# Patient Record
Sex: Male | Born: 1935 | Hispanic: No | Smoking: Former smoker
Health system: Southern US, Community
[De-identification: ages and names within clinical notes are randomized; demographics above are authoritative.]

## PROBLEM LIST (undated history)

## (undated) DIAGNOSIS — J449 Chronic obstructive pulmonary disease, unspecified: Secondary | ICD-10-CM

## (undated) DIAGNOSIS — I639 Cerebral infarction, unspecified: Secondary | ICD-10-CM

## (undated) DIAGNOSIS — I10 Essential (primary) hypertension: Secondary | ICD-10-CM

## (undated) DIAGNOSIS — I499 Cardiac arrhythmia, unspecified: Secondary | ICD-10-CM

## (undated) DIAGNOSIS — N400 Enlarged prostate without lower urinary tract symptoms: Secondary | ICD-10-CM

## (undated) DIAGNOSIS — I509 Heart failure, unspecified: Secondary | ICD-10-CM

## (undated) DIAGNOSIS — E785 Hyperlipidemia, unspecified: Secondary | ICD-10-CM

## (undated) HISTORY — DX: Cardiac arrhythmia, unspecified: I49.9

## (undated) HISTORY — DX: Chronic obstructive pulmonary disease, unspecified: J44.9

---

## 2018-11-23 ENCOUNTER — Emergency Department: Payer: Self-pay

## 2018-11-23 ENCOUNTER — Emergency Department
Admission: EM | Admit: 2018-11-23 | Discharge: 2018-11-23 | Disposition: A | Discharge status: Home / Self Care | Payer: Self-pay | Attending: Emergency Medicine | Admitting: Emergency Medicine

## 2018-11-23 ENCOUNTER — Other Ambulatory Visit: Payer: Self-pay

## 2018-11-23 DIAGNOSIS — I509 Heart failure, unspecified: Secondary | ICD-10-CM | POA: Insufficient documentation

## 2018-11-23 DIAGNOSIS — R0602 Shortness of breath: Secondary | ICD-10-CM

## 2018-11-23 LAB — CBC
HCT: 37.2 % — ABNORMAL LOW (ref 39.0–52.0)
Hemoglobin: 11.4 g/dL — ABNORMAL LOW (ref 13.0–17.0)
MCH: 25.6 pg — ABNORMAL LOW (ref 26.0–34.0)
MCHC: 30.6 g/dL (ref 30.0–36.0)
MCV: 83.6 fL (ref 80.0–100.0)
Platelets: 117 10*3/uL — ABNORMAL LOW (ref 150–400)
RBC: 4.45 MIL/uL (ref 4.22–5.81)
RDW: 14.6 % (ref 11.5–15.5)
WBC: 6.5 10*3/uL (ref 4.0–10.5)
nRBC: 0 % (ref 0.0–0.2)

## 2018-11-23 LAB — BASIC METABOLIC PANEL
Anion gap: 9 (ref 5–15)
BUN: 27 mg/dL — ABNORMAL HIGH (ref 8–23)
CO2: 32 mmol/L (ref 22–32)
Calcium: 8.8 mg/dL — ABNORMAL LOW (ref 8.9–10.3)
Chloride: 100 mmol/L (ref 98–111)
Creatinine, Ser: 1 mg/dL (ref 0.61–1.24)
GFR calc Af Amer: >60 mL/min (ref >60)
GFR calc non Af Amer: >60 mL/min (ref >60)
Glucose, Bld: 196 mg/dL — ABNORMAL HIGH (ref 70–99)
Potassium: 3.5 mmol/L (ref 3.5–5.1)
Sodium: 141 mmol/L (ref 135–145)

## 2018-11-23 LAB — BRAIN NATRIURETIC PEPTIDE: B Natriuretic Peptide: 484 pg/mL — ABNORMAL HIGH (ref 0.0–100.0)

## 2018-11-23 LAB — PROTIME-INR
INR: 1.4 — ABNORMAL HIGH (ref 0.8–1.2)
Prothrombin Time: 16.8 seconds — ABNORMAL HIGH (ref 11.4–15.2)

## 2018-11-23 LAB — TROPONIN I: Troponin I: <0.03 ng/mL (ref <0.03)

## 2018-11-23 MED ORDER — FUROSEMIDE 10 MG/ML IJ SOLN
40.0000 mg | Freq: Once | INTRAMUSCULAR | Status: AC
  Administered 2018-11-23: 21:00:00 40 mg via INTRAVENOUS
  Filled 2018-11-23: qty 4

## 2018-11-23 NOTE — ED Triage Notes (Signed)
FIRST NURSE NOTE-"full of fluid" per son. Has had SHOB and chest pain. Pt has had stroke and has difficulty speaking and speaks arabic.  Son remains with pt to help communicate.  Pulled for EKG

## 2018-11-23 NOTE — ED Provider Notes (Signed)
[facility] Emergency Department Provider Note   ____________________________________________    I have reviewed the triage vital signs and the nursing notes.   HISTORY  Chief Complaint Shortness of breath  Patient speaks Arabic, son is translating for him  HPI Brian Baker is a 83 y.o. male who presents with complaints of shortness of breath.  Son reports the patient has a history of CHF and over the last 24 hours the patient has felt like he is having difficulty breathing which is especially worse when he lies flat, he had difficulty sleeping last night because of this.  He does wear oxygen at night and supposed to use CPAP but has questionable compliance with this.  Denies fevers or chills.  No recent travel.  No body aches or myalgias.  His cardiologist is in [place]  No past medical history on file.  There are no active problems to display for this patient.     Prior to Admission medications   Not on File     Allergies Patient has no known allergies.  No family history on file.  Social History Social History   Tobacco Use  . Smoking status: Not on file  Substance Use Topics  . Alcohol use: Not on file  . Drug use: Not on file    Review of Systems  Constitutional: No fever/chills Eyes: No visual changes.  ENT: No sore throat. Cardiovascular: Denies chest pain. Respiratory: As above Gastrointestinal: No abdominal pain.  No nausea, no vomiting.   Genitourinary: Negative for dysuria. Musculoskeletal: Negative for back pain. Skin: Negative for rash. Neurological: Negative for headaches   ____________________________________________   PHYSICAL EXAM:  VITAL SIGNS: ED Triage Vitals  Enc Vitals Group     BP 11/23/18 1906 (!) 155/94     Pulse Rate 11/23/18 1906 76     Resp 11/23/18 1906 (!) 22     Temp 11/23/18 1906 97.6 F (36.4 C)     Temp Source 11/23/18 1906 Oral     SpO2 11/23/18 1906 (!) 88 %     Weight 11/23/18  1903 66.5 kg (146 lb 9.7 oz)     Height 11/23/18 1903 1.702 m (5\' 7" )     Head Circumference --      Peak Flow --      Pain Score 11/23/18 1902 0     Pain Loc --      Pain Edu? --      Excl. in GC? --     Constitutional: Alert  Eyes: Conjunctivae are normal.   Nose: No congestion/rhinnorhea. Mouth/Throat: Mucous membranes are moist.    Cardiovascular: Normal rate, regular rhythm. Grossly normal heart sounds.  Good peripheral circulation. Respiratory: Normal respiratory effort.  No retractions. Lungs CTAB. Gastrointestinal: Soft and nontender. No distention.  No CVA tenderness.  Musculoskeletal: No lower extremity tenderness nor edema.  Warm and well perfused Neurologic:  Normal speech and language. No gross focal neurologic deficits are appreciated.  Skin:  Skin is warm, dry and intact. No rash noted.   ____________________________________________   LABS (all labs ordered are listed, but only abnormal results are displayed)  Labs Reviewed  BASIC METABOLIC PANEL - Abnormal; Notable for the following components:      Result Value   Glucose, Bld 196 (*)    BUN 27 (*)    Calcium 8.8 (*)    All other components within normal limits  CBC - Abnormal; Notable for the following components:   Hemoglobin 11.4 (*)  HCT 37.2 (*)    MCH 25.6 (*)    Platelets 117 (*)    All other components within normal limits  PROTIME-INR - Abnormal; Notable for the following components:   Prothrombin Time 16.8 (*)    INR 1.4 (*)    All other components within normal limits  BRAIN NATRIURETIC PEPTIDE - Abnormal; Notable for the following components:   B Natriuretic Peptide 484.0 (*)    All other components within normal limits  TROPONIN I   ____________________________________________  EKG  ED ECG REPORT I, Jene Every, the attending physician, personally viewed and interpreted this ECG.  Date: 11/23/2018  Rhythm: normal sinus rhythm QRS Axis: normal Intervals: normal ST/T Wave  abnormalities: normal Narrative Interpretation: no evidence of acute ischemia  ____________________________________________  RADIOLOGY  X-ray mild vascular congestion ____________________________________________   PROCEDURES  Procedure(s) performed: No  Procedures   Critical Care performed: No ____________________________________________   INITIAL IMPRESSION / ASSESSMENT AND PLAN / ED COURSE  Pertinent labs & imaging results that were available during my care of the patient were reviewed by me and considered in my medical decision making (see chart for details).  Patient presents with shortness of breath, worse with lying flat in the setting of history of CHF is quite suspicious for CHF exacerbation.  Differential also includes pneumonia, novel coronavirus.  Lab work is overall reassuring, chest x-ray demonstrates mild vascular congestion.  Given shortness of breath, room air saturation of 88% and chest x-ray results I recommended IV Lasix and admission to the patient however he flatly refuses to come into the hospital.  On oxygen his pulse ox is 98%, he notes that he has oxygen at home and requests that we give him the IV Lasix now and he can always come back if his symptoms get worse.  Although this plan is suboptimal given his normal oxygen saturations on only 2 L of oxygen we will trial IV Lasix and have the patient go home with very strict return precautions     Onecimo Purchase was evaluated in Emergency Department on 11/23/2018 for the symptoms described in the history of present illness. He was evaluated in the context of the global COVID-19 pandemic, which necessitated consideration that the patient might be at risk for infection with the SARS-CoV-2 virus that causes COVID-19. Institutional protocols and algorithms that pertain to the evaluation of patients at risk for COVID-19 are in a state of rapid change based on information released by regulatory bodies including the CDC and  federal and state organizations. These policies and algorithms were followed during the patient's care in the ED.     ____________________________________________   FINAL CLINICAL IMPRESSION(S) / ED DIAGNOSES  Final diagnoses:  Acute on chronic congestive heart failure, unspecified heart failure type [facility])        Note:  This document was prepared using Dragon voice recognition software and may include unintentional dictation errors.   Jene Every, MD 11/23/18 2154

## 2018-11-23 NOTE — ED Triage Notes (Signed)
Son reports pt unable to sleep last night due liquid in his lungs, states that this has happened before states all day today he has been having bleeding from his rectum and this is new

## 2019-01-24 ENCOUNTER — Inpatient Hospital Stay
Admission: EM | Admit: 2019-01-24 | Discharge: 2019-01-25 | DRG: 292 | Disposition: A | Discharge status: Home / Self Care | Payer: Medicaid Other | Attending: Internal Medicine | Admitting: Internal Medicine

## 2019-01-24 ENCOUNTER — Emergency Department: Payer: Medicaid Other

## 2019-01-24 ENCOUNTER — Other Ambulatory Visit: Payer: Self-pay

## 2019-01-24 DIAGNOSIS — I160 Hypertensive urgency: Secondary | ICD-10-CM | POA: Diagnosis present

## 2019-01-24 DIAGNOSIS — I081 Rheumatic disorders of both mitral and tricuspid valves: Secondary | ICD-10-CM | POA: Diagnosis present

## 2019-01-24 DIAGNOSIS — D649 Anemia, unspecified: Secondary | ICD-10-CM | POA: Diagnosis present

## 2019-01-24 DIAGNOSIS — E876 Hypokalemia: Secondary | ICD-10-CM | POA: Diagnosis present

## 2019-01-24 DIAGNOSIS — I6310 Cerebral infarction due to embolism of unspecified precerebral artery: Secondary | ICD-10-CM | POA: Diagnosis not present

## 2019-01-24 DIAGNOSIS — I444 Left anterior fascicular block: Secondary | ICD-10-CM | POA: Diagnosis present

## 2019-01-24 DIAGNOSIS — R479 Unspecified speech disturbances: Secondary | ICD-10-CM | POA: Diagnosis not present

## 2019-01-24 DIAGNOSIS — Z7901 Long term (current) use of anticoagulants: Secondary | ICD-10-CM

## 2019-01-24 DIAGNOSIS — I361 Nonrheumatic tricuspid (valve) insufficiency: Secondary | ICD-10-CM | POA: Diagnosis not present

## 2019-01-24 DIAGNOSIS — K219 Gastro-esophageal reflux disease without esophagitis: Secondary | ICD-10-CM | POA: Diagnosis present

## 2019-01-24 DIAGNOSIS — I4821 Permanent atrial fibrillation: Secondary | ICD-10-CM | POA: Diagnosis not present

## 2019-01-24 DIAGNOSIS — I5043 Acute on chronic combined systolic (congestive) and diastolic (congestive) heart failure: Secondary | ICD-10-CM | POA: Diagnosis present

## 2019-01-24 DIAGNOSIS — I420 Dilated cardiomyopathy: Secondary | ICD-10-CM | POA: Diagnosis not present

## 2019-01-24 DIAGNOSIS — I110 Hypertensive heart disease with heart failure: Secondary | ICD-10-CM | POA: Diagnosis not present

## 2019-01-24 DIAGNOSIS — I25118 Atherosclerotic heart disease of native coronary artery with other forms of angina pectoris: Secondary | ICD-10-CM | POA: Diagnosis not present

## 2019-01-24 DIAGNOSIS — Z87891 Personal history of nicotine dependence: Secondary | ICD-10-CM

## 2019-01-24 DIAGNOSIS — Z8673 Personal history of transient ischemic attack (TIA), and cerebral infarction without residual deficits: Secondary | ICD-10-CM

## 2019-01-24 DIAGNOSIS — G9389 Other specified disorders of brain: Secondary | ICD-10-CM | POA: Diagnosis present

## 2019-01-24 DIAGNOSIS — I272 Pulmonary hypertension, unspecified: Secondary | ICD-10-CM | POA: Diagnosis present

## 2019-01-24 DIAGNOSIS — Z20828 Contact with and (suspected) exposure to other viral communicable diseases: Secondary | ICD-10-CM | POA: Diagnosis present

## 2019-01-24 DIAGNOSIS — N400 Enlarged prostate without lower urinary tract symptoms: Secondary | ICD-10-CM | POA: Diagnosis present

## 2019-01-24 DIAGNOSIS — I509 Heart failure, unspecified: Secondary | ICD-10-CM

## 2019-01-24 DIAGNOSIS — E785 Hyperlipidemia, unspecified: Secondary | ICD-10-CM | POA: Diagnosis present

## 2019-01-24 DIAGNOSIS — R778 Other specified abnormalities of plasma proteins: Secondary | ICD-10-CM

## 2019-01-24 DIAGNOSIS — I10 Essential (primary) hypertension: Secondary | ICD-10-CM | POA: Diagnosis present

## 2019-01-24 DIAGNOSIS — I340 Nonrheumatic mitral (valve) insufficiency: Secondary | ICD-10-CM | POA: Diagnosis not present

## 2019-01-24 HISTORY — DX: Essential (primary) hypertension: I10

## 2019-01-24 HISTORY — DX: Cerebral infarction, unspecified: I63.9

## 2019-01-24 HISTORY — DX: Benign prostatic hyperplasia without lower urinary tract symptoms: N40.0

## 2019-01-24 HISTORY — DX: Hyperlipidemia, unspecified: E78.5

## 2019-01-24 HISTORY — DX: Heart failure, unspecified: I50.9

## 2019-01-24 LAB — BASIC METABOLIC PANEL
Anion gap: 9 (ref 5–15)
BUN: 26 mg/dL — ABNORMAL HIGH (ref 8–23)
CO2: 29 mmol/L (ref 22–32)
Calcium: 9.1 mg/dL (ref 8.9–10.3)
Chloride: 105 mmol/L (ref 98–111)
Creatinine, Ser: 1.07 mg/dL (ref 0.61–1.24)
GFR calc Af Amer: >60 mL/min (ref >60)
GFR calc non Af Amer: >60 mL/min (ref >60)
Glucose, Bld: 163 mg/dL — ABNORMAL HIGH (ref 70–99)
Potassium: 3.6 mmol/L (ref 3.5–5.1)
Sodium: 143 mmol/L (ref 135–145)

## 2019-01-24 LAB — BRAIN NATRIURETIC PEPTIDE: B Natriuretic Peptide: 955 pg/mL — ABNORMAL HIGH (ref 0.0–100.0)

## 2019-01-24 LAB — CBC
HCT: 39.2 % (ref 39.0–52.0)
Hemoglobin: 12.3 g/dL — ABNORMAL LOW (ref 13.0–17.0)
MCH: 26.1 pg (ref 26.0–34.0)
MCHC: 31.4 g/dL (ref 30.0–36.0)
MCV: 83.2 fL (ref 80.0–100.0)
Platelets: 90 10*3/uL — ABNORMAL LOW (ref 150–400)
RBC: 4.71 MIL/uL (ref 4.22–5.81)
RDW: 15.9 % — ABNORMAL HIGH (ref 11.5–15.5)
WBC: 7.4 10*3/uL (ref 4.0–10.5)
nRBC: 0 % (ref 0.0–0.2)

## 2019-01-24 LAB — TROPONIN I: Troponin I: 0.07 ng/mL (ref <0.03)

## 2019-01-24 MED ORDER — ENOXAPARIN SODIUM 40 MG/0.4ML ~~LOC~~ SOLN: 40.0000 mg | SUBCUTANEOUS | Status: DC

## 2019-01-24 MED ORDER — NITROGLYCERIN 2 % TD OINT
0.5000 [in_us] | TOPICAL_OINTMENT | Freq: Four times a day (QID) | TRANSDERMAL | Status: DC
  Administered 2019-01-24 – 2019-01-25 (×3): 0.5 [in_us] via TOPICAL
  Filled 2019-01-24 (×3): qty 1

## 2019-01-24 MED ORDER — SODIUM CHLORIDE 0.9 % IV SOLN: 250.0000 mL | INTRAVENOUS | Status: DC | PRN

## 2019-01-24 MED ORDER — ASPIRIN EC 81 MG PO TBEC
81.0000 mg | DELAYED_RELEASE_TABLET | Freq: Every day | ORAL | Status: DC
  Filled 2019-01-24: qty 1

## 2019-01-24 MED ORDER — ZOLPIDEM TARTRATE 5 MG PO TABS: 5.0000 mg | ORAL_TABLET | Freq: Every evening | ORAL | Status: DC | PRN

## 2019-01-24 MED ORDER — SODIUM CHLORIDE 0.9% FLUSH: 3.0000 mL | INTRAVENOUS | Status: DC | PRN

## 2019-01-24 MED ORDER — ACETAMINOPHEN 325 MG PO TABS: 650.0000 mg | ORAL_TABLET | ORAL | Status: DC | PRN

## 2019-01-24 MED ORDER — POTASSIUM CHLORIDE 20 MEQ PO PACK
40.0000 meq | PACK | Freq: Once | ORAL | Status: AC
  Administered 2019-01-25: 40 meq via ORAL
  Filled 2019-01-24: qty 2

## 2019-01-24 MED ORDER — ASPIRIN 81 MG PO CHEW
324.0000 mg | CHEWABLE_TABLET | Freq: Once | ORAL | Status: AC
  Administered 2019-01-24: 23:00:00 324 mg via ORAL
  Filled 2019-01-24: qty 4

## 2019-01-24 MED ORDER — FUROSEMIDE 10 MG/ML IJ SOLN
40.0000 mg | Freq: Two times a day (BID) | INTRAMUSCULAR | Status: DC
  Administered 2019-01-25 (×2): 40 mg via INTRAVENOUS
  Filled 2019-01-24: qty 4

## 2019-01-24 MED ORDER — SODIUM CHLORIDE 0.9% FLUSH
3.0000 mL | Freq: Two times a day (BID) | INTRAVENOUS | Status: DC
  Administered 2019-01-25: 3 mL via INTRAVENOUS

## 2019-01-24 MED ORDER — ONDANSETRON HCL 4 MG/2ML IJ SOLN: 4.0000 mg | Freq: Four times a day (QID) | INTRAMUSCULAR | Status: DC | PRN

## 2019-01-24 MED ORDER — FUROSEMIDE 10 MG/ML IJ SOLN
40.0000 mg | Freq: Once | INTRAMUSCULAR | Status: DC
  Filled 2019-01-24: qty 4

## 2019-01-24 NOTE — ED Notes (Signed)
Patient to room 5 with daughter in law. Patient is from [place]. Patient has history of cardiac issues with having heart attack three years ago prior to coming to [place]. Patient states having headache x 3 days and just started having chest pain and some nausea. Patient on 2 liter O2 and same at home.

## 2019-01-24 NOTE — ED Provider Notes (Signed)
[facility] Emergency Department Provider Note ___   First MD Initiated Contact with Patient 01/24/19 2324     (approximate)  I have reviewed the triage vital signs and the nursing notes.  History obtained from the patient's son who interpreted. HISTORY  Chief Complaint Hypertension   HPI Brian Baker is a 83 y.o. male with below list of previous medical conditions including hypertension and CHF presents to the emergency department with progressive dyspnea and chest pain lightheadedness x3 days.  Patient's son also stated that patient has noted hypertension at home.  Patient's blood pressure on arrival to the emergency department 189/139.  Patient with ongoing chest discomfort and dyspnea.  Patient's and also admits that the patient has had orthopnea.        Past Medical History:  Diagnosis Date  . CHF (congestive heart failure) (HCC)   . Enlarged prostate   . Hyperlipemia   . Hypertension   . Stroke [facility])     There are no active problems to display for this patient.   History reviewed. No pertinent surgical history.  Prior to Admission medications   Medication Sig Start Date End Date Taking? Authorizing Provider  albuterol (VENTOLIN HFA) 108 (90 Base) MCG/ACT inhaler Inhale 1-2 puffs into the lungs every 4 (four) hours as needed.  05/27/18 05/27/19 Yes [provider]  ELIQUIS 5 MG TABS tablet TK 1 T PO DAILY 01/09/19   [provider]  finasteride (PROSCAR) 5 MG tablet TK 1 T PO Q 24 H 12/02/18   [provider]  furosemide (LASIX) 40 MG tablet Take 40 mg by mouth daily.  11/26/18   [provider]  lisinopril (ZESTRIL) 10 MG tablet TK 1 T PO  D 12/27/18   [provider]  metoprolol tartrate (LOPRESSOR) 25 MG tablet Take 25 mg by mouth 2 (two) times daily.  12/25/18   [provider]  omeprazole (PRILOSEC) 20 MG capsule Take 20 mg by mouth daily.  12/25/18   [provider]  pravastatin  (PRAVACHOL) 40 MG tablet TK 1 T PO HS 01/04/19   [provider]  tamsulosin (FLOMAX) 0.4 MG CAPS capsule TK ONE C PO QD 10/29/18   [provider]    Allergies Patient has no known allergies.  No family history on file.  Social History Social History   Tobacco Use  . Smoking status: Former Games developer  . Smokeless tobacco: Never Used  Substance Use Topics  . Alcohol use: Not Currently  . Drug use: Not on file    Review of Systems Constitutional: No fever/chills Eyes: No visual changes. ENT: No sore throat. Cardiovascular: Positive for chest pain. Respiratory: Positive for shortness of breath. Gastrointestinal: No abdominal pain.  No nausea, no vomiting.  No diarrhea.  No constipation. Genitourinary: Negative for dysuria. Musculoskeletal: Negative for neck pain.  Negative for back pain. Integumentary: Negative for rash. Neurological: Negative for headaches, focal weakness or numbness.   ____________________________________________   PHYSICAL EXAM:  VITAL SIGNS: ED Triage Vitals  Enc Vitals Group     BP 01/24/19 2141 (!) 189/139     Pulse Rate 01/24/19 2141 100     Resp 01/24/19 2141 19     Temp 01/24/19 2141 98 F (36.7 C)     Temp src --      SpO2 01/24/19 2141 (!) 87 %     Weight 01/24/19 2142 67.1 kg (148 lb)     Height 01/24/19 2142 1.651 m (5\' 5" )  Head Circumference --      Peak Flow --      Pain Score 01/24/19 2142 6     Pain Loc --      Pain Edu? --      Excl. in GC? --     Constitutional: Alert and oriented. Well appearing and in no acute distress. Eyes: Conjunctivae are normal.  Mouth/Throat: Mucous membranes are moist.  Oropharynx non-erythematous. Neck: No stridor. Cardiovascular: Normal rate, regular rhythm. Good peripheral circulation. Grossly normal heart sounds. Respiratory: Tachypnea.  No retractions.  Bibasilar rales Gastrointestinal: Soft and nontender. No distention.  Musculoskeletal: No lower extremity tenderness nor  edema. No gross deformities of extremities. Neurologic:  Normal speech and language. No gross focal neurologic deficits are appreciated.  Skin:  Skin is warm, dry and intact. No rash noted. Psychiatric: Mood and affect are normal. Speech and behavior are normal.  ____________________________________________   LABS (all labs ordered are listed, but only abnormal results are displayed)  Labs Reviewed  BASIC METABOLIC PANEL - Abnormal; Notable for the following components:      Result Value   Glucose, Bld 163 (*)    BUN 26 (*)    All other components within normal limits  CBC - Abnormal; Notable for the following components:   Hemoglobin 12.3 (*)    RDW 15.9 (*)    Platelets 90 (*)    All other components within normal limits  TROPONIN I - Abnormal; Notable for the following components:   Troponin I 0.07 (*)    All other components within normal limits  BRAIN NATRIURETIC PEPTIDE - Abnormal; Notable for the following components:   B Natriuretic Peptide 955.0 (*)    All other components within normal limits   ____________________________________________  EKG  ED ECG REPORT I, Mesilla N Abed Schar, the attending physician, personally viewed and interpreted this ECG.   Date: 01/24/2019  EKG Time: 9:46 PM  Rate: 95  Rhythm: Atrial fibrillation  Axis: Normal  Intervals: Irregular RR interval  ST&T Change: None  ____________________________________________  RADIOLOGY I, Burley N Blaine Guiffre, personally viewed and evaluated these images (plain radiographs) as part of my medical decision making, as well as reviewing the written report by the radiologist.  ED MD interpretation: Cardiomegaly with vascular congestion and small left pleural effusion   Official radiology report(s): Dg Chest Port 1 View  Result Date: 01/24/2019 CLINICAL DATA:  83 year old male with CHF. EXAM: PORTABLE CHEST 1 VIEW COMPARISON:  Chest radiograph dated 11/23/2018 FINDINGS: There is cardiomegaly with vascular  congestion. A small left pleural effusion may be present. No focal consolidation or pneumothorax. Atherosclerotic calcification of the aortic arch. No acute osseous pathology. IMPRESSION: Cardiomegaly with vascular congestion and possible small left pleural effusion. No significant interval change since the prior radiograph. Electronically Signed   By: Elgie CollardArash  Radparvar M.D.   On: 01/24/2019 23:04    .Critical Care Performed by: Darci CurrentBrown, [place] N, MD Authorized by: Darci CurrentBrown, [place] N, MD   Critical care provider statement:    Critical care time (minutes):  30   Critical care time was exclusive of:  Separately billable procedures and treating other patients   Critical care was necessary to treat or prevent imminent or life-threatening deterioration of the following conditions:  Cardiac failure   Critical care was time spent personally by me on the following activities:  Development of treatment plan with patient or surrogate, discussions with consultants, evaluation of patient's response to treatment, examination of patient, obtaining history from patient or surrogate, ordering  and performing treatments and interventions, ordering and review of laboratory studies, ordering and review of radiographic studies, pulse oximetry, re-evaluation of patient's condition and review of old charts   I assumed direction of critical care for this patient from another provider in my specialty: no       ____________________________________________   INITIAL IMPRESSION / MDM / ASSESSMENT AND PLAN / ED COURSE  As part of my medical decision making, I reviewed the following data within the electronic MEDICAL RECORD NUMBER   83 year old male presenting with above-stated history and physical exam concerning for hypertensive emergency with CHF exacerbation.  Half inch Nitropaste applied to the patient's chest Lasix 40 mg IV administered. Patient discussed with Dr. Arville Care for hospital admission for further evaluation and  management of hypertensive emergency/CHF exacerbation.     *Brian Baker was evaluated in Emergency Department on 01/24/2019 for the symptoms described in the history of present illness. He was evaluated in the context of the global COVID-19 pandemic, which necessitated consideration that the patient might be at risk for infection with the SARS-CoV-2 virus that causes COVID-19. Institutional protocols and algorithms that pertain to the evaluation of patients at risk for COVID-19 are in a state of rapid change based on information released by regulatory bodies including the CDC and federal and state organizations. These policies and algorithms were followed during the patient's care in the ED.  Some ED evaluations and interventions may be delayed as a result of limited staffing during the pandemic.*   ____________________________________________  FINAL CLINICAL IMPRESSION(S) / ED DIAGNOSES  Final diagnoses:  Acute on chronic congestive heart failure, unspecified heart failure type (HCC)  Elevated troponin  Malignant hypertension     MEDICATIONS GIVEN DURING THIS VISIT:  Medications  nitroGLYCERIN (NITROGLYN) 2 % ointment 0.5 inch (0.5 inches Topical Given 01/24/19 2321)  furosemide (LASIX) injection 40 mg (has no administration in time range)  aspirin chewable tablet 324 mg (324 mg Oral Given 01/24/19 2320)     ED Discharge Orders    None       Note:  This document was prepared using Dragon voice recognition software and may include unintentional dictation errors.   Darci Current, MD 01/25/19 [phone]

## 2019-01-24 NOTE — ED Triage Notes (Signed)
Patient c/o headache, hypertension ( Systolic BP 180), lightheadedness X 3 days.

## 2019-01-25 ENCOUNTER — Inpatient Hospital Stay: Payer: Medicaid Other

## 2019-01-25 ENCOUNTER — Inpatient Hospital Stay (HOSPITAL_COMMUNITY)
Admit: 2019-01-25 | Discharge: 2019-01-25 | Disposition: A | Discharge status: Home / Self Care | Payer: Medicaid Other | Attending: Family Medicine | Admitting: Family Medicine

## 2019-01-25 ENCOUNTER — Encounter: Payer: Self-pay | Admitting: Physician Assistant

## 2019-01-25 DIAGNOSIS — I420 Dilated cardiomyopathy: Secondary | ICD-10-CM

## 2019-01-25 DIAGNOSIS — R479 Unspecified speech disturbances: Secondary | ICD-10-CM

## 2019-01-25 DIAGNOSIS — I340 Nonrheumatic mitral (valve) insufficiency: Secondary | ICD-10-CM

## 2019-01-25 DIAGNOSIS — I361 Nonrheumatic tricuspid (valve) insufficiency: Secondary | ICD-10-CM

## 2019-01-25 DIAGNOSIS — I6310 Cerebral infarction due to embolism of unspecified precerebral artery: Secondary | ICD-10-CM

## 2019-01-25 DIAGNOSIS — I110 Hypertensive heart disease with heart failure: Principal | ICD-10-CM

## 2019-01-25 DIAGNOSIS — I25118 Atherosclerotic heart disease of native coronary artery with other forms of angina pectoris: Secondary | ICD-10-CM

## 2019-01-25 DIAGNOSIS — I5043 Acute on chronic combined systolic (congestive) and diastolic (congestive) heart failure: Secondary | ICD-10-CM

## 2019-01-25 DIAGNOSIS — I4821 Permanent atrial fibrillation: Secondary | ICD-10-CM

## 2019-01-25 LAB — BASIC METABOLIC PANEL
Anion gap: 10 (ref 5–15)
BUN: 24 mg/dL — ABNORMAL HIGH (ref 8–23)
CO2: 29 mmol/L (ref 22–32)
Calcium: 8.7 mg/dL — ABNORMAL LOW (ref 8.9–10.3)
Chloride: 104 mmol/L (ref 98–111)
Creatinine, Ser: 0.92 mg/dL (ref 0.61–1.24)
GFR calc Af Amer: >60 mL/min (ref >60)
GFR calc non Af Amer: >60 mL/min (ref >60)
Glucose, Bld: 173 mg/dL — ABNORMAL HIGH (ref 70–99)
Potassium: 2.7 mmol/L — CL (ref 3.5–5.1)
Sodium: 143 mmol/L (ref 135–145)

## 2019-01-25 LAB — GLUCOSE, CAPILLARY: Glucose-Capillary: 196 mg/dL — ABNORMAL HIGH (ref 70–99)

## 2019-01-25 LAB — CBC WITH DIFFERENTIAL/PLATELET
Abs Immature Granulocytes: 0.03 10*3/uL (ref 0.00–0.07)
Basophils Absolute: 0 10*3/uL (ref 0.0–0.1)
Basophils Relative: 1 %
Eosinophils Absolute: 0.1 10*3/uL (ref 0.0–0.5)
Eosinophils Relative: 2 %
HCT: 35.9 % — ABNORMAL LOW (ref 39.0–52.0)
Hemoglobin: 11.1 g/dL — ABNORMAL LOW (ref 13.0–17.0)
Immature Granulocytes: 1 %
Lymphocytes Relative: 17 %
Lymphs Abs: 1.1 10*3/uL (ref 0.7–4.0)
MCH: 25.5 pg — ABNORMAL LOW (ref 26.0–34.0)
MCHC: 30.9 g/dL (ref 30.0–36.0)
MCV: 82.5 fL (ref 80.0–100.0)
Monocytes Absolute: 0.6 10*3/uL (ref 0.1–1.0)
Monocytes Relative: 9 %
Neutro Abs: 4.6 10*3/uL (ref 1.7–7.7)
Neutrophils Relative %: 70 %
Platelets: 90 10*3/uL — ABNORMAL LOW (ref 150–400)
RBC: 4.35 MIL/uL (ref 4.22–5.81)
RDW: 15.7 % — ABNORMAL HIGH (ref 11.5–15.5)
WBC: 6.4 10*3/uL (ref 4.0–10.5)
nRBC: 0 % (ref 0.0–0.2)

## 2019-01-25 LAB — MAGNESIUM: Magnesium: 1.8 mg/dL (ref 1.7–2.4)

## 2019-01-25 LAB — ECHOCARDIOGRAM COMPLETE
Height: 65 in
Weight: 2376 oz

## 2019-01-25 LAB — SARS CORONAVIRUS 2 BY RT PCR (HOSPITAL ORDER, PERFORMED IN ~~LOC~~ HOSPITAL LAB): SARS Coronavirus 2: NEGATIVE

## 2019-01-25 MED ORDER — TAMSULOSIN HCL 0.4 MG PO CAPS
0.4000 mg | ORAL_CAPSULE | Freq: Every day | ORAL | Status: DC
  Filled 2019-01-25: qty 1

## 2019-01-25 MED ORDER — LISINOPRIL 5 MG PO TABS
10.0000 mg | ORAL_TABLET | Freq: Every day | ORAL | Status: DC
  Filled 2019-01-25: qty 1

## 2019-01-25 MED ORDER — PRAVASTATIN SODIUM 20 MG PO TABS: 40.0000 mg | ORAL_TABLET | Freq: Every day | ORAL | Status: DC

## 2019-01-25 MED ORDER — LABETALOL HCL 5 MG/ML IV SOLN
20.0000 mg | INTRAVENOUS | Status: DC | PRN
  Administered 2019-01-25: 20 mg via INTRAVENOUS
  Filled 2019-01-25: qty 4

## 2019-01-25 MED ORDER — METOPROLOL TARTRATE 25 MG PO TABS: 25.0000 mg | ORAL_TABLET | Freq: Once | ORAL | Status: DC

## 2019-01-25 MED ORDER — PANTOPRAZOLE SODIUM 40 MG PO TBEC
40.0000 mg | DELAYED_RELEASE_TABLET | Freq: Every day | ORAL | Status: DC
  Filled 2019-01-25: qty 1

## 2019-01-25 MED ORDER — HYDRALAZINE HCL 20 MG/ML IJ SOLN: 10.0000 mg | Freq: Four times a day (QID) | INTRAMUSCULAR | Status: DC | PRN

## 2019-01-25 MED ORDER — APIXABAN 2.5 MG PO TABS
2.5000 mg | ORAL_TABLET | Freq: Two times a day (BID) | ORAL | Status: DC
  Filled 2019-01-25: qty 1

## 2019-01-25 MED ORDER — IOHEXOL 350 MG/ML SOLN
75.0000 mL | Freq: Once | INTRAVENOUS | Status: AC | PRN
  Administered 2019-01-25: 75 mL via INTRAVENOUS

## 2019-01-25 MED ORDER — METOPROLOL TARTRATE 25 MG PO TABS
25.0000 mg | ORAL_TABLET | Freq: Two times a day (BID) | ORAL | Status: DC
  Filled 2019-01-25 (×2): qty 1

## 2019-01-25 MED ORDER — ALBUTEROL SULFATE (2.5 MG/3ML) 0.083% IN NEBU: 2.5000 mg | INHALATION_SOLUTION | RESPIRATORY_TRACT | Status: DC | PRN

## 2019-01-25 MED ORDER — POTASSIUM CHLORIDE CRYS ER 20 MEQ PO TBCR
40.0000 meq | EXTENDED_RELEASE_TABLET | ORAL | Status: AC
  Administered 2019-01-25: 40 meq via ORAL
  Filled 2019-01-25: qty 2

## 2019-01-25 MED ORDER — FINASTERIDE 5 MG PO TABS
5.0000 mg | ORAL_TABLET | Freq: Every day | ORAL | Status: DC
  Filled 2019-01-25: qty 1

## 2019-01-25 NOTE — Consult Note (Signed)
Cardiology Consultation:   Patient ID: Brian Baker MRN: [id number]; DOB: June 17, 1936  Admit date: 01/24/2019 Date of Consult: 01/25/2019  Primary Care Provider: [place], [place] [facility] [place] Primary Cardiologist: [facility], Dr. Lynnea Ferrier Primary Electrophysiologist:  None    Patient Profile:   Brian Baker is a 83 y.o. male with a hx of permanent atrial fibrillation with RVR, CVA (~ 2018 per [facility]), HTN, HLD, chronic combined systolic and diastolic congestive heart failure (EF 50%), moderate MR, former smoker (40pk year history, quit 1995), COPD, and R eye enucleation, GERD, and who is being seen today for the evaluation of possible acute on chronic HFpEF at the request of Dr. Arville Care.  History of Present Illness:   Brian Baker is an 83 yo male with PMH as above.  He has been followed by [facility] / Dr. Lynnea Ferrier with most recent visit to cardiologist 12/25/2018.  He is reportedly a refugee from [place] and moved to [place] for 5 years before he came to the [place] 10/2016.  Per daughter in law, h/o heart attack three years before coming to [place]; however, unable to obtain confirmation of this event at this time.  He also has a known history of CVA dating back to 2018 per review of EMR.  Past surgical history was notable for right eye enucleation of unknown date but notable on physical exam. Also of note, patient requires a Nurse, learning disability.  He was admitted 05/24/2018-05/27/2018 with acute on chronic combined systolic and diastolic heart failure and treated with IV diuretics.  On review of notes, it was thought that he may had underlying COPD at that time, for which he was referred to pulmonology and started on home oxygen.  His diltiazem CD 180 mg daily was discontinued during the admission d/t bradycardic rates and his previous metoprolol changed to Coreg. Echo done at that time (05/26/2018 echo) showed EF low normal at 50%, low normal LV wall motion globally, abnormal diastolic filling pattern consistent with Afib,  severe LAE, mild RAE, moderate MR, mild dilation of the ascending aorta at 3.57cm, and possibly small size of IVC.   As above, he follows with Dr. Lynnea Ferrier of [facility] with most recent follow-up on 12/25/2018. At this visit, it was noted that he was back on his metoprolol tartrate, which was then  increased to metoprolol tartrate 37.5mg  BID for more optimal rate control in the setting of permanent atrial fibrillation. He had not restarted the diltiazem CD 180mg , previously discontinued as above. Recommendations were to continue medication management with his increased metoprolol tartrate 37.5mg  po BID (1.5 tablets twice daily), Eliquis (documented as both 5mg  and 2.5 mg BID under his medications), Lasix 40 mg BID and at 8 AM and 2 PM, lisinopril 10 mg daily, pravastatin 40 mg po each night. Weight at that time was noted to be 135lb (61.3kg).  Of note, the HPI below was obtained through a combination of review of EMR, an interpreter, and family. The patient has been unwilling to answer questions or provide any information regarding his history since in the ED (informed the interpreter he did not want to answer questions at this time). Family has been contacted and some information provided so far by the daughter in law.  Per the patient's daughter-in-law, over the last 3-4 days, the patient had been complaining of chest pain, increasing SOB/DOE, HA, light-headedness, and nausea. On 6/4, he presented to [facility] ED. No reported CP, palpitations, or racing HR. No recent fevers or chills. She also reported bilateral ankle and  pedal edema, however. In the ED, BP 189/139, ventricular rate 100 bpm, SpO2 87% ORA (uses home oxygen per EMR). Labs showed troponin 0.07, Hgb 12.3, K 3.6. BNP 955.0. COVID-19 negative. EKG showed known atrial fibrillation with ventricular rate 95 bpm and no acute changes. CXR showed vascular congestion with possible small L pleural effusion. Physical exam without neuro changes. He was  started on IV lasix  and ASA with nitropaste then admitted for further evaluation and management.  Of note, shortly after today's 6/5 cardiac consultation, this patient was transferred to the ICU. On review of EMR, stroke protocol was initiated d/t RN concern for acute CVA as the patient would not communicate with her and she also noted concern for facial asymmetry (as above, known R eye enucleation). Neuro consulted with workup showing no acute findings and known previous CVA. Family also confirmed patient is at his baseline.  Past Medical History:  Diagnosis Date  . CHF (congestive heart failure) (HCC)   . Enlarged prostate   . Hyperlipemia   . Hypertension   . Stroke [facility])     History reviewed. No pertinent surgical history.   Home Medications:  Prior to Admission medications   Medication Sig Start Date End Date Taking? Authorizing Provider  albuterol (VENTOLIN HFA) 108 (90 Base) MCG/ACT inhaler Inhale 1-2 puffs into the lungs every 4 (four) hours as needed.  05/27/18 05/27/19 Yes [provider]  ELIQUIS 5 MG TABS tablet Take 2.5 mg by mouth 2 (two) times daily.  01/09/19  Yes [provider]  finasteride (PROSCAR) 5 MG tablet TK 1 T PO Q 24 H 12/02/18  Yes [provider]  furosemide (LASIX) 40 MG tablet Take 40 mg by mouth daily.  11/26/18  Yes [provider]  lisinopril (ZESTRIL) 10 MG tablet TK 1 T PO  D 12/27/18  Yes [provider]  pravastatin (PRAVACHOL) 40 MG tablet TK 1 T PO HS 01/04/19  Yes [provider]  tamsulosin (FLOMAX) 0.4 MG CAPS capsule TK ONE C PO QD 10/29/18  Yes [provider]  metoprolol tartrate (LOPRESSOR) 25 MG tablet Take 25 mg by mouth 2 (two) times daily.  12/25/18   [provider]  omeprazole (PRILOSEC) 20 MG capsule Take 20 mg by mouth daily.  12/25/18   [provider]    Inpatient Medications: Scheduled Meds: . apixaban  2.5 mg Oral BID  . aspirin EC  81 mg Oral Daily  .  finasteride  5 mg Oral Daily  . furosemide  40 mg Intravenous Once  . furosemide  40 mg Intravenous Q12H  . lisinopril  10 mg Oral Daily  . metoprolol tartrate  25 mg Oral BID  . nitroGLYCERIN  0.5 inch Topical Q6H  . pantoprazole  40 mg Oral Daily  . potassium chloride  40 mEq Oral Q2H  . pravastatin  40 mg Oral Daily  . sodium chloride flush  3 mL Intravenous Q12H  . tamsulosin  0.4 mg Oral Daily   Continuous Infusions: . sodium chloride     PRN Meds: sodium chloride, acetaminophen, albuterol, hydrALAZINE, labetalol, ondansetron (ZOFRAN) IV, sodium chloride flush, zolpidem  Allergies:   No Known Allergies  Social History:   Social History   Socioeconomic History  . Marital status: Widowed    Spouse name: Not on file  . Number of children: Not on file  . Years of education: Not on file  . Highest education level: Not on file  Occupational History  . Not  on file  Social Needs  . Financial resource strain: Not on file  . Food insecurity:    Worry: Not on file    Inability: Not on file  . Transportation needs:    Medical: Not on file    Non-medical: Not on file  Tobacco Use  . Smoking status: Former Games developermoker  . Smokeless tobacco: Never Used  Substance and Sexual Activity  . Alcohol use: Not Currently  . Drug use: Not on file  . Sexual activity: Not on file  Lifestyle  . Physical activity:    Days per week: Not on file    Minutes per session: Not on file  . Stress: Not on file  Relationships  . Social connections:    Talks on phone: Not on file    Gets together: Not on file    Attends religious service: Not on file    Active member of club or organization: Not on file    Attends meetings of clubs or organizations: Not on file    Relationship status: Not on file  . Intimate partner violence:    Fear of current or ex partner: Not on file    Emotionally abused: Not on file    Physically abused: Not on file    Forced sexual activity: Not on file  Other Topics  Concern  . Not on file  Social History Narrative  . Not on file    Family History:   History reviewed. No pertinent family history.  No known cardiac family history.  ROS:  Please see the history of present illness.  Review of Systems  Constitutional: Negative for chills and fever.  Respiratory: Positive for cough and shortness of breath.        Cough d/t GERD and improving after PPI prescribed by his primary cardiologist  Cardiovascular: Positive for leg swelling. Negative for chest pain.  Gastrointestinal: Positive for heartburn and nausea.  Neurological: Positive for dizziness and headaches.  All other systems reviewed and are negative.   All other ROS reviewed and negative.     Physical Exam/Data:   Vitals:   01/25/19 0136 01/25/19 0329 01/25/19 0400 01/25/19 0807  BP: (!) 157/111 (!) 166/106 134/87 (!) 147/98  Pulse: 75 96 77 77  Resp:  17  16  Temp:  98.3 F (36.8 C)  (!) 97.5 F (36.4 C)  TempSrc:  Oral  Oral  SpO2:  93%  93%  Weight: 67.4 kg     Height: 5\' 5"  (1.651 m)       Intake/Output Summary (Last 24 hours) at 01/25/2019 0840 Last data filed at 01/25/2019 0600 Gross per 24 hour  Intake -  Output 650 ml  Net -650 ml   Filed Weights   01/24/19 2142 01/25/19 0136  Weight: 67.1 kg 67.4 kg   Body mass index is 24.71 kg/m.  General:  Well nourished, well developed, in no acute distress HEENT: normal. Known R eye enucleation  Neck: +JVD, JVP ~12cm Vascular: No carotid bruits; Radial pulses 2+ bilaterally  Cardiac:  IRIR; 1/6 systolic murmur Lungs:  Reduced bibasilar breath sounds with trace bibasilar crackles Abd: soft, nontender, no hepatomegaly  Ext: Minimal bilateral lower extremity edema Musculoskeletal:  No deformities, BUE and BLE strength normal and equal Skin: warm and dry  Neuro:  CNs 2-12 intact, no focal abnormalities noted that were not present at baseline Psych:  Normal affect   EKG:  The EKG was personally reviewed and demonstrates:  Atrial fibrillation Telemetry:  Telemetry  was personally reviewed and demonstrates: IRIR  Relevant CV Studies:  TTE 01/25/2019 The left ventricle has moderately reduced systolic function, with an ejection fraction of 35-40%. The cavity size was normal. There is moderately increased left ventricular wall thickness. Left ventricular diastolic Doppler parameters are  indeterminate.Global hypokinesis, select images suggests significant inferior wall hypokinesis.  2. The right ventricle has mildly reduced systolic function. The cavity was normal. There is no increase in right ventricular wall thickness. Right ventricular systolic pressure is severely elevated with an estimated pressure of 62.9 mmHg.  3. Left atrial size was severely dilated.  4. Right atrial size was moderately dilated.  5. Mitral valve regurgitation is mild to moderate  6. Tricuspid valve regurgitation is mild-moderate.  TTE 05/2018 Summary:  1. There is slightly decreased left ventricular systolic function. --with low normal LV wall motion globally 2. The ejection fraction is estimated to be 50%. 3. Abnormal diastolic filling pattern consistent with underlying atrial fibrillation was observed. 4. The left atrium is moderate to severely dilated.  5. The right atrium is mildly dilated. 6. There is moderate mitral regurgitation observed. --jet poorly visualized, 2 jets 7. There is mild dilatation of the ascending aorta. --at 3.57 cm 8. The inferior vena cava is not visualized. --possibly small in sizeSummary:  1. There is slightly decreased left ventricular systolic function. --with low normal LV wall motion globally 2. The ejection fraction is estimated to be 50%. 3. Abnormal diastolic filling pattern consistent with underlying atrial fibrillation was observed. 4. The left atrium is moderate to severely dilated.  5. The right atrium is mildly dilated. 6. There is moderate mitral regurgitation observed. --jet poorly  visualized, 2 jets 7. There is mild dilatation of the ascending aorta. --at 3.57 cm 8. The inferior vena cava is not visualized. --possibly small in size  Echo with bubble study 12/11/2016 Summary:  1. No pulmonary hypertension is noted. 2. The inferior vena cava appears normal suggesting normal central venous pressure. 3. There is mildly decreased left ventricular systolic function. 4. Mild global hypokinesis of the left ventricle is observed. 5. The EF is estimated at 40-45%. 6. The right atrium is mildly dilated. 7. The left atrium is mild to moderately dilated.  8. There is moderate mitral regurgitation observed. 9. There is no evidence of a patent foramen ovale by color Doppler and agitated saline contrast.  10. The left ventricle mass index and the relative wall thickness values indicate concentric hypertrophy.  Laboratory Data:  Chemistry Recent Labs  Lab 01/24/19 2150 01/25/19 0459  NA 143 143  K 3.6 2.7*  CL 105 104  CO2 29 29  GLUCOSE 163* 173*  BUN 26* 24*  CREATININE 1.07 0.92  CALCIUM 9.1 8.7*  GFRNONAA >60 >60  GFRAA >60 >60  ANIONGAP 9 10    No results for input(s): PROT, ALBUMIN, AST, ALT, ALKPHOS, BILITOT in the last 168 hours. Hematology Recent Labs  Lab 01/24/19 2150 01/25/19 0459  WBC 7.4 6.4  RBC 4.71 4.35  HGB 12.3* 11.1*  HCT 39.2 35.9*  MCV 83.2 82.5  MCH 26.1 25.5*  MCHC 31.4 30.9  RDW 15.9* 15.7*  PLT 90* 90*   Cardiac Enzymes Recent Labs  Lab 01/24/19 2150  TROPONINI 0.07*   No results for input(s): TROPIPOC in the last 168 hours.  BNP Recent Labs  Lab 01/24/19 2150  BNP 955.0*    DDimer No results for input(s): DDIMER in the last 168 hours.  Radiology/Studies:  Dg Chest Port 1 [address]  Result Date: 01/24/2019 CLINICAL DATA:  83 year old male with CHF. EXAM: PORTABLE CHEST 1 VIEW COMPARISON:  Chest radiograph dated 11/23/2018 FINDINGS: There is cardiomegaly with vascular congestion. A small left pleural effusion may be  present. No focal consolidation or pneumothorax. Atherosclerotic calcification of the aortic arch. No acute osseous pathology. IMPRESSION: Cardiomegaly with vascular congestion and possible small left pleural effusion. No significant interval change since the prior radiograph. Electronically Signed   By: Elgie Collard M.D.   On: 01/24/2019 23:04    Assessment and Plan:   Acute on chronic HFrEF, pulmonary HTN -Progressive shortness of breath, bilateral lower extremity edema, DOE reported per daughter-in-law. -Volume overloaded on exam. BNP elevated at 955.0.  -6/5 echo shows new and moderately reduced systolic function with EF 35 to 40%, moderately increased left ventricular wall thickness, global hypokinesis with some images suggestive of significant inferior wall hypokinesis, RVSP 62.98mmHg, severe LAE, moderate RAE, mild to moderate mitral regurgitation, mild to moderate tricuspid valve regurgitation. -Continue diuresis with IV lasix.  Monitor I's/O's.  Daily standing weights.  Titrate IV lasix as needed for optimal output and until euvolemic on exam. Of note, 12/21/2016 weight documented at 61.3 kg (135 pounds 0.5 ounces). - Will need oral lasix at discharge with potassium supplementation of KCl tab bid in addition to PTA Lasix 40 mg BID and at 8 AM and 2 PM.   -Daily BMET to monitor renal function and electrolytes. Significant hypokalemia with potassium 2.7.  Replete with goal 4.0. Cr 0.92, BUN 24. Recommend KCl x2 in addition to previous dose administered earlier today. -Continue PTA metoprolol tartrate 37.5 mg p.o. twice daily and lisinopril  daily. Given reduced EF as above, consider spironolactone given this is a potassium sparing diuretic.  Given his history of elevated blood pressure, consider also increasing lisinopril to lisinopril 20 mg daily for further BP control.  ICM, consider CAD -Daughter-in-law, previous chest pain leading up to admission -Patient not agreeable to  further ischemic work-up this admission.   -Recommend future ischemic work-up with cardiac catheterization given decreased EF and hypokinesis as above.   -For now, continue medical management as above with BB and ACE.  As above, consider increasing to lisinopril 20 mg daily and addition of spironolactone given hypokalemia and reduced EF.  Aggressive risk factor modification recommended, including statin therapy.  MR/TR - Recommend continue to monitor with periodic echo  Hypokalemia - K 2.7. Replete with goal 4.0 as above. - Mg 1.8 with goal 2.0.  - Repeat BMET following repletion to ensure at goal -That her spironolactone as above.  HTN -SBP into the high 180s - 190s - Nursing notes indicating daughter in law reported lightheaded for three days and HA -Consider increasing lisinopril to 20 mg daily for further BP support.   - Consider spironolactone.   - Continue lasix and BB.   HLD -Continue statin therapy with pravastatin 40 mg p.o. each night -11/2016 LDL 54 with total cholesterol 107.  Goal LDL below 70.  Permanent Afib with RVR - Remains on anticoagulation but documentation of reduced Eliquis dose with patient not meeting 2 of 3 criteria for reduce dose. Recommend continue full dose Eliquis  BID as does not meet the criteria for reduced dosage.  - CHA2DS2VASc score of at least 7 (CHF, HTN, agex2, strokex2, vascular). Recommend A1C lab as well for further risk stratification and as cannot find an A1C in records.  - TSH 05/2018 4.05.  - As above, neurology consulted and r/o acute CVA. Old  CVA noted in imaging as noted in history above. Family confirms at his baseline. -Continue rate control with metoprolol as above and titrate as needed for optimal rate control.   Anemia - Daily CCB - Hgb 11.1 with 05/2018 Hgb 13.8 - Recommend close monitoring of hemoglobin on anticoagulation. Consider workup for acute bleed if any sudden drops and recommend transfusion below 8.  Per IM.    For questions or updates, please contact [facility] Please consult [email or web address] for contact info under     Signed, Lennon Alstrom, PA-C  01/25/2019 8:40 AM

## 2019-01-25 NOTE — Plan of Care (Signed)
  Problem: Activity: Goal: Risk for activity intolerance will decrease Outcome: Progressing   Problem: Elimination: Goal: Will not experience complications related to urinary retention Outcome: Progressing   Problem: Pain Managment: Goal: General experience of comfort will improve Outcome: Progressing   Problem: Safety: Goal: Ability to remain free from injury will improve Outcome: Progressing   Problem: Skin Integrity: Goal: Risk for impaired skin integrity will decrease Outcome: Progressing   

## 2019-01-25 NOTE — Consult Note (Signed)
Referring Physician: Allena Katz    Chief Complaint: Facial droop and loss of speech  HPI: Brian Baker is an 83 y.o. male with a history of afib on Eliquis who was at baseline with medication administration this morning.  Was found later this morning unable to speak and with facial weakness.  Interpreter obtained and patient reports that he was just tired and did not want to say anything.  Son present at this time and repots that he is at baseline.  Initial NIHSS of 3.   Patient wit history of stroke.    Date last known well: Date: 01/25/2019 Time last known well: Time: 09:00 tPA Given: No: Patient on Eliquis, resolution of symptoms  Past Medical History:  Diagnosis Date  . CHF (congestive heart failure) (HCC)   . Enlarged prostate   . Hyperlipemia   . Hypertension   . Stroke [facility])     Surgical history: Artificial eye  History reviewed. No pertinent family history.   Social History:  reports that he has quit smoking. He has never used smokeless tobacco. He reports previous alcohol use. No history on file for drug.  Allergies: No Known Allergies  Medications:  I have reviewed the patient's current medications. Prior to Admission:  Medications Prior to Admission  Medication Sig Dispense Refill Last Dose  . albuterol (VENTOLIN HFA) 108 (90 Base) MCG/ACT inhaler Inhale 1-2 puffs into the lungs every 4 (four) hours as needed.    prn at prn  . ELIQUIS 5 MG TABS tablet Take 2.5 mg by mouth 2 (two) times daily.    01/24/2019 at 0800  . finasteride (PROSCAR) 5 MG tablet TK 1 T PO Q 24 H   01/24/2019 at Unknown time  . furosemide (LASIX) 40 MG tablet Take 40 mg by mouth daily.    01/24/2019 at Unknown time  . lisinopril (ZESTRIL) 10 MG tablet TK 1 T PO  D   01/24/2019 at Unknown time  . pravastatin (PRAVACHOL) 40 MG tablet TK 1 T PO HS   01/24/2019 at Unknown time  . tamsulosin (FLOMAX) 0.4 MG CAPS capsule TK ONE C PO QD   01/24/2019 at Unknown time  . metoprolol tartrate (LOPRESSOR) 25 MG tablet Take 25  mg by mouth 2 (two) times daily.    Not Taking at Unknown time  . omeprazole (PRILOSEC) 20 MG capsule Take 20 mg by mouth daily.    Not Taking at Unknown time   Scheduled: . apixaban  2.5 mg Oral BID  . aspirin EC  81 mg Oral Daily  . finasteride  5 mg Oral Daily  . furosemide  40 mg Intravenous Q12H  . lisinopril  10 mg Oral Daily  . metoprolol tartrate  25 mg Oral BID  . nitroGLYCERIN  0.5 inch Topical Q6H  . pantoprazole  40 mg Oral Daily  . pravastatin  40 mg Oral Daily  . sodium chloride flush  3 mL Intravenous Q12H  . tamsulosin  0.4 mg Oral Daily    ROS: History obtained from the patient  General ROS: negative for - chills, fatigue, fever, night sweats, weight gain or weight loss Psychological ROS: negative for - behavioral disorder, hallucinations, memory difficulties, mood swings or suicidal ideation Ophthalmic ROS: negative for - blurry vision, double vision, eye pain or loss of vision ENT ROS: negative for - epistaxis, nasal discharge, oral lesions, sore throat, tinnitus or vertigo Allergy and Immunology ROS: negative for - hives or itchy/watery eyes Hematological and Lymphatic ROS: negative for - bleeding  problems, bruising or swollen lymph nodes Endocrine ROS: negative for - galactorrhea, hair pattern changes, polydipsia/polyuria or temperature intolerance Respiratory ROS: negative for - cough, hemoptysis, shortness of breath or wheezing Cardiovascular ROS: negative for - chest pain, dyspnea on exertion, edema or irregular heartbeat Gastrointestinal ROS: negative for - abdominal pain, diarrhea, hematemesis, nausea/vomiting or stool incontinence Genito-Urinary ROS: negative for - dysuria, hematuria, incontinence or urinary frequency/urgency Musculoskeletal ROS: negative for - joint swelling or muscular weakness Neurological ROS: as noted in HPI Dermatological ROS: negative for rash and skin lesion changes  Physical Examination: Blood pressure (!) 146/92, pulse 73,  temperature 97.8 F (36.6 C), temperature source Oral, resp. rate 16, height 5\' 5"  (1.651 m), weight 67.4 kg, SpO2 94 %.  HEENT-  Normocephalic, no lesions, without obvious abnormality.  Normal external eye and conjunctiva.  Normal TM's bilaterally.  Normal auditory canals and external ears. Normal external nose, mucus membranes and septum.  Normal pharynx. Cardiovascular- S1, S2 normal, pulses palpable throughout   Lungs- chest clear, no wheezing, rales, normal symmetric air entry Abdomen- soft, non-tender; bowel sounds normal; no masses,  no organomegaly Extremities- no edema Lymph-no adenopathy palpable Musculoskeletal-no joint tenderness, deformity or swelling Skin-warm and dry, no hyperpigmentation, vitiligo, or suspicious lesions  Neurological Examination   Mental Status: Alert, oriented, thought content appropriate.  Speech fluent but slurred.  Has own dialect of Arabic.  Able to follow 3 step commands with some reinforcement Cranial Nerves: II: Does not blink with right confrontation, pupils unreactive III,IV, VI: right ptosis, extra-ocular motions intact in the left eye V,VII: right facial droop, facial light touch sensation normal bilaterally VIII: hearing normal bilaterally IX,X: gag reflex present XI: bilateral shoulder shrug XII: midline tongue extension Motor: Right : Upper extremity   5/5    Left:     Upper extremity   5/5  Lower extremity   5/5     Lower extremity   5/5 Tone and bulk:normal tone throughout; no atrophy noted Sensory: Pinprick and light touch intact throughout, bilaterally Deep Tendon Reflexes: Symmetric throughout Plantars: Right: mute   Left: mute Cerebellar: Normal finger-to-nose and normal heel-to-shin testing bilaterally Gait: not tested due to safety concerns  Laboratory Studies:  Basic Metabolic Panel: Recent Labs  Lab 01/24/19 2150 01/25/19 0459 01/25/19 0615  NA 143 143  --   K 3.6 2.7*  --   CL 105 104  --   CO2 29 29  --    GLUCOSE 163* 173*  --   BUN 26* 24*  --   CREATININE 1.07 0.92  --   CALCIUM 9.1 8.7*  --   MG  --   --  1.8    Liver Function Tests: No results for input(s): AST, ALT, ALKPHOS, BILITOT, PROT, ALBUMIN in the last 168 hours. No results for input(s): LIPASE, AMYLASE in the last 168 hours. No results for input(s): AMMONIA in the last 168 hours.  CBC: Recent Labs  Lab 01/24/19 2150 01/25/19 0459  WBC 7.4 6.4  NEUTROABS  --  4.6  HGB 12.3* 11.1*  HCT 39.2 35.9*  MCV 83.2 82.5  PLT 90* 90*    Cardiac Enzymes: Recent Labs  Lab 01/24/19 2150  TROPONINI 0.07*    BNP: Invalid input(s): POCBNP  CBG: Recent Labs  Lab 01/25/19 1200  GLUCAP 196*    Microbiology: Results for orders placed or performed during the hospital encounter of 01/24/19  SARS Coronavirus 2 (CEPHEID - [facility] Order  Status: None   Collection Time: 01/25/19 12:14 AM  Result Value Ref Range Status   SARS Coronavirus 2 NEGATIVE NEGATIVE Final    Comment: (NOTE) If result is NEGATIVE SARS-CoV-2 target nucleic acids are NOT DETECTED. The SARS-CoV-2 RNA is generally detectable in upper and lower  respiratory specimens during the acute phase of infection. The lowest  concentration of SARS-CoV-2 viral copies this assay can detect is 250  copies / mL. A negative result does not preclude SARS-CoV-2 infection  and should not be used as the sole basis for treatment or other  patient management decisions.  A negative result may occur with  improper specimen collection / handling, submission of specimen other  than nasopharyngeal swab, presence of viral mutation(s) within the  areas targeted by this assay, and inadequate number of viral copies  (<250 copies / mL). A negative result must be combined with clinical  observations, patient history, and epidemiological information. If result is POSITIVE SARS-CoV-2 target nucleic acids are DETECTED. The SARS-CoV-2 RNA is  generally detectable in upper and lower  respiratory specimens dur ing the acute phase of infection.  Positive  results are indicative of active infection with SARS-CoV-2.  Clinical  correlation with patient history and other diagnostic information is  necessary to determine patient infection status.  Positive results do  not rule out bacterial infection or co-infection with other viruses. If result is PRESUMPTIVE POSTIVE SARS-CoV-2 nucleic acids MAY BE PRESENT.   A presumptive positive result was obtained on the submitted specimen  and confirmed on repeat testing.  While 2019 novel coronavirus  (SARS-CoV-2) nucleic acids may be present in the submitted sample  additional confirmatory testing may be necessary for epidemiological  and / or clinical management purposes  to differentiate between  SARS-CoV-2 and other Sarbecovirus currently known to infect humans.  If clinically indicated additional testing with an alternate test  methodology [phone]) is advised. The SARS-CoV-2 RNA is generally  detectable in upper and lower respiratory sp ecimens during the acute  phase of infection. The expected result is Negative. Fact Sheet for Patients:  BoilerBrush.com.cy Fact Sheet for Healthcare Providers: https://pope.com/ This test is not yet approved or cleared by the [place] FDA and has been authorized for detection and/or diagnosis of SARS-CoV-2 by FDA under an Emergency Use Authorization (EUA).  This EUA will remain in effect (meaning this test can be used) for the duration of the COVID-19 declaration under Section 564(b)(1) of the Act, 21 U.S.C. section 360bbb-3(b)(1), unless the authorization is terminated or revoked sooner. Performed at [facility], [address] [place], [place] [address]     Coagulation Studies: No results for input(s): LABPROT, INR in the last 72 hours.  Urinalysis: No results for input(s):  COLORURINE, LABSPEC, PHURINE, GLUCOSEU, HGBUR, BILIRUBINUR, KETONESUR, PROTEINUR, UROBILINOGEN, NITRITE, LEUKOCYTESUR in the last 168 hours.  Invalid input(s): APPERANCEUR  Lipid Panel: No results found for: CHOL, TRIG, HDL, CHOLHDL, VLDL, LDLCALC  HgbA1C: No results found for: HGBA1C  Urine Drug Screen:  No results found for: LABOPIA, COCAINSCRNUR, LABBENZ, AMPHETMU, THCU, LABBARB  Alcohol Level: No results for input(s): ETH in the last 168 hours.  Other results: EKG: atrial fibrillation, rate 95 bpm.  Imaging: Dg Chest Port 1 View  Result Date: 01/24/2019 CLINICAL DATA:  83 year old male with CHF. EXAM: PORTABLE CHEST 1 VIEW COMPARISON:  Chest radiograph dated 11/23/2018 FINDINGS: There is cardiomegaly with vascular congestion. A small left pleural effusion may be present. No focal consolidation or pneumothorax. Atherosclerotic calcification of the  aortic arch. No acute osseous pathology. IMPRESSION: Cardiomegaly with vascular congestion and possible small left pleural effusion. No significant interval change since the prior radiograph. Electronically Signed   By: Elgie Collard M.D.   On: 01/24/2019 23:04   Ct Head Code Stroke Wo Contrast`  Result Date: 01/25/2019 CLINICAL DATA:  Code stroke. 83 year old male with ataxia, loss of speech, right facial droop noticed at 0900 hours. EXAM: CT HEAD WITHOUT CONTRAST TECHNIQUE: Contiguous axial images were obtained from the base of the skull through the vertex without intravenous contrast. COMPARISON:  None. FINDINGS: Brain: Confluent hypodensity in the anterior left MCA territory, extending back toward the motor strip superiorly (series 2, image 24). But this has a fairly chronic appearance and there is some ex vacuo enlargement of the left lateral ventricle. Superimposed bilateral deep gray matter nuclei heterogeneity. No acute intracranial hemorrhage identified. No midline shift, mass effect, or evidence of intracranial mass lesion. No  ventriculomegaly. No other cortically based infarct changes. Vascular: Calcified atherosclerosis at the skull base. No suspicious intracranial vascular hyperdensity. Skull: Negative. Sinuses/Orbits: Left maxillary mucoperiosteal thickening with mild bubbly opacity. Remaining sinuses, tympanic cavities and mastoids are well pneumatized. Other: Surgically absent right globe with prosthesis. No acute orbit or scalp soft tissue finding. ASPECTS [facility] Stroke Program Early CT Score) Total score (0-10 with 10 being normal): Probably 10; largely chronic appearing encephalomalacia in the left MCA territory. IMPRESSION: 1. Probably chronic left MCA territory infarct. No acute intracranial hemorrhage or mass effect. 2. ASPECTS probably 10; largely chronic appearing encephalomalacia in the left MCA territory. Electronically Signed   By: Odessa Fleming M.D.   On: 01/25/2019 12:04    Assessment: 83 y.o. male with a history of afib on Eliquis who today was not responding verbally.  Was noted to have worsening of facial droop as well.  Code stroke initiated.  Head CT reviewed and shows chronic infarcts but no acute changes.  No evidence of hemorrhage.  CTA shows no evidence of large vessel occlusion.  Patient not a tPA candidate due to having received Eliquis in the past 48 hours and with improved exam patient not an intervention candidate.  Patient seen by family and felt to be at baseline at this time.  Initial NIHSS of 3.   Echocardiogram shows no cardiac source of emboli with an EF of 35-40%.    Stroke Risk Factors - hyperlipidemia and hypertension  Plan: 1. HgbA1c, fasting lipid panel 2. PT consult, OT consult, Speech consult 3. Prophylactic therapy-Continue Eliquis 4. NPO until RN stroke swallow screen 5. Telemetry monitoring 6. Frequent neuro checks    Thana Farr, MD Neurology [phone] 01/25/2019, 12:09 PM

## 2019-01-25 NOTE — H&P (Addendum)
[facility] [place] [facility]   PATIENT NAME: Brian Baker    MR#:  [id number]  DATE OF BIRTH:  01-30-1936  DATE OF ADMISSION:  01/24/2019  PRIMARY CARE PHYSICIAN: Huguley Nation, MD    REQUESTING/REFERRING PHYSICIAN: Darci Current, MD  CHIEF COMPLAINT:   Chief Complaint  Patient presents with  . Hypertension    HISTORY OF PRESENT ILLNESS:  Brian Baker  is a 83 y.o. [place] male with a known history of chronic systolic and diastolic CHF, who presented to the emergency room with acute onset of elevated blood pressure and worsening dyspnea since this evening with associated orthopnea and paroxysmal nocturnal dyspnea.  No chest pain or palpitations.  No fever or chills.  No dysuria, oliguria or hematuria or flank pain.  He has been having bilateral ankle and pedal edema.  No fever or chills.  No recent sick exposure.  Upon presentation to the emergency room, his blood pressure was 189/139 with a heart rate of 100 and pulse ox of 87% on room air, respiratory to 19 and temperature 98.  Labs were remarkable for borderline potassium of 3.6 and a BNP of 955.  His COVID-19 test came back negative.  EKG showed atrial fibrillation with controlled ventricular spots of 95 with PVCs and inferolateral T wave inversion.  Portable chest x-ray revealed cardiomegaly with vascular congestion and possible small left pleural effusion.  He was given half an inch of Nitropaste well his 40 mg of IV Lasix and 4 baby aspirin.  He will be admitted to a telemetry bed for further evaluation and management.   PAST MEDICAL HISTORY:   Past Medical History:  Diagnosis Date  . CHF (congestive heart failure) (HCC)   . Enlarged prostate   . Hyperlipemia   . Hypertension   . 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 Smoking status: Former Games developer  . Smokeless tobacco: Never Used  Substance Use Topics  . Alcohol use: Not Currently    FAMILY HISTORY:  No remarkable familial diseases  DRUG ALLERGIES:  No Known Allergies  REVIEW OF SYSTEMS:   ROS As per history of present illness. All pertinent systems were reviewed above. Constitutional,  HEENT, cardiovascular, respiratory, GI, GU, musculoskeletal, neuro, psychiatric, endocrine,  integumentary and hematologic systems were reviewed and are otherwise  negative/unremarkable except for positive findings mentioned above in the HPI.   MEDICATIONS AT HOME:   Prior to Admission medications   Medication Sig Start Date End Date Taking? Authorizing Provider  albuterol (VENTOLIN HFA) 108 (90 Base) MCG/ACT inhaler Inhale 1-2 puffs into the lungs every 4 (four) hours as needed.  05/27/18 05/27/19 Yes [provider]  ELIQUIS 5 MG TABS tablet Take 2.5 mg by mouth 2 (two) times daily.  01/09/19  Yes [provider]  finasteride (PROSCAR) 5 MG tablet TK 1 T PO Q 24 H 12/02/18  Yes [provider]  furosemide (LASIX) 40 MG tablet Take 40 mg by mouth daily.  11/26/18  Yes [provider]  lisinopril (ZESTRIL) 10 MG tablet TK 1 T PO  D 12/27/18  Yes [provider]  pravastatin (PRAVACHOL) 40 MG tablet TK 1 T PO HS 01/04/19  Yes [provider]  tamsulosin (FLOMAX) 0.4 MG CAPS  capsule TK ONE C PO QD 10/29/18  Yes [provider]  metoprolol tartrate (LOPRESSOR) 25 MG tablet Take 25 mg by mouth 2 (two) times daily.  12/25/18   [provider]  omeprazole (PRILOSEC) 20 MG capsule Take 20 mg by mouth daily.  12/25/18   [provider]      VITAL SIGNS:  Blood pressure (!) 169/127, pulse 87, temperature 98 F (36.7 C), resp. rate (!) 22, height  (1.651 m), weight 67.1 kg, SpO2 97 %.  PHYSICAL EXAMINATION:   Physical Exam  GENERAL:  83 y.o.-year-old [place] male patient lying in the bed in mild respiratory distress with conversational dyspnea. EYES: Pupils equal, round, reactive to light and accommodation. No scleral icterus. Extraocular muscles intact.  HEENT: Head atraumatic, normocephalic. Oropharynx and nasopharynx clear.  NECK:  Supple, no jugular venous distention. No thyroid enlargement, no tenderness.  LUNGS: Diminished bibasilar breath sounds with bibasilar rales. CARDIOVASCULAR: Regular rate and rhythm, S1, S2 normal. No murmurs, rubs, or gallops.  ABDOMEN: Soft, nondistended, nontender. Bowel sounds present. No organomegaly or mass.  EXTREMITIES: No pedal edema, cyanosis, or clubbing.  NEUROLOGIC: Cranial nerves II through XII are intact. Muscle strength 5/5 in all extremities. Sensation intact. Gait not checked.  PSYCHIATRIC: The patient is alert and oriented x 3.  Normal affect and good eye contact. SKIN: No obvious rash, lesion, or ulcer.   LABORATORY PANEL:   CBC Recent Labs  Lab 01/24/19 2150  WBC 7.4  HGB 12.3*  HCT 39.2  PLT 90*   ------------------------------------------------------------------------------------------------------------------  Chemistries  Recent Labs  Lab 01/24/19 2150  NA 143  K 3.6  CL 105  CO2 29  GLUCOSE 163*  BUN 26*  CREATININE 1.07  CALCIUM 9.1   ------------------------------------------------------------------------------------------------------------------  Cardiac Enzymes Recent Labs  Lab 01/24/19 2150  TROPONINI 0.07*   ------------------------------------------------------------------------------------------------------------------  RADIOLOGY:  Dg Chest Port 1 View  Result Date: 01/24/2019 CLINICAL DATA:  83 year old male with CHF. EXAM: PORTABLE CHEST 1 VIEW COMPARISON:  Chest radiograph dated 11/23/2018 FINDINGS: There is cardiomegaly with vascular congestion. A small left pleural effusion may be present. No focal  consolidation or pneumothorax. Atherosclerotic calcification of the aortic arch. No acute osseous pathology. IMPRESSION: Cardiomegaly with vascular congestion and possible small left pleural effusion. No significant interval change since the prior radiograph. Electronically Signed   By: Elgie Collard M.D.   On: 01/24/2019 23:04      IMPRESSION AND PLAN:   1.  Acute on chronic systolic and diastolic CHF.  The patient will be admitted to a telemetry bed and will be diuresed with IV Lasix.  Will follow serial cardiac enzymes.  Will obtain a 2D echo(if not done last 6 months) and a cardiology consultation in a.m. by Dr. Elease Hashimoto who was notified about the consult.  2.  Hypertensive urgency.  This is likely the culprit for #1.  The patient will be placed on PRN IV labetalol and hydralazine.  We will continue lisinopril and Lopressor.  3.  Chronic atrial fibrillation with controlled ventricular response.  We will continue Lopressor and Eliquis.  4.  GERD.  PPI therapy will be resumed.  5.  Dyslipidemia.  His statin therapy will be resumed.  6.  BPH.  His Flomax and Proscar will be continued.  7.  DVT prophylaxis.  He will be continued on Eliquis.    All the records are reviewed and case discussed with ED provider. The plan of care was discussed in details with the patient (and family). I  answered all questions. The patient agreed to proceed with the above mentioned plan. Further management will depend upon hospital course.   CODE STATUS: Full code  TOTAL TIME TAKING CARE OF THIS PATIENT: 45 minutes.    Hannah BeatJan A Mansy M.D on 01/25/2019 at 12:25 AM  Pager - [phone]  After 6pm go to [email or web address] - Social research officer, governmentpassword [email or web address]  [facility] [place] [facility]  Office  [phone]  CC: Primary care physician; Patient, No Pcp Per   Note: This dictation was prepared with Dragon dictation along with smaller phrase technology. Any transcriptional errors that result from this process  are unintentional.

## 2019-01-25 NOTE — Progress Notes (Addendum)
1105--found pt non-verbal with facial drooping.  Grips strong and equal.  No arm drift noted.  Full movement and strength of bilateral lower extremities. RRT called for possible CVA. 1110--Code Stroke initiated. 1120--pt to CT with RRT

## 2019-01-25 NOTE — Progress Notes (Signed)
Attempted to ask patient assessment questions using interpretor, Roseanne Reno [phone], however patient not receptive at this time. Per interpretor, the patient told him to go away, unwilling to answer questions at this time.   Update: This RN contacted son to update him. Few questions answered; however son not receptive to answer questions at this time either. Will attempt at a later time.   Mayra Neer M

## 2019-01-25 NOTE — Progress Notes (Signed)
[facility] [place] [facility]                                                                                                                                                                                  Patient Demographics   Brian Baker, is a 83 y.o. male, DOB - September 28, 1935, ZOX:[id number]  Admit date - 01/24/2019   Admitting Physician Hannah Beat, MD  Outpatient Primary MD for the patient is [facility]   LOS - 1  Subjective: Patient admitted with CHF this morning he is feeling better Denies any chest pain or palpitation   Review of Systems:   CONSTITUTIONAL: No documented fever. No fatigue, weakness. No weight gain, no weight loss.  EYES: No blurry or double vision.  ENT: No tinnitus. No postnasal drip. No redness of the oropharynx.  RESPIRATORY: No cough, no wheeze, no hemoptysis. No dyspnea.  CARDIOVASCULAR: No chest pain. No orthopnea. No palpitations. No syncope.  GASTROINTESTINAL: No nausea, no vomiting or diarrhea. No abdominal pain. No melena or hematochezia.  GENITOURINARY: No dysuria or hematuria.  ENDOCRINE: No polyuria or nocturia. No heat or cold intolerance.  HEMATOLOGY: No anemia. No bruising. No bleeding.  INTEGUMENTARY: No rashes. No lesions.  MUSCULOSKELETAL: No arthritis. No swelling. No gout.  NEUROLOGIC: No numbness, tingling, or ataxia. No seizure-type activity.  PSYCHIATRIC: No anxiety. No insomnia. No ADD.    Vitals:   Vitals:   01/25/19 0807 01/25/19 1115 01/25/19 1415 01/25/19 1430  BP: (!) 147/98 (!) 146/92 (!) 159/102 (!) 148/129  Pulse: 77 73 80 78  Resp: 16  (!) 22 (!) 22  Temp: (!) 97.5 F (36.4 C) 97.8 F (36.6 C)  97.9 F (36.6 C)  TempSrc: Oral Oral  Axillary  SpO2: 93% 94% 96% 96%  Weight:      Height:        Wt Readings from Last 3 Encounters:  01/25/19 67.4 kg  11/23/18 66.5 kg     Intake/Output Summary (Last 24 hours) at 01/25/2019 1558 Last data filed at 01/25/2019 1516 Gross per 24 hour   Intake -  Output 1030 ml  Net -1030 ml    Physical Exam:   GENERAL: Pleasant-appearing in no apparent distress.  HEAD, EYES, EARS, NOSE AND THROAT: Atraumatic, normocephalic. Extraocular muscles are intact. Pupils equal and reactive to light. Sclerae anicteric. No conjunctival injection. No oro-pharyngeal erythema.  NECK: Supple. There is no jugular venous distention. No bruits, no lymphadenopathy, no thyromegaly.  HEART: Regular rate and rhythm,. No murmurs, no rubs, no clicks.  LUNGS: Clear to auscultation bilaterally. No rales or rhonchi. No wheezes.  ABDOMEN: Soft, flat, nontender, nondistended. Has good bowel  sounds. No hepatosplenomegaly appreciated.  EXTREMITIES: No evidence of any cyanosis, clubbing, or peripheral edema.  +2 pedal and radial pulses bilaterally.  NEUROLOGIC: The patient is alert, awake, and oriented x3 with no focal motor or sensory deficits appreciated bilaterally.  SKIN: Moist and warm with no rashes appreciated.  Psych: Not anxious, depressed LN: No inguinal LN enlargement    Antibiotics   Anti-infectives (From admission, onward)   None      Medications   Scheduled Meds: . apixaban  2.5 mg Oral BID  . aspirin EC  81 mg Oral Daily  . finasteride  5 mg Oral Daily  . furosemide  40 mg Intravenous Q12H  . lisinopril  10 mg Oral Daily  . metoprolol tartrate  25 mg Oral BID  . metoprolol tartrate  25 mg Oral Once  . nitroGLYCERIN  0.5 inch Topical Q6H  . pantoprazole  40 mg Oral Daily  . pravastatin  40 mg Oral Daily  . sodium chloride flush  3 mL Intravenous Q12H  . tamsulosin  0.4 mg Oral Daily   Continuous Infusions: . sodium chloride     PRN Meds:.sodium chloride, acetaminophen, albuterol, hydrALAZINE, labetalol, ondansetron (ZOFRAN) IV, sodium chloride flush, zolpidem   Data Review:   Micro Results Recent Results (from the past 240 hour(s))  SARS Coronavirus 2 (CEPHEID - Performed in [facility] hospital lab), Hosp Order     Status:  None   Collection Time: 01/25/19 12:14 AM  Result Value Ref Range Status   SARS Coronavirus 2 NEGATIVE NEGATIVE Final    Comment: (NOTE) If result is NEGATIVE SARS-CoV-2 target nucleic acids are NOT DETECTED. The SARS-CoV-2 RNA is generally detectable in upper and lower  respiratory specimens during the acute phase of infection. The lowest  concentration of SARS-CoV-2 viral copies this assay can detect is 250  copies / mL. A negative result does not preclude SARS-CoV-2 infection  and should not be used as the sole basis for treatment or other  patient management decisions.  A negative result may occur with  improper specimen collection / handling, submission of specimen other  than nasopharyngeal swab, presence of viral mutation(s) within the  areas targeted by this assay, and inadequate number of viral copies  (<250 copies / mL). A negative result must be combined with clinical  observations, patient history, and epidemiological information. If result is POSITIVE SARS-CoV-2 target nucleic acids are DETECTED. The SARS-CoV-2 RNA is generally detectable in upper and lower  respiratory specimens dur ing the acute phase of infection.  Positive  results are indicative of active infection with SARS-CoV-2.  Clinical  correlation with patient history and other diagnostic information is  necessary to determine patient infection status.  Positive results do  not rule out bacterial infection or co-infection with other viruses. If result is PRESUMPTIVE POSTIVE SARS-CoV-2 nucleic acids MAY BE PRESENT.   A presumptive positive result was obtained on the submitted specimen  and confirmed on repeat testing.  While 2019 novel coronavirus  (SARS-CoV-2) nucleic acids may be present in the submitted sample  additional confirmatory testing may be necessary for epidemiological  and / or clinical management purposes  to differentiate between  SARS-CoV-2 and other Sarbecovirus currently known to infect  humans.  If clinically indicated additional testing with an alternate test  methodology [phone]) is advised. The SARS-CoV-2 RNA is generally  detectable in upper and lower respiratory sp ecimens during the acute  phase of infection. The expected result is Negative. Fact Sheet for Patients:  BoilerBrush.com.cy Fact Sheet for Healthcare Providers: https://pope.com/ This test is not yet approved or cleared by the [place] FDA and has been authorized for detection and/or diagnosis of SARS-CoV-2 by FDA under an Emergency Use Authorization (EUA).  This EUA will remain in effect (meaning this test can be used) for the duration of the COVID-19 declaration under Section 564(b)(1) of the Act, 21 U.S.C. section 360bbb-3(b)(1), unless the authorization is terminated or revoked sooner. Performed at [facility], [address] [place], [place] [address]     Radiology Reports Ct Code Stroke Cta Head W/wo Contrast  Result Date: 01/25/2019 CLINICAL DATA:  83 year old male with ataxia, loss of speech, right facial droop noticed at 0900 hours. EXAM: CT ANGIOGRAPHY HEAD AND NECK TECHNIQUE: Multidetector CT imaging of the head and neck was performed using the standard protocol during bolus administration of intravenous contrast. Multiplanar CT image reconstructions and MIPs were obtained to evaluate the vascular anatomy. Carotid stenosis measurements (when applicable) are obtained utilizing NASCET criteria, using the distal internal carotid diameter as the denominator. CONTRAST:  73mL OMNIPAQUE IOHEXOL 350 MG/ML SOLN COMPARISON:  Head CT 1128 hours today. FINDINGS: CTA NECK Skeleton: Absent dentition. Flowing bulky endplate osteophytes in the cervical spine. No acute osseous abnormality identified. Upper chest: Small layering left pleural effusion. Centrilobular emphysema. Maximal anterior carina lymph nodes up to 11 millimeters. Other neck:  Partially retropharyngeal course of the carotid arteries, especially the right (normal variant). Negative for neck mass or lymphadenopathy. Aortic arch: Calcified aortic atherosclerosis. 3 vessel arch configuration. Right carotid system: No brachiocephalic artery or right CCA origin stenosis. Soft and calcified plaque in the right CCA proximal to the bifurcation without stenosis. Generally mild plaque at the bifurcation and left ICA origin, mostly calcified. No proximal right ICA stenosis. Tortuous vessel distal to the bulb extending in the retropharynx to the midline with a kinked appearance (series 9, image 82), but otherwise no stenosis to the skull base. Left carotid system: Left CCA calcified plaque without stenosis. Tortuous proximal left CCA. Soft and calcified plaque proximal to the bifurcation without stenosis. Mild calcified plaque at the left ICA origin without stenosis. Tortuous vessel distal to the bulb with a kinked appearance in the neck, and then a partially retropharyngeal course. But no stenosis to the skull base. Vertebral arteries: No proximal right subclavian artery stenosis despite plaque. Normal right vertebral artery origin. Patent right vertebral artery to the skull base without stenosis. No proximal left subclavian artery stenosis despite soft and calcified plaque. Calcified plaque near the left vertebral artery origin but no origin stenosis identified. Tortuous left V1 segment with a kinked appearance at the thoracic inlet. The left vertebral artery is dominant and patent to the skull base without stenosis. CTA HEAD Posterior circulation: Dominant left vertebral artery. No distal vertebral stenosis. Patent PICA origins and vertebrobasilar junction. Tortuous basilar artery without stenosis. Patent AICA, SCA and PCA origins. Posterior communicating arteries are diminutive or absent. Bilateral PCA branches are within normal limits. Anterior circulation: Both ICA siphons are patent. On the  left there is moderate calcified plaque with only mild stenosis. Normal left ophthalmic artery origin. On the right there is similar moderate calcified plaque with only mild stenosis. Normal right ophthalmic artery origin. Patent carotid termini. Normal MCA and ACA origins. Ectatic ACAs. Normal anterior communicating artery. Bilateral ACA branches are within normal limits. Left MCA M1 segment and bifurcation are widely patent. No left MCA branch occlusion is identified, the anterior division is attenuated. Right MCA M1 and  bifurcation are widely patent. Right MCA branches are within normal limits. Venous sinuses: Patent. Anatomic variants: Dominant left vertebral artery. Review of the MIP images confirms the above findings IMPRESSION: 1. Negative for emergent large vessel occlusion. 2. Arterial tortuosity in the head and neck with atherosclerosis but no hemodynamically significant stenosis identified. 3. Small layering left pleural effusion. Emphysema (ICD10-J43.9). Study discussed by telephone with Dr. Thana Farr on 01/25/2019 at 12:08 . Electronically Signed   By: Odessa Fleming M.D.   On: 01/25/2019 12:16   Ct Code Stroke Cta Neck W/wo Contrast  Result Date: 01/25/2019 CLINICAL DATA:  83 year old male with ataxia, loss of speech, right facial droop noticed at 0900 hours. EXAM: CT ANGIOGRAPHY HEAD AND NECK TECHNIQUE: Multidetector CT imaging of the head and neck was performed using the standard protocol during bolus administration of intravenous contrast. Multiplanar CT image reconstructions and MIPs were obtained to evaluate the vascular anatomy. Carotid stenosis measurements (when applicable) are obtained utilizing NASCET criteria, using the distal internal carotid diameter as the denominator. CONTRAST:  75mL OMNIPAQUE IOHEXOL 350 MG/ML SOLN COMPARISON:  Head CT 1128 hours today. FINDINGS: CTA NECK Skeleton: Absent dentition. Flowing bulky endplate osteophytes in the cervical spine. No acute osseous abnormality  identified. Upper chest: Small layering left pleural effusion. Centrilobular emphysema. Maximal anterior carina lymph nodes up to 11 millimeters. Other neck: Partially retropharyngeal course of the carotid arteries, especially the right (normal variant). Negative for neck mass or lymphadenopathy. Aortic arch: Calcified aortic atherosclerosis. 3 vessel arch configuration. Right carotid system: No brachiocephalic artery or right CCA origin stenosis. Soft and calcified plaque in the right CCA proximal to the bifurcation without stenosis. Generally mild plaque at the bifurcation and left ICA origin, mostly calcified. No proximal right ICA stenosis. Tortuous vessel distal to the bulb extending in the retropharynx to the midline with a kinked appearance (series 9, image 82), but otherwise no stenosis to the skull base. Left carotid system: Left CCA calcified plaque without stenosis. Tortuous proximal left CCA. Soft and calcified plaque proximal to the bifurcation without stenosis. Mild calcified plaque at the left ICA origin without stenosis. Tortuous vessel distal to the bulb with a kinked appearance in the neck, and then a partially retropharyngeal course. But no stenosis to the skull base. Vertebral arteries: No proximal right subclavian artery stenosis despite plaque. Normal right vertebral artery origin. Patent right vertebral artery to the skull base without stenosis. No proximal left subclavian artery stenosis despite soft and calcified plaque. Calcified plaque near the left vertebral artery origin but no origin stenosis identified. Tortuous left V1 segment with a kinked appearance at the thoracic inlet. The left vertebral artery is dominant and patent to the skull base without stenosis. CTA HEAD Posterior circulation: Dominant left vertebral artery. No distal vertebral stenosis. Patent PICA origins and vertebrobasilar junction. Tortuous basilar artery without stenosis. Patent AICA, SCA and PCA origins. Posterior  communicating arteries are diminutive or absent. Bilateral PCA branches are within normal limits. Anterior circulation: Both ICA siphons are patent. On the left there is moderate calcified plaque with only mild stenosis. Normal left ophthalmic artery origin. On the right there is similar moderate calcified plaque with only mild stenosis. Normal right ophthalmic artery origin. Patent carotid termini. Normal MCA and ACA origins. Ectatic ACAs. Normal anterior communicating artery. Bilateral ACA branches are within normal limits. Left MCA M1 segment and bifurcation are widely patent. No left MCA branch occlusion is identified, the anterior division is attenuated. Right MCA M1 and bifurcation are widely patent.  Right MCA branches are within normal limits. Venous sinuses: Patent. Anatomic variants: Dominant left vertebral artery. Review of the MIP images confirms the above findings IMPRESSION: 1. Negative for emergent large vessel occlusion. 2. Arterial tortuosity in the head and neck with atherosclerosis but no hemodynamically significant stenosis identified. 3. Small layering left pleural effusion. Emphysema (ICD10-J43.9). Study discussed by telephone with Dr. Thana Farr on 01/25/2019 at 12:08 . Electronically Signed   By: Odessa Fleming M.D.   On: 01/25/2019 12:16   Dg Chest Port 1 View  Result Date: 01/24/2019 CLINICAL DATA:  83 year old male with CHF. EXAM: PORTABLE CHEST 1 VIEW COMPARISON:  Chest radiograph dated 11/23/2018 FINDINGS: There is cardiomegaly with vascular congestion. A small left pleural effusion may be present. No focal consolidation or pneumothorax. Atherosclerotic calcification of the aortic arch. No acute osseous pathology. IMPRESSION: Cardiomegaly with vascular congestion and possible small left pleural effusion. No significant interval change since the prior radiograph. Electronically Signed   By: Elgie Collard M.D.   On: 01/24/2019 23:04   Ct Head Code Stroke Wo Contrast`  Addendum Date:  01/25/2019   ADDENDUM REPORT: 01/25/2019 12:16 ADDENDUM: Study discussed by telephone with Dr. Thana Farr on 01/25/2019 at 12:08 . Electronically Signed   By: Odessa Fleming M.D.   On: 01/25/2019 12:16   Result Date: 01/25/2019 CLINICAL DATA:  Code stroke. 83 year old male with ataxia, loss of speech, right facial droop noticed at 0900 hours. EXAM: CT HEAD WITHOUT CONTRAST TECHNIQUE: Contiguous axial images were obtained from the base of the skull through the vertex without intravenous contrast. COMPARISON:  None. FINDINGS: Brain: Confluent hypodensity in the anterior left MCA territory, extending back toward the motor strip superiorly (series 2, image 24). But this has a fairly chronic appearance and there is some ex vacuo enlargement of the left lateral ventricle. Superimposed bilateral deep gray matter nuclei heterogeneity. No acute intracranial hemorrhage identified. No midline shift, mass effect, or evidence of intracranial mass lesion. No ventriculomegaly. No other cortically based infarct changes. Vascular: Calcified atherosclerosis at the skull base. No suspicious intracranial vascular hyperdensity. Skull: Negative. Sinuses/Orbits: Left maxillary mucoperiosteal thickening with mild bubbly opacity. Remaining sinuses, tympanic cavities and mastoids are well pneumatized. Other: Surgically absent right globe with prosthesis. No acute orbit or scalp soft tissue finding. ASPECTS [facility] Stroke Program Early CT Score) Total score (0-10 with 10 being normal): Probably 10; largely chronic appearing encephalomalacia in the left MCA territory. IMPRESSION: 1. Probably chronic left MCA territory infarct. No acute intracranial hemorrhage or mass effect. 2. ASPECTS probably 10; largely chronic appearing encephalomalacia in the left MCA territory. Electronically Signed: By: Odessa Fleming M.D. On: 01/25/2019 12:04     CBC Recent Labs  Lab 01/24/19 2150 01/25/19 0459  WBC 7.4 6.4  HGB 12.3* 11.1*  HCT 39.2 35.9*  PLT 90*  90*  MCV 83.2 82.5  MCH 26.1 25.5*  MCHC 31.4 30.9  RDW 15.9* 15.7*  LYMPHSABS  --  1.1  MONOABS  --  0.6  EOSABS  --  0.1  BASOSABS  --  0.0    Chemistries  Recent Labs  Lab 01/24/19 2150 01/25/19 0459 01/25/19 0615  NA 143 143  --   K 3.6 2.7*  --   CL 105 104  --   CO2 29 29  --   GLUCOSE 163* 173*  --   BUN 26* 24*  --   CREATININE 1.07 0.92  --   CALCIUM 9.1 8.7*  --   MG  --   --  1.8   ------------------------------------------------------------------------------------------------------------------ estimated creatinine clearance is 54.8 mL/min (by C-G formula based on SCr of 0.92 mg/dL). ------------------------------------------------------------------------------------------------------------------ No results for input(s): HGBA1C in the last 72 hours. ------------------------------------------------------------------------------------------------------------------ No results for input(s): CHOL, HDL, LDLCALC, TRIG, CHOLHDL, LDLDIRECT in the last 72 hours. ------------------------------------------------------------------------------------------------------------------ No results for input(s): TSH, T4TOTAL, T3FREE, THYROIDAB in the last 72 hours.  Invalid input(s): FREET3 ------------------------------------------------------------------------------------------------------------------ No results for input(s): VITAMINB12, FOLATE, FERRITIN, TIBC, IRON, RETICCTPCT in the last 72 hours.  Coagulation profile No results for input(s): INR, PROTIME in the last 168 hours.  No results for input(s): DDIMER in the last 72 hours.  Cardiac Enzymes Recent Labs  Lab 01/24/19 2150  TROPONINI 0.07*   ------------------------------------------------------------------------------------------------------------------ Invalid input(s): POCBNP    Assessment & Plan   1.  Acute on chronic systolic and diastolic CHF.  Continue diuresis with IV Lasix Patient seems to be  responding well  2.  Hypertensive urgencycontinue lisinopril and Lopressor  3.  Chronic atrial fibrillation with controlled ventricular response.  We will continue Lopressor and Eliquis.  4.  GERD.  PPI therapy will be resumed.  5.  Dyslipidemia.  His statin therapy will be resumed.  6.  BPH.  His Flomax and Proscar will be continued.  7.  DVT prophylaxis.  He will be continued on Eliquis.     Code Status Orders  (From admission, onward)         Start     Ordered   01/24/19 2353  Full code  Continuous     01/24/19 2354        Code Status History    This patient has a current code status but no historical code status.    Advance Directive Documentation     Most Recent Value  Type of Advance Directive  Healthcare Power of Attorney  Pre-existing out of facility DNR order (yellow form or pink MOST form)  -  "MOST" Form in Place?  -           Consults  none  DVT Prophylaxis  eliquis  Lab Results  Component Value Date   PLT 90 (L) 01/25/2019     Time Spent in minutes  35min Greater than 50% of time spent in care coordination and counseling patient regarding the condition and plan of care.   Auburn BilberryShreyang Shalena Ezzell M.D on 01/25/2019 at 3:58 PM  Between 7am to 6pm - Pager - [phone]  After 6pm go to [email or web address] - Social research officer, governmentpassword [email or web address]  [employer]   Office  [phone]

## 2019-01-25 NOTE — Plan of Care (Signed)
  Problem: Activity: Goal: Risk for activity intolerance will decrease Outcome: Progressing   Problem: Elimination: Goal: Will not experience complications related to urinary retention Outcome: Progressing   Problem: Pain Managment: Goal: General experience of comfort will improve Outcome: Progressing   Problem: Safety: Goal: Ability to remain free from injury will improve Outcome: Progressing   Problem: Skin Integrity: Goal: Risk for impaired skin integrity will decrease Outcome: Progressing   Problem: Activity: Goal: Capacity to carry out activities will improve Outcome: Progressing   Problem: Cardiac: Goal: Ability to achieve and maintain adequate cardiopulmonary perfusion will improve Outcome: Progressing

## 2019-01-25 NOTE — Progress Notes (Signed)
Potassium level 2.7 this AM. MD Mansy made aware. Per MD if patient will take PO medication, give 40 meq PO potassium then repeat in 2 hours. If patient is unwilling to take PO medication, then give 40 IV potassium. Recheck potassium, if less than 4 than proceed with another round of 40 IV potassium. MD would also like to check magnesium level. Per MD if magnesium is less than 2 than he would like to give 2 grams of magnesium. Will place appropriate orders and administer as prescribed.   Mayra Neer M

## 2019-01-25 NOTE — Discharge Summary (Signed)
Physician Discharge Summary  Patient ID: Brian Baker MRN: [id number] DOB/AGE: 1936/07/09 83 y.o.  Admit date: 01/24/2019 Discharge date: 01/25/2019  Admission Diagnoses:SOB, CHF exacerbation  Discharge Diagnoses:  Active Problems:   Acute CHF (congestive heart failure) (HCC)   Discharged Condition: STABLE  Hospital Course:  ADMITTED FOR ACUTE CHF EXACERBATION CODE STROKE CALLED BUT PATIENT DID NOT WANT TO TALK AND HE WAS TIRED, NO SIGNS OF ACUTE CVA  PATIENTS SON AT BEDSIDE HE WANTS TO TAKE FATHER HOME STATES THAT PATIENT IS BETTER THAN EVER ACCORDING TO THE SON     Treatments:  LASIX, BETA BLOCKER  Discharge Exam: Blood pressure (!) 148/129, pulse 78, temperature 97.9 F (36.6 C), temperature source Axillary, resp. rate (!) 22, height 5\' 5"  (1.651 m), weight 67.4 kg, SpO2 96 %.   Disposition: HOME   Allergies as of 01/25/2019   No Known Allergies     Medication List    TAKE these medications   albuterol 108 (90 Base) MCG/ACT inhaler Commonly known as:  VENTOLIN HFA Inhale 1-2 puffs into the lungs every 4 (four) hours as needed.   Eliquis 5 MG Tabs tablet Generic drug:  apixaban Take 2.5 mg by mouth 2 (two) times daily.   finasteride 5 MG tablet Commonly known as:  PROSCAR TK 1 T PO Q 24 H   furosemide 40 MG tablet Commonly known as:  LASIX Take 40 mg by mouth daily.   lisinopril 10 MG tablet Commonly known as:  ZESTRIL TK 1 T PO  D   metoprolol tartrate 25 MG tablet Commonly known as:  LOPRESSOR Take 25 mg by mouth 2 (two) times daily.   omeprazole 20 MG capsule Commonly known as:  PRILOSEC Take 20 mg by mouth daily.   pravastatin 40 MG tablet Commonly known as:  PRAVACHOL TK 1 T PO HS   tamsulosin 0.4 MG Caps capsule Commonly known as:  FLOMAX TK ONE C PO QD      Follow-up Information    [facility] Follow up on 02/05/2019.   Specialty:  Cardiology Why:  at 12:00pm Contact information: [address] [place] [address] [phone]          Signed: Erin Fulling 01/25/2019, 3:29 PM

## 2019-01-25 NOTE — Progress Notes (Signed)
*  PRELIMINARY RESULTS* Echocardiogram 2D Echocardiogram has been performed.  Brian Baker 01/25/2019, 9:18 AM

## 2019-01-25 NOTE — Progress Notes (Signed)
Ch pg for RRT for pt. Pt is Arabic speaking. Pt son just so happened to be on his way up to give his meds from home. Son presented to be under a great deal of emotional distress. Ch helped pt son to the pt's room while he was receiving a CT scan. Ch had to leave son to attend to a code. Ch f/u with pt that reported pt was transferred to ICU.       01/25/19 1300  Clinical Encounter Type  Visited With Health care provider;Patient not available;Family  Visit Type Code;Critical Care  Referral From Nurse  Consult/Referral To Chaplain  Spiritual Encounters  Spiritual Needs Emotional;Grief support  Stress Factors  Patient Stress Factors Health changes;Major life changes  Family Stress Factors None identified

## 2019-02-05 ENCOUNTER — Other Ambulatory Visit: Payer: Self-pay

## 2019-02-05 ENCOUNTER — Ambulatory Visit: Payer: Medicaid Other | Attending: Family | Admitting: Family

## 2019-02-05 ENCOUNTER — Encounter: Payer: Self-pay | Admitting: Family

## 2019-02-05 DIAGNOSIS — I509 Heart failure, unspecified: Secondary | ICD-10-CM | POA: Diagnosis present

## 2019-02-05 DIAGNOSIS — E785 Hyperlipidemia, unspecified: Secondary | ICD-10-CM | POA: Diagnosis not present

## 2019-02-05 DIAGNOSIS — I4891 Unspecified atrial fibrillation: Secondary | ICD-10-CM | POA: Diagnosis not present

## 2019-02-05 DIAGNOSIS — N400 Enlarged prostate without lower urinary tract symptoms: Secondary | ICD-10-CM | POA: Diagnosis not present

## 2019-02-05 DIAGNOSIS — I5022 Chronic systolic (congestive) heart failure: Secondary | ICD-10-CM | POA: Diagnosis not present

## 2019-02-05 DIAGNOSIS — Z8673 Personal history of transient ischemic attack (TIA), and cerebral infarction without residual deficits: Secondary | ICD-10-CM | POA: Insufficient documentation

## 2019-02-05 DIAGNOSIS — J449 Chronic obstructive pulmonary disease, unspecified: Secondary | ICD-10-CM | POA: Insufficient documentation

## 2019-02-05 DIAGNOSIS — Z87891 Personal history of nicotine dependence: Secondary | ICD-10-CM | POA: Insufficient documentation

## 2019-02-05 DIAGNOSIS — Z79899 Other long term (current) drug therapy: Secondary | ICD-10-CM | POA: Diagnosis not present

## 2019-02-05 DIAGNOSIS — I110 Hypertensive heart disease with heart failure: Secondary | ICD-10-CM | POA: Insufficient documentation

## 2019-02-05 DIAGNOSIS — I10 Essential (primary) hypertension: Secondary | ICD-10-CM

## 2019-02-05 DIAGNOSIS — Z7901 Long term (current) use of anticoagulants: Secondary | ICD-10-CM | POA: Diagnosis not present

## 2019-02-05 NOTE — Patient Instructions (Addendum)
Continue weighing daily and call for an overnight weight gain of > 2 pounds or a weekly weight gain of >5 pounds.  Call us for above symptoms or to schedule a follow-up appointment

## 2019-02-05 NOTE — Progress Notes (Signed)
Patient ID: Brian Baker, male    DOB: 02/21/1936, 83 y.o.   MRN: [id number]  Entire visit was done in the presence of an interpreter   HPI  Brian Baker is a 83 y/o male with a history of HTN, hyperlipidemia, stroke, atrial fibrillation, COPD, dyslipidemia and chronic heart failure.   Echo report from 01/25/2019 reviewed and showed an EF of 35-40% along with mild/moderate Brian and TR and a PA pressure of 62.9 mmHg.  Admitted 01/24/2019 due to acute on chronic HF. Cardiology and neurology consults were done. Medications adjusted and discharged the next day. Was in the ED 11/23/2018 for acute on chronic heart failure where he was treated and released.   He presents today with a chief complaint of an initial visit. Currently doesn't have any symptoms and specifically denies any shortness of breath, chest pain, palpitations, pedal edema, abdominal distention, difficulty sleeping, fatigue or weight gain.   Past Medical History:  Diagnosis Date  . Arrhythmia    atrial fibrillation  . CHF (congestive heart failure) (HCC)   . COPD (chronic obstructive pulmonary disease) (HCC)   . Enlarged prostate   . Hyperlipemia   . Hypertension   . Stroke [facility](HCC)    History reviewed. No pertinent surgical history. History reviewed. No pertinent family history. Social History   Tobacco Use  . Smoking status: Former Games developermoker  . Smokeless tobacco: Never Used  Substance Use Topics  . Alcohol use: Not Currently   No Known Allergies   Prior to Admission medications   Medication Sig Start Date End Date Taking? Authorizing Provider  albuterol (VENTOLIN HFA) 108 (90 Base) MCG/ACT inhaler Inhale 1-2 puffs into the lungs every 4 (four) hours as needed.  05/27/18 05/27/19 Yes [provider]  ELIQUIS 5 MG TABS tablet Take 2.5 mg by mouth 2 (two) times daily.  01/09/19  Yes [provider]  finasteride (PROSCAR) 5 MG tablet TK 1 T PO Q 24 H 12/02/18  Yes [provider]  furosemide (LASIX) 40 MG tablet  Take 40 mg by mouth daily.  11/26/18  Yes [provider]  lisinopril (ZESTRIL) 10 MG tablet TK 1 T PO  D 12/27/18  Yes [provider]  metoprolol tartrate (LOPRESSOR) 25 MG tablet Take 25 mg by mouth 2 (two) times daily.  12/25/18  Yes [provider]  omeprazole (PRILOSEC) 20 MG capsule Take 20 mg by mouth daily.  12/25/18  Yes [provider]  pravastatin (PRAVACHOL) 40 MG tablet TK 1 T PO HS 01/04/19  Yes [provider]  tamsulosin (FLOMAX) 0.4 MG CAPS capsule TK ONE C PO QD 10/29/18  Yes [provider]    Review of Systems  Constitutional: Negative for appetite change and fatigue.  HENT: Negative for congestion, postnasal drip and sore throat.   Eyes: Negative.   Respiratory: Negative for cough and shortness of breath.   Cardiovascular: Negative for chest pain, palpitations and leg swelling.  Gastrointestinal: Negative for abdominal distention and abdominal pain.  Endocrine: Negative.   Genitourinary: Negative.   Musculoskeletal: Negative.   Skin: Negative.   Allergic/Immunologic: Negative.   Neurological: Negative for dizziness and light-headedness.  Hematological: Negative for adenopathy. Does not bruise/bleed easily.  Psychiatric/Behavioral: Negative for dysphoric mood and sleep disturbance. The patient is not nervous/anxious.    Vitals:   02/05/19 1208 02/05/19 1219  BP: (!) 157/100 140/60  Pulse: 80   Resp: 18   Temp: 97.6 F (36.4 C)   SpO2: 97%   Weight:  152 lb (68.9 kg)   Height: 5\' 7"  (1.702 m)    Wt Readings from Last 3 Encounters:  02/05/19 152 lb (68.9 kg)  01/25/19 148 lb 8 oz (67.4 kg)  11/23/18 146 lb 9.7 oz (66.5 kg)   Lab Results  Component Value Date   CREATININE 0.92 01/25/2019   CREATININE 1.07 01/24/2019   CREATININE 1.00 11/23/2018     Physical Exam Vitals signs and nursing note reviewed.  Constitutional:      Appearance: Normal appearance.  HENT:     Head: Normocephalic and atraumatic.   Neck:     Musculoskeletal: Normal range of motion and neck supple.  Cardiovascular:     Rate and Rhythm: Normal rate and regular rhythm.  Pulmonary:     Effort: Pulmonary effort is normal. No respiratory distress.     Breath sounds: No wheezing or rales.  Abdominal:     Palpations: Abdomen is soft.     Tenderness: There is no abdominal tenderness.  Musculoskeletal:        General: No tenderness.     Right lower leg: No edema.     Left lower leg: No edema.  Skin:    General: Skin is warm and dry.  Neurological:     General: No focal deficit present.     Mental Status: He is alert and oriented to person, place, and time.  Psychiatric:        Mood and Affect: Mood normal.        Behavior: Behavior normal.     Assessment & Plan:  1: Chronic heart failure with reduced ejection fraction- - NYHA class I - weighing daily and says that his weight has been stable. Instructed to call for an overnight weight gain of >2 pounds or a weekly weight gain of > 5 pounds - not adding salt or if he does, he uses lite-salt; family is trying to monitor for high-sodium foods - currently asymptomatic so would not qualify for entresto - saw cardiology Elisabeth Cara) 12/25/2018 - saw pulmonology Barrie Lyme) 08/13/18 - BNP 01/24/2019 was 955.0  2: HTN- - BP initially elevated but improved after recheck with the manual cuff - BMP from 01/25/2019 reviewed and showed sodium 143, potassium 2.7, creatinine 0.92 and GFR >60 - should get labs repeated due to hypokalemia but they will wait until they see his cardiologist  Patient did not bring his medications nor a list. Each medication was verbally reviewed with the patient and he was encouraged to bring the bottles to every visit to confirm accuracy of list.  Patient and son opt to not make a return appointment at this. Explained that they could call back at anytime to make another appointment.   Entire visit was done in the presence of an interpreter.

## 2019-03-11 ENCOUNTER — Emergency Department
Admission: EM | Admit: 2019-03-11 | Discharge: 2019-03-11 | Disposition: A | Discharge status: Home / Self Care | Payer: Medicaid Other | Attending: Emergency Medicine | Admitting: Emergency Medicine

## 2019-03-11 ENCOUNTER — Other Ambulatory Visit: Payer: Self-pay

## 2019-03-11 ENCOUNTER — Emergency Department: Payer: Medicaid Other

## 2019-03-11 DIAGNOSIS — Z87891 Personal history of nicotine dependence: Secondary | ICD-10-CM | POA: Diagnosis not present

## 2019-03-11 DIAGNOSIS — I5022 Chronic systolic (congestive) heart failure: Secondary | ICD-10-CM | POA: Diagnosis not present

## 2019-03-11 DIAGNOSIS — Z20828 Contact with and (suspected) exposure to other viral communicable diseases: Secondary | ICD-10-CM | POA: Diagnosis not present

## 2019-03-11 DIAGNOSIS — Z8673 Personal history of transient ischemic attack (TIA), and cerebral infarction without residual deficits: Secondary | ICD-10-CM | POA: Insufficient documentation

## 2019-03-11 DIAGNOSIS — R531 Weakness: Secondary | ICD-10-CM | POA: Insufficient documentation

## 2019-03-11 DIAGNOSIS — Z79899 Other long term (current) drug therapy: Secondary | ICD-10-CM | POA: Diagnosis not present

## 2019-03-11 DIAGNOSIS — I110 Hypertensive heart disease with heart failure: Secondary | ICD-10-CM | POA: Diagnosis not present

## 2019-03-11 DIAGNOSIS — Z7901 Long term (current) use of anticoagulants: Secondary | ICD-10-CM | POA: Insufficient documentation

## 2019-03-11 DIAGNOSIS — N390 Urinary tract infection, site not specified: Secondary | ICD-10-CM

## 2019-03-11 DIAGNOSIS — J449 Chronic obstructive pulmonary disease, unspecified: Secondary | ICD-10-CM | POA: Diagnosis not present

## 2019-03-11 LAB — CBC WITH DIFFERENTIAL/PLATELET
Abs Immature Granulocytes: 0.06 10*3/uL (ref 0.00–0.07)
Basophils Absolute: 0 10*3/uL (ref 0.0–0.1)
Basophils Relative: 0 %
Eosinophils Absolute: 0.2 10*3/uL (ref 0.0–0.5)
Eosinophils Relative: 3 %
HCT: 42.2 % (ref 39.0–52.0)
Hemoglobin: 12.9 g/dL — ABNORMAL LOW (ref 13.0–17.0)
Immature Granulocytes: 1 %
Lymphocytes Relative: 18 %
Lymphs Abs: 1.3 10*3/uL (ref 0.7–4.0)
MCH: 25.4 pg — ABNORMAL LOW (ref 26.0–34.0)
MCHC: 30.6 g/dL (ref 30.0–36.0)
MCV: 83.2 fL (ref 80.0–100.0)
Monocytes Absolute: 0.6 10*3/uL (ref 0.1–1.0)
Monocytes Relative: 8 %
Neutro Abs: 5.1 10*3/uL (ref 1.7–7.7)
Neutrophils Relative %: 70 %
Platelets: 105 10*3/uL — ABNORMAL LOW (ref 150–400)
RBC: 5.07 MIL/uL (ref 4.22–5.81)
RDW: 15.3 % (ref 11.5–15.5)
WBC: 7.3 10*3/uL (ref 4.0–10.5)
nRBC: 0 % (ref 0.0–0.2)

## 2019-03-11 LAB — SARS CORONAVIRUS 2 BY RT PCR (HOSPITAL ORDER, PERFORMED IN ~~LOC~~ HOSPITAL LAB): SARS Coronavirus 2: NEGATIVE

## 2019-03-11 LAB — URINALYSIS, COMPLETE (UACMP) WITH MICROSCOPIC
Bilirubin Urine: NEGATIVE
Glucose, UA: NEGATIVE mg/dL
Hgb urine dipstick: NEGATIVE
Ketones, ur: NEGATIVE mg/dL
Nitrite: NEGATIVE
Protein, ur: 100 mg/dL — AB
Specific Gravity, Urine: 1.008 (ref 1.005–1.030)
Squamous Epithelial / HPF: NONE SEEN (ref 0–5)
pH: 8 (ref 5.0–8.0)

## 2019-03-11 LAB — COMPREHENSIVE METABOLIC PANEL
ALT: 14 U/L (ref 0–44)
AST: 19 U/L (ref 15–41)
Albumin: 4.9 g/dL (ref 3.5–5.0)
Alkaline Phosphatase: 83 U/L (ref 38–126)
Anion gap: 12 (ref 5–15)
BUN: 23 mg/dL (ref 8–23)
CO2: 30 mmol/L (ref 22–32)
Calcium: 9.7 mg/dL (ref 8.9–10.3)
Chloride: 99 mmol/L (ref 98–111)
Creatinine, Ser: 0.82 mg/dL (ref 0.61–1.24)
GFR calc Af Amer: >60 mL/min (ref >60)
GFR calc non Af Amer: >60 mL/min (ref >60)
Glucose, Bld: 146 mg/dL — ABNORMAL HIGH (ref 70–99)
Potassium: 3.3 mmol/L — ABNORMAL LOW (ref 3.5–5.1)
Sodium: 141 mmol/L (ref 135–145)
Total Bilirubin: 2.4 mg/dL — ABNORMAL HIGH (ref 0.3–1.2)
Total Protein: 8.1 g/dL (ref 6.5–8.1)

## 2019-03-11 LAB — TROPONIN I (HIGH SENSITIVITY)
Troponin I (High Sensitivity): 36 ng/L — ABNORMAL HIGH (ref <18)
Troponin I (High Sensitivity): 34 ng/L — ABNORMAL HIGH (ref <18)

## 2019-03-11 MED ORDER — SODIUM CHLORIDE 0.9 % IV SOLN
1.0000 g | Freq: Once | INTRAVENOUS | Status: AC
  Administered 2019-03-11: 1 g via INTRAVENOUS
  Filled 2019-03-11: qty 10

## 2019-03-11 MED ORDER — CEPHALEXIN 500 MG PO CAPS: 500.0000 mg | ORAL_CAPSULE | Freq: Three times a day (TID) | ORAL | 0 refills | Status: AC

## 2019-03-11 NOTE — ED Notes (Signed)
Pt son screened to stay with pt d/t language barrier and issue with speech d/t stroke

## 2019-03-11 NOTE — ED Provider Notes (Addendum)
[facility] Emergency Department Provider Note  ____________________________________________   I have reviewed the triage vital signs and the nursing notes. Where available I have reviewed prior notes and, if possible and indicated, outside hospital notes.    HISTORY  Chief Complaint Weakness  Patient seen and evaluated during the coronavirus epidemic during a time with low staffing   HPI Brian Baker is a 83 y.o. male with a history of CHF COPD enlarged prostate hyperlipidemia hypertension, atrial fibrillation right-sided weakness from prior CVA, who presents today complaining of feeling just generally slightly down.  He states that he has been somewhat mildly weak.  He does not have any other focal symptoms.  He has chronic right-sided weakness but no change in that.  No cough no shortness of breath no nausea no vomiting no headache no stiff neck just feels generally weak.  Family are concerned that maybe his energy level is down they want a make sure nothing else is going on.    Past Medical History:  Diagnosis Date  . Arrhythmia    atrial fibrillation  . CHF (congestive heart failure) (HCC)   . COPD (chronic obstructive pulmonary disease) (HCC)   . Enlarged prostate   . Hyperlipemia   . Hypertension   . Stroke [facility](HCC)     Patient Active Problem List   Diagnosis Date Noted  . Chronic systolic heart failure (HCC) 02/05/2019  . HTN (hypertension) 02/05/2019  . Acute CHF (congestive heart failure) (HCC) 01/24/2019    History reviewed. No pertinent surgical history.  Prior to Admission medications   Medication Sig Start Date End Date Taking? Authorizing Provider  albuterol (VENTOLIN HFA) 108 (90 Base) MCG/ACT inhaler Inhale 1-2 puffs into the lungs every 4 (four) hours as needed.  05/27/18 05/27/19  [provider]  ELIQUIS 5 MG TABS tablet Take 2.5 mg by mouth 2 (two) times daily.  01/09/19   [provider]  finasteride (PROSCAR) 5 MG  tablet TK 1 T PO Q 24 H 12/02/18   [provider]  furosemide (LASIX) 40 MG tablet Take 40 mg by mouth daily.  11/26/18   [provider]  lisinopril (ZESTRIL) 10 MG tablet TK 1 T PO  D 12/27/18   [provider]  metoprolol tartrate (LOPRESSOR) 25 MG tablet Take 25 mg by mouth 2 (two) times daily.  12/25/18   [provider]  omeprazole (PRILOSEC) 20 MG capsule Take 20 mg by mouth daily.  12/25/18   [provider]  pravastatin (PRAVACHOL) 40 MG tablet TK 1 T PO HS 01/04/19   [provider]  tamsulosin (FLOMAX) 0.4 MG CAPS capsule TK ONE C PO QD 10/29/18   [provider]    Allergies Patient has no known allergies.  No family history on file.  Social History Social History   Tobacco Use  . Smoking status: Former Games developermoker  . Smokeless tobacco: Never Used  Substance Use Topics  . Alcohol use: Not Currently  . Drug use: Never    Review of Systems Constitutional: No fever/chills Eyes: No visual changes. ENT: No sore throat. No stiff neck no neck pain Cardiovascular: Denies chest pain. Respiratory: Denies shortness of breath. Gastrointestinal:   no vomiting.  No diarrhea.  No constipation. Genitourinary: Negative for dysuria. Musculoskeletal: Negative lower extremity swelling Skin: Negative for rash. Neurological: Negative for severe headaches, focal weakness or numbness.   ____________________________________________   PHYSICAL EXAM:  VITAL SIGNS: ED Triage Vitals  Enc Vitals Group  BP 03/11/19 1508 (!) 152/93     Pulse Rate 03/11/19 1508 60     Resp 03/11/19 1508 16     Temp 03/11/19 1508 99.1 F (37.3 C)     Temp Source 03/11/19 1508 Oral     SpO2 03/11/19 1508 90 %     Weight 03/11/19 1509 160 lb (72.6 kg)     Height 03/11/19 1509 5\' 7"  (1.702 m)     Head Circumference --      Peak Flow --      Pain Score 03/11/19 1509 0     Pain Loc --      Pain Edu? --      Excl. in GC? --     Constitutional: Well  appearing and in no acute distress. Eyes: Conjunctivae are normal Head: Atraumatic HEENT: No congestion/rhinnorhea. Mucous membranes are moist.  Oropharynx non-erythematous Neck:   Nontender with no meningismus, no masses, no stridor Cardiovascular: Normal rate, regular rhythm. Grossly normal heart sounds.  Good peripheral circulation. Respiratory: Normal respiratory effort.  No retractions. Lungs CTAB. Abdominal: Soft and nontender. No distention. No guarding no rebound Back:  There is no focal tenderness or step off.  there is no midline tenderness there are no lesions noted. there is no CVA tenderness Musculoskeletal: No lower extremity tenderness, no upper extremity tenderness. No joint effusions, no DVT signs strong distal pulses no edema Neurologic: Slight neurologic deficits including right-sided deficit noted from prior stroke baseline per family Skin:  Skin is warm, dry and intact. No rash noted. Psychiatric: Mood and affect are normal.   ____________________________________________   LABS (all labs ordered are listed, but only abnormal results are displayed)  Labs Reviewed  URINALYSIS, COMPLETE (UACMP) WITH MICROSCOPIC - Abnormal; Notable for the following components:      Result Value   Color, Urine YELLOW (*)    APPearance CLEAR (*)    Protein, ur 100 (*)    Leukocytes,Ua MODERATE (*)    Bacteria, UA MANY (*)    All other components within normal limits  COMPREHENSIVE METABOLIC PANEL - Abnormal; Notable for the following components:   Potassium 3.3 (*)    Glucose, Bld 146 (*)    Total Bilirubin 2.4 (*)    All other components within normal limits  CBC WITH DIFFERENTIAL/PLATELET - Abnormal; Notable for the following components:   Hemoglobin 12.9 (*)    MCH 25.4 (*)    Platelets 105 (*)    All other components within normal limits  SARS CORONAVIRUS 2 (HOSPITAL ORDER, PERFORMED IN [facility])  URINE CULTURE  TROPONIN I (HIGH SENSITIVITY)    Pertinent  labs  results that were available during my care of the patient were reviewed by me and considered in my medical decision making (see chart for details). ____________________________________________  EKG  I personally interpreted any EKGs ordered by me or triage  ____________________________________________  RADIOLOGY  Pertinent labs & imaging results that were available during my care of the patient were reviewed by me and considered in my medical decision making (see chart for details). If possible, patient and/or family made aware of any abnormal findings.  Ct Head Wo Contrast  Result Date: 03/11/2019 CLINICAL DATA:  Weakness EXAM: CT HEAD WITHOUT CONTRAST TECHNIQUE: Contiguous axial images were obtained from the base of the skull through the vertex without intravenous contrast. COMPARISON:  01/25/2019 FINDINGS: Brain: There again noted changes consistent with left MCA infarct primarily anteriorly with some cephalo malacia and mild dilatation of the  left lateral ventricle. These changes are stable from the recent exam. No findings to suggest acute hemorrhage, acute infarction or space-occupying mass lesion are noted. Basal ganglia calcifications on the left are noted and stable. Diffuse atrophic changes are again identified and stable. Vascular: No hyperdense vessel or unexpected calcification. Skull: Normal. Negative for fracture or focal lesion. Sinuses/Orbits: Orbits are within normal limits. Mild mucosal thickening in the left maxillary antrum is seen slightly increased when compared with prior exam. Other: None. IMPRESSION: Chronic changes consistent with left MCA infarct. Chronic atrophic and ischemic changes. No acute abnormality is noted. Electronically Signed   By: Inez Catalina M.D.   On: 03/11/2019 17:37   Dg Chest Port 1 View  Result Date: 03/11/2019 CLINICAL DATA:  Weakness EXAM: PORTABLE CHEST 1 VIEW COMPARISON:  01/24/2019 FINDINGS: Cardiac shadow is enlarged but stable. Aortic  calcifications are again seen. Mild central vascular congestion is again noted. Some patchy infiltrative changes are noted in the bases bilaterally but relatively stable from the prior study. Mild interstitial edema is seen. IMPRESSION: Patchy bibasilar infiltrate/atelectasis. Mild interstitial edema. Electronically Signed   By: Inez Catalina M.D.   On: 03/11/2019 17:52   ____________________________________________    PROCEDURES  Procedure(s) performed: None  Procedures  Critical Care performed: None  ____________________________________________   INITIAL IMPRESSION / ASSESSMENT AND PLAN / ED COURSE  Pertinent labs & imaging results that were available during my care of the patient were reviewed by me and considered in my medical decision making (see chart for details).  Challenging patient because of significant baseline medical problems however, feeling generally weak today blood pressure somewhat elevated but family states this is not unusual when he is in the emergency department.  I believe is asymptomatic from this.  Does however have a urinary tract infection, with many bacteria many whites and positive leuks which I think likely is causing him to feel generally low today.  We are giving him IV antibiotics.  White count is normal vital signs besides chronically elevated blood pressure are reassuring, troponin is pending, low suspicion for ACS however given his chronic EKG changes I did send that.  Chest x-ray does not show any acute evidence of infection and his sats are good on his home oxygen.  Not complaining of shortness of breath or cough.  However we will see if he has coronavirus as well those 2 things are pending.  If they are negative it is my hope that we can get him home  ----------------------------------------- 8:13 PM on 03/11/2019 -----------------------------------------  Patient and family are very very anxious, they refuse any further attention they want the IV  pulled right now and they want to go home.  They state they are not of sudden anyone they just do not want to be here.  They are worried that he will catch coronavirus.  I have tried to explain to them why we kept them because of the repeat troponin.  Patient and family refused admission they understand the findings here we will discharge them at their insistence.  They are very adamant that they will go and pull the IV themselves if they are not immediately discharge at this moment.  Return precautions follow-up given and understood.    ____________________________________________   FINAL CLINICAL IMPRESSION(S) / ED DIAGNOSES  Final diagnoses:  Weakness      This chart was dictated using voice recognition software.  Despite best efforts to proofread,  errors can occur which can change meaning.  Jeanmarie PlantMcShane, Jimia Gentles A, MD 03/11/19 [id number]    Jeanmarie PlantMcShane, Karsen Fellows A, MD 03/11/19 2013

## 2019-03-11 NOTE — ED Triage Notes (Signed)
Pt had decreased energy yesterday and then today started with dizziness and bradycardia

## 2019-03-11 NOTE — Discharge Instructions (Signed)
You do not want to stay for further evaluation which is certainly your choice however if you feel worse in any way please return to the emergency room as discussed.

## 2019-03-11 NOTE — ED Notes (Signed)
Pt  Given urinal, son is helping pt give sample at this time

## 2019-03-11 NOTE — ED Notes (Signed)
Pt desat to 87% - placed on 2L O2 via n/c

## 2019-03-12 ENCOUNTER — Emergency Department: Payer: Medicaid Other

## 2019-03-12 ENCOUNTER — Other Ambulatory Visit: Payer: Self-pay

## 2019-03-12 ENCOUNTER — Inpatient Hospital Stay
Admission: EM | Admit: 2019-03-12 | Discharge: 2019-03-14 | DRG: 291 | Disposition: A | Discharge status: Home / Self Care | Payer: Medicaid Other | Attending: Internal Medicine | Admitting: Internal Medicine

## 2019-03-12 ENCOUNTER — Encounter: Payer: Self-pay | Admitting: Emergency Medicine

## 2019-03-12 DIAGNOSIS — I77810 Thoracic aortic ectasia: Secondary | ICD-10-CM | POA: Diagnosis present

## 2019-03-12 DIAGNOSIS — I4821 Permanent atrial fibrillation: Secondary | ICD-10-CM

## 2019-03-12 DIAGNOSIS — K219 Gastro-esophageal reflux disease without esophagitis: Secondary | ICD-10-CM | POA: Diagnosis present

## 2019-03-12 DIAGNOSIS — N3000 Acute cystitis without hematuria: Secondary | ICD-10-CM | POA: Diagnosis present

## 2019-03-12 DIAGNOSIS — D649 Anemia, unspecified: Secondary | ICD-10-CM | POA: Diagnosis present

## 2019-03-12 DIAGNOSIS — Z87891 Personal history of nicotine dependence: Secondary | ICD-10-CM

## 2019-03-12 DIAGNOSIS — I69322 Dysarthria following cerebral infarction: Secondary | ICD-10-CM

## 2019-03-12 DIAGNOSIS — I5043 Acute on chronic combined systolic (congestive) and diastolic (congestive) heart failure: Secondary | ICD-10-CM

## 2019-03-12 DIAGNOSIS — Z9981 Dependence on supplemental oxygen: Secondary | ICD-10-CM

## 2019-03-12 DIAGNOSIS — J9621 Acute and chronic respiratory failure with hypoxia: Secondary | ICD-10-CM | POA: Diagnosis present

## 2019-03-12 DIAGNOSIS — I160 Hypertensive urgency: Secondary | ICD-10-CM | POA: Diagnosis present

## 2019-03-12 DIAGNOSIS — I482 Chronic atrial fibrillation, unspecified: Secondary | ICD-10-CM

## 2019-03-12 DIAGNOSIS — E876 Hypokalemia: Secondary | ICD-10-CM | POA: Diagnosis present

## 2019-03-12 DIAGNOSIS — R001 Bradycardia, unspecified: Secondary | ICD-10-CM | POA: Diagnosis present

## 2019-03-12 DIAGNOSIS — B9620 Unspecified Escherichia coli [E. coli] as the cause of diseases classified elsewhere: Secondary | ICD-10-CM | POA: Diagnosis present

## 2019-03-12 DIAGNOSIS — N400 Enlarged prostate without lower urinary tract symptoms: Secondary | ICD-10-CM | POA: Diagnosis present

## 2019-03-12 DIAGNOSIS — I5021 Acute systolic (congestive) heart failure: Secondary | ICD-10-CM

## 2019-03-12 DIAGNOSIS — E785 Hyperlipidemia, unspecified: Secondary | ICD-10-CM | POA: Diagnosis present

## 2019-03-12 DIAGNOSIS — I340 Nonrheumatic mitral (valve) insufficiency: Secondary | ICD-10-CM | POA: Diagnosis present

## 2019-03-12 DIAGNOSIS — Z7901 Long term (current) use of anticoagulants: Secondary | ICD-10-CM

## 2019-03-12 DIAGNOSIS — I110 Hypertensive heart disease with heart failure: Principal | ICD-10-CM | POA: Diagnosis present

## 2019-03-12 DIAGNOSIS — I509 Heart failure, unspecified: Secondary | ICD-10-CM

## 2019-03-12 DIAGNOSIS — I272 Pulmonary hypertension, unspecified: Secondary | ICD-10-CM | POA: Diagnosis present

## 2019-03-12 DIAGNOSIS — I10 Essential (primary) hypertension: Secondary | ICD-10-CM

## 2019-03-12 DIAGNOSIS — J9601 Acute respiratory failure with hypoxia: Secondary | ICD-10-CM

## 2019-03-12 DIAGNOSIS — J449 Chronic obstructive pulmonary disease, unspecified: Secondary | ICD-10-CM | POA: Diagnosis present

## 2019-03-12 LAB — CBC WITH DIFFERENTIAL/PLATELET
Abs Immature Granulocytes: 0.08 10*3/uL — ABNORMAL HIGH (ref 0.00–0.07)
Basophils Absolute: 0 10*3/uL (ref 0.0–0.1)
Basophils Relative: 0 %
Eosinophils Absolute: 0.1 10*3/uL (ref 0.0–0.5)
Eosinophils Relative: 1 %
HCT: 40.4 % (ref 39.0–52.0)
Hemoglobin: 12.5 g/dL — ABNORMAL LOW (ref 13.0–17.0)
Immature Granulocytes: 1 %
Lymphocytes Relative: 5 %
Lymphs Abs: 0.6 10*3/uL — ABNORMAL LOW (ref 0.7–4.0)
MCH: 25.7 pg — ABNORMAL LOW (ref 26.0–34.0)
MCHC: 30.9 g/dL (ref 30.0–36.0)
MCV: 83.1 fL (ref 80.0–100.0)
Monocytes Absolute: 0.7 10*3/uL (ref 0.1–1.0)
Monocytes Relative: 6 %
Neutro Abs: 10 10*3/uL — ABNORMAL HIGH (ref 1.7–7.7)
Neutrophils Relative %: 87 %
Platelets: 108 10*3/uL — ABNORMAL LOW (ref 150–400)
RBC: 4.86 MIL/uL (ref 4.22–5.81)
RDW: 15.1 % (ref 11.5–15.5)
WBC: 11.5 10*3/uL — ABNORMAL HIGH (ref 4.0–10.5)
nRBC: 0 % (ref 0.0–0.2)

## 2019-03-12 NOTE — ED Notes (Signed)
Patient came to room 19 with SOB O2 in the 80's. Patient placed on 4 liter O2.  O2 stats currently WDL.

## 2019-03-12 NOTE — ED Triage Notes (Signed)
Pt to triage via w/c, mask in place; seen yesterday and dx with UTI; pt accomp by daughter who reports pt with cough, weakness, not feeling well; [facility]

## 2019-03-12 NOTE — ED Notes (Signed)
Daughter is in with patient.

## 2019-03-12 NOTE — ED Provider Notes (Signed)
[facility] Emergency Department Provider Note  ____________________________________________   First MD Initiated Contact with Patient 03/12/19 2311     (approximate)  I have reviewed the triage vital signs and the nursing notes.   HISTORY  Chief Complaint Weakness  Level 5 caveat:  history/ROS limited by acute/critical illness as well as a language barrier.  The patient's daughter-in-law is present in the room and prefers to provide language assistance.  The patient indicated he wished his daughter-in-law to speak with him.  They understand that a hospital provided interpreter is available.  HPI Brian Baker is a 83 y.o. male with extensive chronic medical history as listed below including some chronic right-sided deficits after prior stroke as per prior documentation in his chart.  The patient was evaluated yesterday for some generalized weakness and diagnosed with a urinary tract infection; he had an extensive work-up and at the time had no respiratory complaints or concerns.  He tested negative for COVID-19 and his daughter-in-law confirms that there have been no cases within the family and the patient does not get out or around and has not been in contact with anyone with COVID-19.  The patient uses 2 L of oxygen by nasal cannula as needed at home but he really only uses at night.  Today he gradually got worse over the course the day with increasing weakness and shortness of breath.  When they came into the emergency department he had been off of his oxygen for a very short period of time and his oxygen level is in the low 80s and he has significant increased work of breathing and wheezing.  He was placed on 4 L of oxygen and is currently satting in the mid to upper 90s.  He is, however, still in some mild respiratory distress.  The symptoms have developed over the last 24 hours and his daughter-in-law confirms that they were not present yesterday.  He has not had  any known fever.  He recently was started on antibiotics for the urinary tract infection diagnosed yesterday.  He complains of no pain at this time including sore throat, chest pain, and abdominal pain.  He indicates shortness of breath only.   His daughter confirmed that he does have a history of CHF and that he sees a cardiologist in [place].  He has reportedly been compliant with his Lasix.        Past Medical History:  Diagnosis Date   Arrhythmia    atrial fibrillation   CHF (congestive heart failure) (HCC)    COPD (chronic obstructive pulmonary disease) (HCC)    Enlarged prostate    Hyperlipemia    Hypertension    Stroke [facility](HCC)     Patient Active Problem List   Diagnosis Date Noted   Chronic systolic heart failure (HCC) 02/05/2019   HTN (hypertension) 02/05/2019   Acute CHF (congestive heart failure) (HCC) 01/24/2019    History reviewed. No pertinent surgical history.  Prior to Admission medications   Medication Sig Start Date End Date Taking? Authorizing Provider  albuterol (VENTOLIN HFA) 108 (90 Base) MCG/ACT inhaler Inhale 1-2 puffs into the lungs every 4 (four) hours as needed.  05/27/18 05/27/19 Yes [provider]  cephALEXin (KEFLEX) 500 MG capsule Take 1 capsule (500 mg total) by mouth 3 (three) times daily for 7 days. 03/11/19 03/18/19 Yes McShane, Rudy JewJames A, MD  ELIQUIS 5 MG TABS tablet Take 2.5 mg by mouth 2 (two) times daily.  01/09/19  Yes [provider]  finasteride (PROSCAR) 5 MG tablet TK 1 T PO Q 24 H 12/02/18  Yes [provider]  furosemide (LASIX) 40 MG tablet Take 40 mg by mouth daily.  11/26/18  Yes [provider]  lisinopril (ZESTRIL) 10 MG tablet TK 1 T PO  D 12/27/18  Yes [provider]  metoprolol tartrate (LOPRESSOR) 25 MG tablet Take 25 mg by mouth 2 (two) times daily.  12/25/18  Yes [provider]  omeprazole (PRILOSEC) 20 MG capsule Take 20 mg by mouth daily.  12/25/18  Yes [provider]    pravastatin (PRAVACHOL) 40 MG tablet TK 1 T PO HS 01/04/19  Yes [provider]  tamsulosin (FLOMAX) 0.4 MG CAPS capsule TK ONE C PO QD 10/29/18  Yes [provider]    Allergies Patient has no known allergies.  No family history on file.  Social History Social History   Tobacco Use   Smoking status: Former Smoker   Smokeless tobacco: Never Used  Substance Use Topics   Alcohol use: Not Currently   Drug use: Never    Review of Systems Level 5 caveat:  history/ROS limited by acute/critical illness as well as a language barrier.  The patient's daughter-in-law is present in the room and prefers to provide language assistance.  The patient indicated he wished his daughter-in-law to speak with him.  They understand that a hospital provided interpreter is available.  Constitutional: No fever/chills ENT: No sore throat. Cardiovascular: Denies chest pain. Respiratory: +shortness of breath. Gastrointestinal: No abdominal pain.  No nausea, no vomiting.  No diarrhea.  No constipation. Decreased oral intake today. Genitourinary: Negative for dysuria. Recently diagnosed with UTI. Musculoskeletal: Negative for neck pain.  Negative for back pain. Integumentary: Negative for rash. Neurological: Negative for headaches, focal weakness or numbness.   ____________________________________________   PHYSICAL EXAM:  VITAL SIGNS: ED Triage Vitals [03/12/19 2218]  Enc Vitals Group     BP (!) 172/99     Pulse Rate (!) 123     Resp (!) 24     Temp 98.9 F (37.2 C)     Temp Source Oral     SpO2 (!) 85 %     Weight      Height      Head Circumference      Peak Flow      Pain Score      Pain Loc      Pain Edu?      Excl. in GC?     Constitutional: Alert and oriented.  Appears chronically ill and in mild respiratory distress. Eyes: Conjunctivae are normal.  Head: Atraumatic. Nose: No congestion/rhinnorhea. Mouth/Throat: Mucous membranes are moist. Neck: No  stridor.  No meningeal signs.   Cardiovascular: Tachycardia, regular rhythm. Good peripheral circulation. Grossly normal heart sounds. Respiratory: Increased respiratory effort with accessory muscle usage and intercostal muscle retractions.  The patient is audibly wheezing even without the use of a stethoscope.  He has some crackles and coarse breath sounds with stethoscope auscultation. Gastrointestinal: Soft and nontender. No distention.  Musculoskeletal: No lower extremity tenderness nor edema. No gross deformities of extremities. Neurologic:  Normal speech and language. No gross focal neurologic deficits are appreciated.  Skin:  Skin is warm, dry and intact. No rash noted.  ____________________________________________   LABS (all labs ordered are listed, but only abnormal results are displayed)  Labs Reviewed  CBC WITH DIFFERENTIAL/PLATELET - Abnormal; Notable for the following components:      Result Value  WBC 11.5 (*)    Hemoglobin 12.5 (*)    MCH 25.7 (*)    Platelets 108 (*)    Neutro Abs 10.0 (*)    Lymphs Abs 0.6 (*)    Abs Immature Granulocytes 0.08 (*)    All other components within normal limits  COMPREHENSIVE METABOLIC PANEL - Abnormal; Notable for the following components:   Potassium 3.2 (*)    Glucose, Bld 190 (*)    BUN 25 (*)    Total Bilirubin 1.8 (*)    All other components within normal limits  BRAIN NATRIURETIC PEPTIDE - Abnormal; Notable for the following components:   B Natriuretic Peptide 611.0 (*)    All other components within normal limits  URINALYSIS, ROUTINE W REFLEX MICROSCOPIC - Abnormal; Notable for the following components:   Color, Urine YELLOW (*)    APPearance CLEAR (*)    Hgb urine dipstick SMALL (*)    Protein, ur 30 (*)    Leukocytes,Ua LARGE (*)    WBC, UA >50 (*)    All other components within normal limits  TROPONIN I (HIGH SENSITIVITY) - Abnormal; Notable for the following components:   Troponin I (High Sensitivity) 41 (*)     All other components within normal limits  CULTURE, BLOOD (ROUTINE X 2)  CULTURE, BLOOD (ROUTINE X 2)  LACTIC ACID, PLASMA  PROCALCITONIN  LACTIC ACID, PLASMA  CBC WITH DIFFERENTIAL/PLATELET  COMPREHENSIVE METABOLIC PANEL  TSH  BRAIN NATRIURETIC PEPTIDE   ____________________________________________  EKG  ED ECG REPORT I, Loleta Roseory Shanara Schnieders, the attending physician, personally viewed and interpreted this ECG.  Date: 03/12/2019 EKG Time: 22: 30 Rate: 105 Rhythm: Atrial fibrillation with mild tachycardia QRS Axis: normal Intervals: Abnormal secondary to A. fib.  LVH. ST/T Wave abnormalities: Non-specific ST segment / T-wave changes, but no clear evidence of acute ischemia. Narrative Interpretation: no definitive evidence of acute ischemia; does not meet STEMI criteria.   ____________________________________________  RADIOLOGY I, Loleta Roseory Brandan Glauber, personally viewed and evaluated these images (plain radiographs) as part of my medical decision making, as well as reviewing the written report by the radiologist.  ED MD interpretation:  Pulmonary vascular congestion  Official radiology report(s): Dg Chest Portable 1 View  Result Date: 03/13/2019 CLINICAL DATA:  83 year old male with shortness of breath and wheezing. History of CHF. EXAM: PORTABLE CHEST 1 VIEW COMPARISON:  Chest radiograph dated 03/11/2019 FINDINGS: Stable cardiomegaly. Probable mild vascular congestion. Overall improved aeration of the lungs since the prior radiograph. Bibasilar atelectatic changes noted. No large pleural effusion. There is no pneumothorax. Atherosclerotic calcification of the aorta. No acute osseous pathology. IMPRESSION: Cardiomegaly with probable mild vascular congestion. Overall improved aeration of the lungs since the prior radiograph. Electronically Signed   By: Elgie CollardArash  Radparvar M.D.   On: 03/13/2019 00:28    ____________________________________________   PROCEDURES   Procedure(s) performed  (including Critical Care):  Procedures   ____________________________________________   INITIAL IMPRESSION / MDM / ASSESSMENT AND PLAN / ED COURSE  As part of my medical decision making, I reviewed the following data within the electronic MEDICAL RECORD NUMBER History obtained from family, Nursing notes reviewed and incorporated, Labs reviewed , EKG interpreted , Old EKG reviewed, Old chart reviewed, Radiograph reviewed , Discussed with admitting physician  and Notes from prior ED visits   Differential diagnosis includes, but is not limited to, CHF exacerbation, COPD exacerbation, pneumonia, PE, less likely COVID-19.  The patient was tested for coronavirus is over 24 hours ago and the test was negative.  He has had no known contacts and is afebrile.  He is hypertensive tonight in the emergency department as presentation is most consistent with CHF although he also has COPD listed as a diagnosis as well.  He is not having chest pain which is reassuring.  He is significantly hypertensive, and more so than he was yesterday.  Anticipate treating the blood pressure likely with some nitroglycerin paste but I would like to see the chest x-ray results first.  Lab work is pending.  Given his increased oxygen requirement and increased work of breathing anticipate he will need admission.  Family understands and agrees with the plan.      Clinical Course as of Mar 12 157  Wed Mar 13, 2019  0049 B Natriuretic Peptide(!): 611.0 [CF]  [id number] pulmonary vascular congestion, no obvious infiltrate  DG Chest Portable 1 View [CF]  0050 reassuring lactic acid  Lactic Acid, Venous: 1.7 [CF]  0055 The patient's work-up is consistent with CHF exacerbation.  I have ordered 1 inch of nitroglycerin paste and furosemide 40 mg IV.  I updated patient's daughter-in-law who is updating the patient.  I have contacted the hospitalist service via [facility] secure chat text  for admission.  I also verified that the patient does not require  repeat COVID-19 testing because he has been tested just over 24 hours ago.   [CF]  [id number] Dr. Arville Care acknowledged the admission and will admit the patient.   [CF]    Clinical Course User Index [CF] Loleta Rose, MD     ____________________________________________  FINAL CLINICAL IMPRESSION(S) / ED DIAGNOSES  Final diagnoses:  Acute respiratory failure with hypoxemia (HCC)  Acute on chronic congestive heart failure, unspecified heart failure type (HCC)  Chronic atrial fibrillation  Uncontrolled hypertension     MEDICATIONS GIVEN DURING THIS VISIT:  Medications  cephALEXin (KEFLEX) capsule 500 mg (has no administration in time range)  lisinopril (ZESTRIL) tablet 10 mg (has no administration in time range)  metoprolol tartrate (LOPRESSOR) tablet 25 mg (has no administration in time range)  pravastatin (PRAVACHOL) tablet 40 mg (has no administration in time range)  pantoprazole (PROTONIX) EC tablet 40 mg (has no administration in time range)  finasteride (PROSCAR) tablet 5 mg (has no administration in time range)  apixaban (ELIQUIS) tablet 2.5 mg (has no administration in time range)  tamsulosin (FLOMAX) capsule 0.4 mg (has no administration in time range)  albuterol (PROVENTIL) (2.5 MG/3ML) 0.083% nebulizer solution 3 mL (has no administration in time range)  sodium chloride flush (NS) 0.9 % injection 3 mL (has no administration in time range)  sodium chloride flush (NS) 0.9 % injection 3 mL (has no administration in time range)  0.9 %  sodium chloride infusion (has no administration in time range)  acetaminophen (TYLENOL) tablet 650 mg (has no administration in time range)  ondansetron (ZOFRAN) injection 4 mg (has no administration in time range)  furosemide (LASIX) injection 40 mg (has no administration in time range)  aspirin EC tablet 81 mg (has no administration in time range)  zolpidem (AMBIEN) tablet 5 mg (has no administration in time range)  furosemide (LASIX) injection  40 mg (40 mg Intravenous Given 03/13/19 0052)  nitroGLYCERIN (NITROGLYN) 2 % ointment 1 inch (1 inch Topical Given 03/13/19 0102)     ED Discharge Orders    None      *Please note:  Brian Baker was evaluated in Emergency Department on 03/13/2019 for the symptoms described in the history of present illness. He was evaluated  in the context of the global COVID-19 pandemic, which necessitated consideration that the patient might be at risk for infection with the SARS-CoV-2 virus that causes COVID-19. Institutional protocols and algorithms that pertain to the evaluation of patients at risk for COVID-19 are in a state of rapid change based on information released by regulatory bodies including the CDC and federal and state organizations. These policies and algorithms were followed during the patient's care in the ED.  Some ED evaluations and interventions may be delayed as a result of limited staffing during the pandemic.*  Note:  This document was prepared using Dragon voice recognition software and may include unintentional dictation errors.   Hinda Kehr, MD 03/13/19 [phone]

## 2019-03-13 DIAGNOSIS — B9620 Unspecified Escherichia coli [E. coli] as the cause of diseases classified elsewhere: Secondary | ICD-10-CM | POA: Diagnosis present

## 2019-03-13 DIAGNOSIS — I77810 Thoracic aortic ectasia: Secondary | ICD-10-CM | POA: Diagnosis present

## 2019-03-13 DIAGNOSIS — I69322 Dysarthria following cerebral infarction: Secondary | ICD-10-CM | POA: Diagnosis not present

## 2019-03-13 DIAGNOSIS — Z9981 Dependence on supplemental oxygen: Secondary | ICD-10-CM | POA: Diagnosis not present

## 2019-03-13 DIAGNOSIS — R001 Bradycardia, unspecified: Secondary | ICD-10-CM | POA: Diagnosis present

## 2019-03-13 DIAGNOSIS — I160 Hypertensive urgency: Secondary | ICD-10-CM | POA: Diagnosis present

## 2019-03-13 DIAGNOSIS — I5043 Acute on chronic combined systolic (congestive) and diastolic (congestive) heart failure: Secondary | ICD-10-CM | POA: Diagnosis present

## 2019-03-13 DIAGNOSIS — N400 Enlarged prostate without lower urinary tract symptoms: Secondary | ICD-10-CM | POA: Diagnosis present

## 2019-03-13 DIAGNOSIS — I482 Chronic atrial fibrillation, unspecified: Secondary | ICD-10-CM | POA: Diagnosis not present

## 2019-03-13 DIAGNOSIS — E876 Hypokalemia: Secondary | ICD-10-CM | POA: Diagnosis present

## 2019-03-13 DIAGNOSIS — I340 Nonrheumatic mitral (valve) insufficiency: Secondary | ICD-10-CM | POA: Diagnosis present

## 2019-03-13 DIAGNOSIS — I272 Pulmonary hypertension, unspecified: Secondary | ICD-10-CM | POA: Diagnosis present

## 2019-03-13 DIAGNOSIS — I4821 Permanent atrial fibrillation: Secondary | ICD-10-CM

## 2019-03-13 DIAGNOSIS — I110 Hypertensive heart disease with heart failure: Secondary | ICD-10-CM | POA: Diagnosis present

## 2019-03-13 DIAGNOSIS — N3000 Acute cystitis without hematuria: Secondary | ICD-10-CM | POA: Diagnosis present

## 2019-03-13 DIAGNOSIS — Z87891 Personal history of nicotine dependence: Secondary | ICD-10-CM | POA: Diagnosis not present

## 2019-03-13 DIAGNOSIS — J449 Chronic obstructive pulmonary disease, unspecified: Secondary | ICD-10-CM | POA: Diagnosis present

## 2019-03-13 DIAGNOSIS — Z7901 Long term (current) use of anticoagulants: Secondary | ICD-10-CM | POA: Diagnosis not present

## 2019-03-13 DIAGNOSIS — E785 Hyperlipidemia, unspecified: Secondary | ICD-10-CM | POA: Diagnosis present

## 2019-03-13 DIAGNOSIS — K219 Gastro-esophageal reflux disease without esophagitis: Secondary | ICD-10-CM | POA: Diagnosis present

## 2019-03-13 DIAGNOSIS — D649 Anemia, unspecified: Secondary | ICD-10-CM | POA: Diagnosis present

## 2019-03-13 DIAGNOSIS — J9621 Acute and chronic respiratory failure with hypoxia: Secondary | ICD-10-CM | POA: Diagnosis present

## 2019-03-13 LAB — URINE CULTURE: Culture: >=100000 — AB

## 2019-03-13 LAB — TSH: TSH: 2.136 u[IU]/mL (ref 0.350–4.500)

## 2019-03-13 LAB — CBC WITH DIFFERENTIAL/PLATELET
Abs Immature Granulocytes: 0.05 10*3/uL (ref 0.00–0.07)
Basophils Absolute: 0 10*3/uL (ref 0.0–0.1)
Basophils Relative: 0 %
Eosinophils Absolute: 0.2 10*3/uL (ref 0.0–0.5)
Eosinophils Relative: 2 %
HCT: 39.6 % (ref 39.0–52.0)
Hemoglobin: 12.5 g/dL — ABNORMAL LOW (ref 13.0–17.0)
Immature Granulocytes: 1 %
Lymphocytes Relative: 8 %
Lymphs Abs: 0.7 10*3/uL (ref 0.7–4.0)
MCH: 25.8 pg — ABNORMAL LOW (ref 26.0–34.0)
MCHC: 31.6 g/dL (ref 30.0–36.0)
MCV: 81.8 fL (ref 80.0–100.0)
Monocytes Absolute: 0.6 10*3/uL (ref 0.1–1.0)
Monocytes Relative: 7 %
Neutro Abs: 7.5 10*3/uL (ref 1.7–7.7)
Neutrophils Relative %: 82 %
Platelets: 109 10*3/uL — ABNORMAL LOW (ref 150–400)
RBC: 4.84 MIL/uL (ref 4.22–5.81)
RDW: 15.1 % (ref 11.5–15.5)
WBC: 9.1 10*3/uL (ref 4.0–10.5)
nRBC: 0 % (ref 0.0–0.2)

## 2019-03-13 LAB — COMPREHENSIVE METABOLIC PANEL
ALT: 14 U/L (ref 0–44)
ALT: 12 U/L (ref 0–44)
AST: 18 U/L (ref 15–41)
AST: 15 U/L (ref 15–41)
Albumin: 4.7 g/dL (ref 3.5–5.0)
Albumin: 4.2 g/dL (ref 3.5–5.0)
Alkaline Phosphatase: 79 U/L (ref 38–126)
Alkaline Phosphatase: 65 U/L (ref 38–126)
Anion gap: 11 (ref 5–15)
Anion gap: 10 (ref 5–15)
BUN: 25 mg/dL — ABNORMAL HIGH (ref 8–23)
BUN: 21 mg/dL (ref 8–23)
CO2: 29 mmol/L (ref 22–32)
CO2: 30 mmol/L (ref 22–32)
Calcium: 9.4 mg/dL (ref 8.9–10.3)
Calcium: 9 mg/dL (ref 8.9–10.3)
Chloride: 102 mmol/L (ref 98–111)
Chloride: 101 mmol/L (ref 98–111)
Creatinine, Ser: 0.95 mg/dL (ref 0.61–1.24)
Creatinine, Ser: 1.02 mg/dL (ref 0.61–1.24)
GFR calc Af Amer: >60 mL/min (ref >60)
GFR calc Af Amer: >60 mL/min (ref >60)
GFR calc non Af Amer: >60 mL/min (ref >60)
GFR calc non Af Amer: >60 mL/min (ref >60)
Glucose, Bld: 190 mg/dL — ABNORMAL HIGH (ref 70–99)
Glucose, Bld: 176 mg/dL — ABNORMAL HIGH (ref 70–99)
Potassium: 3.2 mmol/L — ABNORMAL LOW (ref 3.5–5.1)
Potassium: 3.1 mmol/L — ABNORMAL LOW (ref 3.5–5.1)
Sodium: 142 mmol/L (ref 135–145)
Sodium: 141 mmol/L (ref 135–145)
Total Bilirubin: 1.8 mg/dL — ABNORMAL HIGH (ref 0.3–1.2)
Total Bilirubin: 2.1 mg/dL — ABNORMAL HIGH (ref 0.3–1.2)
Total Protein: 7.9 g/dL (ref 6.5–8.1)
Total Protein: 7.1 g/dL (ref 6.5–8.1)

## 2019-03-13 LAB — LACTIC ACID, PLASMA
Lactic Acid, Venous: 1.7 mmol/L (ref 0.5–1.9)
Lactic Acid, Venous: 1.1 mmol/L (ref 0.5–1.9)

## 2019-03-13 LAB — URINALYSIS, ROUTINE W REFLEX MICROSCOPIC
Bacteria, UA: NONE SEEN
Bilirubin Urine: NEGATIVE
Glucose, UA: NEGATIVE mg/dL
Ketones, ur: NEGATIVE mg/dL
Nitrite: NEGATIVE
Protein, ur: 30 mg/dL — AB
Specific Gravity, Urine: 1.01 (ref 1.005–1.030)
WBC, UA: >50 WBC/hpf — ABNORMAL HIGH (ref 0–5)
pH: 7 (ref 5.0–8.0)

## 2019-03-13 LAB — MAGNESIUM: Magnesium: 1.8 mg/dL (ref 1.7–2.4)

## 2019-03-13 LAB — BRAIN NATRIURETIC PEPTIDE
B Natriuretic Peptide: 611 pg/mL — ABNORMAL HIGH (ref 0.0–100.0)
B Natriuretic Peptide: 721 pg/mL — ABNORMAL HIGH (ref 0.0–100.0)

## 2019-03-13 LAB — TROPONIN I (HIGH SENSITIVITY)
Troponin I (High Sensitivity): 41 ng/L — ABNORMAL HIGH (ref <18)
Troponin I (High Sensitivity): 43 ng/L — ABNORMAL HIGH (ref <18)
Troponin I (High Sensitivity): 45 ng/L — ABNORMAL HIGH (ref <18)

## 2019-03-13 LAB — PROCALCITONIN: Procalcitonin: <0.1 ng/mL

## 2019-03-13 MED ORDER — SODIUM CHLORIDE 0.9% FLUSH: 3.0000 mL | INTRAVENOUS | Status: DC | PRN

## 2019-03-13 MED ORDER — POTASSIUM CHLORIDE 20 MEQ PO PACK
40.0000 meq | PACK | Freq: Once | ORAL | Status: AC
  Administered 2019-03-13: 40 meq via ORAL
  Filled 2019-03-13: qty 2

## 2019-03-13 MED ORDER — ENOXAPARIN SODIUM 40 MG/0.4ML ~~LOC~~ SOLN: 40.0000 mg | SUBCUTANEOUS | Status: DC

## 2019-03-13 MED ORDER — ONDANSETRON HCL 4 MG/2ML IJ SOLN: 4.0000 mg | Freq: Four times a day (QID) | INTRAMUSCULAR | Status: DC | PRN

## 2019-03-13 MED ORDER — ALBUTEROL SULFATE (2.5 MG/3ML) 0.083% IN NEBU: 3.0000 mL | INHALATION_SOLUTION | RESPIRATORY_TRACT | Status: DC | PRN

## 2019-03-13 MED ORDER — APIXABAN 5 MG PO TABS
5.0000 mg | ORAL_TABLET | Freq: Two times a day (BID) | ORAL | Status: DC
  Administered 2019-03-13 – 2019-03-14 (×2): 5 mg via ORAL
  Filled 2019-03-13 (×2): qty 1

## 2019-03-13 MED ORDER — PANTOPRAZOLE SODIUM 40 MG PO TBEC
40.0000 mg | DELAYED_RELEASE_TABLET | Freq: Every day | ORAL | Status: DC
  Administered 2019-03-13 – 2019-03-14 (×2): 40 mg via ORAL
  Filled 2019-03-13 (×2): qty 1

## 2019-03-13 MED ORDER — NITROGLYCERIN 2 % TD OINT
1.0000 [in_us] | TOPICAL_OINTMENT | Freq: Once | TRANSDERMAL | Status: AC
  Administered 2019-03-13: 01:00:00 1 [in_us] via TOPICAL
  Filled 2019-03-13: qty 1

## 2019-03-13 MED ORDER — SODIUM CHLORIDE 0.9% FLUSH
3.0000 mL | Freq: Two times a day (BID) | INTRAVENOUS | Status: DC
  Administered 2019-03-13 – 2019-03-14 (×4): 3 mL via INTRAVENOUS

## 2019-03-13 MED ORDER — CEPHALEXIN 500 MG PO CAPS
500.0000 mg | ORAL_CAPSULE | Freq: Three times a day (TID) | ORAL | Status: DC
  Administered 2019-03-13 – 2019-03-14 (×4): 500 mg via ORAL
  Filled 2019-03-13 (×4): qty 1

## 2019-03-13 MED ORDER — SPIRONOLACTONE 25 MG PO TABS
12.5000 mg | ORAL_TABLET | Freq: Every day | ORAL | Status: DC
  Administered 2019-03-14: 12.5 mg via ORAL
  Filled 2019-03-13: qty 1
  Filled 2019-03-13: qty 0.5

## 2019-03-13 MED ORDER — FUROSEMIDE 10 MG/ML IJ SOLN
40.0000 mg | Freq: Once | INTRAMUSCULAR | Status: AC
  Administered 2019-03-13: 40 mg via INTRAVENOUS
  Filled 2019-03-13: qty 4

## 2019-03-13 MED ORDER — APIXABAN 2.5 MG PO TABS
2.5000 mg | ORAL_TABLET | Freq: Two times a day (BID) | ORAL | Status: DC
  Administered 2019-03-13: 2.5 mg via ORAL
  Filled 2019-03-13: qty 1

## 2019-03-13 MED ORDER — METOPROLOL TARTRATE 25 MG PO TABS
37.5000 mg | ORAL_TABLET | Freq: Two times a day (BID) | ORAL | Status: DC
  Administered 2019-03-13 – 2019-03-14 (×2): 37.5 mg via ORAL
  Filled 2019-03-13 (×2): qty 2

## 2019-03-13 MED ORDER — TAMSULOSIN HCL 0.4 MG PO CAPS
0.4000 mg | ORAL_CAPSULE | Freq: Every day | ORAL | Status: DC
  Administered 2019-03-13 – 2019-03-14 (×2): 0.4 mg via ORAL
  Filled 2019-03-13 (×2): qty 1

## 2019-03-13 MED ORDER — ACETAMINOPHEN 325 MG PO TABS: 650.0000 mg | ORAL_TABLET | ORAL | Status: DC | PRN

## 2019-03-13 MED ORDER — FINASTERIDE 5 MG PO TABS
5.0000 mg | ORAL_TABLET | Freq: Every day | ORAL | Status: DC
  Administered 2019-03-13 – 2019-03-14 (×2): 5 mg via ORAL
  Filled 2019-03-13 (×2): qty 1

## 2019-03-13 MED ORDER — FUROSEMIDE 10 MG/ML IJ SOLN
40.0000 mg | Freq: Two times a day (BID) | INTRAMUSCULAR | Status: DC
  Administered 2019-03-13 – 2019-03-14 (×3): 40 mg via INTRAVENOUS
  Filled 2019-03-13 (×3): qty 4

## 2019-03-13 MED ORDER — METOPROLOL TARTRATE 25 MG PO TABS
25.0000 mg | ORAL_TABLET | Freq: Two times a day (BID) | ORAL | Status: DC
  Administered 2019-03-13: 25 mg via ORAL
  Filled 2019-03-13: qty 1

## 2019-03-13 MED ORDER — PRAVASTATIN SODIUM 40 MG PO TABS
40.0000 mg | ORAL_TABLET | Freq: Every day | ORAL | Status: DC
  Administered 2019-03-13: 40 mg via ORAL
  Filled 2019-03-13: qty 1

## 2019-03-13 MED ORDER — POTASSIUM CHLORIDE CRYS ER 20 MEQ PO TBCR
20.0000 meq | EXTENDED_RELEASE_TABLET | Freq: Two times a day (BID) | ORAL | Status: DC
  Administered 2019-03-13 – 2019-03-14 (×2): 20 meq via ORAL
  Filled 2019-03-13 (×2): qty 1

## 2019-03-13 MED ORDER — ASPIRIN EC 81 MG PO TBEC
81.0000 mg | DELAYED_RELEASE_TABLET | Freq: Every day | ORAL | Status: DC
  Administered 2019-03-13: 81 mg via ORAL
  Filled 2019-03-13: qty 1

## 2019-03-13 MED ORDER — ZOLPIDEM TARTRATE 5 MG PO TABS: 5.0000 mg | ORAL_TABLET | Freq: Every evening | ORAL | Status: DC | PRN

## 2019-03-13 MED ORDER — SODIUM CHLORIDE 0.9 % IV SOLN: 250.0000 mL | INTRAVENOUS | Status: DC | PRN

## 2019-03-13 MED ORDER — METOPROLOL TARTRATE 25 MG PO TABS
12.5000 mg | ORAL_TABLET | Freq: Once | ORAL | Status: AC
  Administered 2019-03-13: 16:00:00 12.5 mg via ORAL
  Filled 2019-03-13: qty 1

## 2019-03-13 MED ORDER — LISINOPRIL 10 MG PO TABS
10.0000 mg | ORAL_TABLET | Freq: Every day | ORAL | Status: DC
  Administered 2019-03-13 – 2019-03-14 (×2): 10 mg via ORAL
  Filled 2019-03-13 (×2): qty 1

## 2019-03-13 NOTE — Consult Note (Addendum)
Cardiology Consultation:   Patient ID: Brian Baker MRN: [id number]; DOB: 1936/02/09  Admit date: 03/12/2019 Date of Consult: 03/13/2019  Primary Care Provider: [place], [place] [facility] Primary Cardiologist: [facility], Dr. Lynnea Ferrier Primary Electrophysiologist:  None    Patient Profile:   Brian Baker is a 83 y.o. male with a hx of permanent atrial fibrillation with RVR on anticoagulation, CVA (~ 2018 per [facility]), HTN, HLD, chronic combined systolic and diastolic congestive heart failure (EF 35-40%), moderate MR, former smoker (40pk year history, quit 1995), COPD, and R eye enucleation, GERD, and who is being seen today for the evaluation of acute on chronic HFrEF (EF 35-40%) at the request of Dr. Arville Care.  History of Present Illness:   Brian Baker is an 83 yo male with PMH as above.  He has been followed by [facility] / Dr. Lynnea Ferrier with most recent visit to cardiologist 12/25/2018.  He is reportedly a refugee from [place] and moved to [place] for 5 years before he came to the [place] 10/2016.  Per daughter in law, h/o heart attack three years before coming to [place]; however, unable to obtain confirmation of this event at this time.  He also has a known history of CVA dating back to 2018 per review of EMR.  Past surgical history was notable for right eye enucleation of unknown date but notable on physical exam.   Of note, the HPI was obtained through a combination of review of EMR, an interpreter, and family as patient does not speak [place]. Patient speaks a specific dialect of Arabic and also per son does not communicate well on his own due to past history of stroke.   He was admitted 05/24/2018-05/27/2018 with acute on chronic combined systolic and diastolic heart failure and treated with IV diuretics.  On review of notes, it was thought that he may had underlying COPD at that time, for which he was referred to pulmonology and started on home oxygen.  His diltiazem CD 180 mg daily was discontinued  during the admission d/t bradycardic rates and his previous metoprolol changed to Coreg. Echo done at that time (05/26/2018 echo) showed EF low normal at 50%, low normal LV wall motion globally, abnormal diastolic filling pattern consistent with Afib, severe LAE, mild RAE, moderate MR, mild dilation of the ascending aorta at 3.57cm, and possibly small size of IVC.   He was admitted to [facility] 01/2019 for acute on chronic heart failure. Per the patient's daughter-in-law, for 3-4 days leading up to this admission, he had been complaining of chest pain, increasing SOB/DOE, HA, light-headedness, and nausea. No reported CP on admission. The daughter did note bilateral ankle and pedal edema, however. In the ED, BP 189/139, ventricular rate 100 bpm, SpO2 87% ORA (uses home oxygen per EMR). Labs showed troponin 0.07, Hgb 12.3, K 3.6. BNP 955.0. COVID-19 negative. EKG showed known atrial fibrillation with ventricular rate 95 bpm and no acute changes. CXR showed vascular congestion with possible small L pleural effusion. Echo showed reduced EF from previous echo with EF 35-40%, moderately increased left ventricular wall thickness, and global hypokinesis with images suggesting significant inferior wall hypokinesis.  Right ventricular systolic pressure was also severely elevated with estimated pressure of 62.9 mmHg. He had severe LAE and moderate RAE with MR and TR. He was started on IV lasix  and ASA with nitropaste then admitted for further evaluation and management. Stroke protocol was then by an RN d/t concern for acute CVA given patient's inability to communicate and with  nuero workup showing no acute findings and known previous CVA. Family confirmed patient at his baseline and decided to take patient home without further recommended workup per cardiology or ICU. As a result, [facility] contacted Dr. Lynnea FerrierSolomon (primary cardiologist) with recommendations for immediate follow-up / further workup and medication changes that same  day. Per son, patient has follow-up appointment scheduled for August.   On 7/20 he was seen in the ED for UTI and started on abx. Since then, the patient has c/o recent lethargy and sob with elevated BP. On 03/12/2019, he [facility] to [facility].  On review of ED documentation / ED MD notes, dry cough and wheezing was also noted, along with weakness at presentation. Patient's son, however, denies history of cough today. No LEE reported, which the son also stated as unusual for the patient as he usual gets LEE with his volume overload. He did note reduced appetite, however. Vitals significant for BP 172/99, ventricular rate 123 bpm, 85% RA. EKG showed atrial fibrillation with ventricular rate of 105 bpm, LVH, PVC, IVCD, poor conduction / placement lead III. QRS 102 QTc 479.  CXR showed cardiomegaly with probable mild vascular congestion but overall improved aeration of the lungs since previous chest x-ray.  Labs significant for sodium 141, potassium 3.1, creatinine 1.02, BUN 21, calcium 9.0, AST 15, ALT 12, BNP 721.0, troponin flat trending 43-->45, WBC 9.1, hemoglobin 12.5.  Past Medical History:  Diagnosis Date   Arrhythmia    atrial fibrillation   CHF (congestive heart failure) (HCC)    COPD (chronic obstructive pulmonary disease) (HCC)    Enlarged prostate    Hyperlipemia    Hypertension    Stroke [facility](HCC)     History reviewed. No pertinent surgical history.   Home Medications:  Prior to Admission medications   Medication Sig Start Date End Date Taking? Authorizing Provider  albuterol (VENTOLIN HFA) 108 (90 Base) MCG/ACT inhaler Inhale 1-2 puffs into the lungs every 4 (four) hours as needed.  05/27/18 05/27/19 Yes [provider]  ELIQUIS 5 MG TABS tablet Take 2.5 mg by mouth 2 (two) times daily.  01/09/19  Yes [provider]  finasteride (PROSCAR) 5 MG tablet TK 1 T PO Q 24 H 12/02/18  Yes [provider]  furosemide (LASIX) 40 MG tablet Take 40 mg by mouth daily.   11/26/18  Yes [provider]  lisinopril (ZESTRIL) 10 MG tablet TK 1 T PO  D 12/27/18  Yes [provider]  pravastatin (PRAVACHOL) 40 MG tablet TK 1 T PO HS 01/04/19  Yes [provider]  tamsulosin (FLOMAX) 0.4 MG CAPS capsule TK ONE C PO QD 10/29/18  Yes [provider]  metoprolol tartrate (LOPRESSOR) 25 MG tablet Take 25 mg by mouth 2 (two) times daily.  12/25/18   [provider]  omeprazole (PRILOSEC) 20 MG capsule Take 20 mg by mouth daily.  12/25/18   [provider]    Inpatient Medications: Scheduled Meds:  apixaban  2.5 mg Oral BID   aspirin EC  81 mg Oral Daily   cephALEXin  500 mg Oral TID   finasteride  5 mg Oral Daily   furosemide  40 mg Intravenous Q12H   lisinopril  10 mg Oral Daily   metoprolol tartrate  25 mg Oral BID   pantoprazole  40 mg Oral Daily   potassium chloride  20 mEq Oral BID   pravastatin  40 mg Oral q1800   sodium chloride flush  3 mL Intravenous Q12H  tamsulosin  0.4 mg Oral Daily   Continuous Infusions:  sodium chloride     PRN Meds: sodium chloride, acetaminophen, albuterol, ondansetron (ZOFRAN) IV, sodium chloride flush, zolpidem  Allergies:   No Known Allergies  Social History:   Social History   Socioeconomic History   Marital status: Widowed    Spouse name: Not on file   Number of children: Not on file   Years of education: Not on file   Highest education level: Not on file  Occupational History   Not on file  Social Needs   Financial resource strain: Not on file   Food insecurity    Worry: Not on file    Inability: Not on file   Transportation needs    Medical: Not on file    Non-medical: Not on file  Tobacco Use   Smoking status: Former Smoker   Smokeless tobacco: Never Used  Substance and Sexual Activity   Alcohol use: Not Currently   Drug use: Never   Sexual activity: Not on file  Lifestyle   Physical activity    Days per week: Not on file     Minutes per session: Not on file   Stress: Not on file  Relationships   Social connections    Talks on phone: Not on file    Gets together: Not on file    Attends religious service: Not on file    Active member of club or organization: Not on file    Attends meetings of clubs or organizations: Not on file    Relationship status: Not on file   Intimate partner violence    Fear of current or ex partner: Not on file    Emotionally abused: Not on file    Physically abused: Not on file    Forced sexual activity: Not on file  Other Topics Concern   Not on file  Social History Narrative   Not on file    Family History:   History reviewed. No pertinent family history.  No known cardiac family history.  ROS:  Please see the history of present illness.  Review of Systems  Unable to perform ROS: Other  Constitutional: Positive for malaise/fatigue. Negative for chills and fever.  Respiratory: Positive for shortness of breath.        Cough d/t GERD and improving after PPI prescribed by his primary cardiologist  Cardiovascular: Negative for chest pain and leg swelling.  All other systems reviewed and are negative.   All other ROS reviewed and negative.     Physical Exam/Data:   Vitals:   03/13/19 0130 03/13/19 0200 03/13/19 0309 03/13/19 0743  BP: (!) 145/86 (!) 143/91 (!) 152/94 136/74  Pulse: 100 93 95 91  Resp: (!) 28 (!) 28 16 19   Temp:   98.2 F (36.8 C) 98.6 F (37 C)  TempSrc:   Oral Oral  SpO2: (!) 85% 95% 92% 91%  Weight:   64 kg   Height:   5' 4.96" (1.65 m)     Intake/Output Summary (Last 24 hours) at 03/13/2019 1123 Last data filed at 03/13/2019 0221 Gross per 24 hour  Intake 10 ml  Output --  Net 10 ml   Filed Weights   03/13/19 0309  Weight: 64 kg   Body mass index is 23.51 kg/m.  General:  Well nourished, well developed, in no acute distress HEENT: normal. Known R eye enucleation  Neck: Elevated JVP  ~9-10cm Vascular: Radial pulses 2+  bilaterally  Cardiac:  IRIR; 1/6 systolic murmur Lungs: Bilateral expiratory wheezing, rhonchi cleared with cough Abd: soft, nontender, no hepatomegaly  Ext: No LEE Musculoskeletal:  No deformities, BUE and BLE strength normal and equal Skin: warm and dry  Neuro:  no focal abnormalities noted that were not present at baseline Psych:  Normal affect   EKG:  The EKG was personally reviewed and demonstrates: atrial fibrillation with ventricular rate of 105 bpm, LVH, PVC, IVCD, poor conduction / placement lead III. QRS 102 QTc 479.  Telemetry:  Telemetry was personally reviewed and demonstrates: IRIR with ventricular rates 90-135  Relevant CV Studies:  TTE 01/25/2019 The left ventricle has moderately reduced systolic function, with an ejection fraction of 35-40%. The cavity size was normal. There is moderately increased left ventricular wall thickness. Left ventricular diastolic Doppler parameters are  indeterminate.Global hypokinesis, select images suggests significant inferior wall hypokinesis.  2. The right ventricle has mildly reduced systolic function. The cavity was normal. There is no increase in right ventricular wall thickness. Right ventricular systolic pressure is severely elevated with an estimated pressure of 62.9 mmHg.  3. Left atrial size was severely dilated.  4. Right atrial size was moderately dilated.  5. Mitral valve regurgitation is mild to moderate  6. Tricuspid valve regurgitation is mild-moderate.  TTE 05/2018 Summary:  1. There is slightly decreased left ventricular systolic function. --with low normal LV wall motion globally 2. The ejection fraction is estimated to be 50%. 3. Abnormal diastolic filling pattern consistent with underlying atrial fibrillation was observed. 4. The left atrium is moderate to severely dilated.  5. The right atrium is mildly dilated. 6. There is moderate mitral regurgitation observed. --jet poorly visualized, 2 jets 7. There is mild  dilatation of the ascending aorta. --at 3.57 cm 8. The inferior vena cava is not visualized. --possibly small in sizeSummary:  1. There is slightly decreased left ventricular systolic function. --with low normal LV wall motion globally 2. The ejection fraction is estimated to be 50%. 3. Abnormal diastolic filling pattern consistent with underlying atrial fibrillation was observed. 4. The left atrium is moderate to severely dilated.  5. The right atrium is mildly dilated. 6. There is moderate mitral regurgitation observed. --jet poorly visualized, 2 jets 7. There is mild dilatation of the ascending aorta. --at 3.57 cm 8. The inferior vena cava is not visualized. --possibly small in size  Echo with bubble study 12/11/2016 Summary:  1. No pulmonary hypertension is noted. 2. The inferior vena cava appears normal suggesting normal central venous pressure. 3. There is mildly decreased left ventricular systolic function. 4. Mild global hypokinesis of the left ventricle is observed. 5. The EF is estimated at 40-45%. 6. The right atrium is mildly dilated. 7. The left atrium is mild to moderately dilated.  8. There is moderate mitral regurgitation observed. 9. There is no evidence of a patent foramen ovale by color Doppler and agitated saline contrast.  10. The left ventricle mass index and the relative wall thickness values indicate concentric hypertrophy.  Laboratory Data:  Chemistry Recent Labs  Lab 03/11/19 1511 03/12/19 2239 03/13/19 0443  NA 141 142 141  K 3.3* 3.2* 3.1*  CL 99 102 101  CO2 GLUCOSE 146* 190* 176*  BUN 23 25* 21  CREATININE 0.82 0.95 1.02  CALCIUM 9.7 9.4 9.0  GFRNONAA >60 >60 >60  GFRAA >60 >60 >60  ANIONGAP Recent Labs  Lab 03/11/19 1511 03/12/19 2239 03/13/19 0443  PROT 8.1  7.9 7.1  ALBUMIN 4.9 4.7 4.2  AST 19 18 15   ALT 14 14 12   ALKPHOS 83 79 65  BILITOT 2.4* 1.8* 2.1*   Hematology Recent Labs  Lab 03/11/19 1511  03/12/19 2239 03/13/19 0443  WBC 7.3 11.5* 9.1  RBC 5.07 4.86 4.84  HGB 12.9* 12.5* 12.5*  HCT 42.2 40.4 39.6  MCV 83.2 83.1 81.8  MCH 25.4* 25.7* 25.8*  MCHC 30.6 30.9 31.6  RDW 15.3 15.1 15.1  PLT 105* 108* 109*   Cardiac Enzymes No results for input(s): TROPONINI in the last 168 hours. No results for input(s): TROPIPOC in the last 168 hours.  BNP Recent Labs  Lab 03/12/19 2239 03/13/19 0443  BNP 611.0* 721.0*    DDimer No results for input(s): DDIMER in the last 168 hours.  Radiology/Studies:  Ct Head Wo Contrast  Result Date: 03/11/2019 CLINICAL DATA:  Weakness EXAM: CT HEAD WITHOUT CONTRAST TECHNIQUE: Contiguous axial images were obtained from the base of the skull through the vertex without intravenous contrast. COMPARISON:  01/25/2019 FINDINGS: Brain: There again noted changes consistent with left MCA infarct primarily anteriorly with some cephalo malacia and mild dilatation of the left lateral ventricle. These changes are stable from the recent exam. No findings to suggest acute hemorrhage, acute infarction or space-occupying mass lesion are noted. Basal ganglia calcifications on the left are noted and stable. Diffuse atrophic changes are again identified and stable. Vascular: No hyperdense vessel or unexpected calcification. Skull: Normal. Negative for fracture or focal lesion. Sinuses/Orbits: Orbits are within normal limits. Mild mucosal thickening in the left maxillary antrum is seen slightly increased when compared with prior exam. Other: None. IMPRESSION: Chronic changes consistent with left MCA infarct. Chronic atrophic and ischemic changes. No acute abnormality is noted. Electronically Signed   By: Inez Catalina M.D.   On: 03/11/2019 17:37   Dg Chest Portable 1 View  Result Date: 03/13/2019 CLINICAL DATA:  83 year old male with shortness of breath and wheezing. History of CHF. EXAM: PORTABLE CHEST 1 VIEW COMPARISON:  Chest radiograph dated 03/11/2019 FINDINGS: Stable  cardiomegaly. Probable mild vascular congestion. Overall improved aeration of the lungs since the prior radiograph. Bibasilar atelectatic changes noted. No large pleural effusion. There is no pneumothorax. Atherosclerotic calcification of the aorta. No acute osseous pathology. IMPRESSION: Cardiomegaly with probable mild vascular congestion. Overall improved aeration of the lungs since the prior radiograph. Electronically Signed   By: Anner Crete M.D.   On: 03/13/2019 00:28   Dg Chest Port 1 View  Result Date: 03/11/2019 CLINICAL DATA:  Weakness EXAM: PORTABLE CHEST 1 VIEW COMPARISON:  01/24/2019 FINDINGS: Cardiac shadow is enlarged but stable. Aortic calcifications are again seen. Mild central vascular congestion is again noted. Some patchy infiltrative changes are noted in the bases bilaterally but relatively stable from the prior study. Mild interstitial edema is seen. IMPRESSION: Patchy bibasilar infiltrate/atelectasis. Mild interstitial edema. Electronically Signed   By: Inez Catalina M.D.   On: 03/11/2019 17:52    Assessment and Plan:   Acute on chronic HFrEF, pulmonary HTN -Progressive shortness of breath / DOE and fatigue. BNP elevated at 721.0. 6/5 echo shows new and moderately reduced systolic function with EF 35 to 40%, global hypokinesis, RVSP 62.91mmHg. As previously noted, further ischemic workup recommended d/t decreased EF with patient primary cardiologist at [facility].  -Reasonable to continue with gentle diuresis with IV lasix 40mg  q12h for now and titrate as renal function allows. Monitor renal function closely given overnight Cr 0.95  1.02 with improvement  in BUN. Wt at last outpatient visit 135lbs (5/2) with current Wt 141lbs. Monitor standing weights daily, as well as I's/O's with current EMR stating +10cc.   -Monitor closely for signs and symptoms of infection given elevated WBC on 7/21 and findings on CXR with c/o cough at presentation. Lactic acid 1.7 at admission. -Continue  increased dose of metoprolol 37.5 mg twice daily given elevated BP and titrate as needed for further BP support. Given reduced EF and hypokalemia, recommend spironolactone. Consider further increasing dose of BB and/or ACE if BP remains suboptimally controlled. Will need follow-up with primary cardiologist with existing appt scheduled in August.   UTI - Continue abx with UA showing Hgb and leukocytes - Close monitoring of renal function given diuresis above with history of recent UTI   Troponin elevation without chest pain ICM (EF 35-40%) -No chest pain. Troponin mildly elevated, flat trending at 41  43  45. EKG without acute changes. - No current plan for cardiac catheterization this admission. As previously noted, further outpatient ischemic work-up recommended with cardiac catheterization given decreased EF and hypokinesis as above during 01/2019 admission.   -For now, continue medical management as directly above.  Aggressive risk factor modification recommended, including statin therapy.  MR/TR - Recommend continue to monitor with periodic echo  Hypokalemia - K 3.1. Replete with goal 4.0 as above. Check Mg - Repeat BMET following repletion to ensure at goal - Patient did not start recommended home spironolactone due to leaving AMA last admission.  Recommend spironolactone going forward as patient already has PTA KCl supplementation.  HTN -SBP 170s at presentation. Improved but still sub-optimally controlled this morning. Increased BB as above with recommendations for further titration as HR allows to control BP and/or increased ACE dose. Recommend spironolactone.  Continue lasix.  HLD -Continue statin therapy  -Goal LDL below 70.  Permanent Afib with RVR - Remains on anticoagulation. Unable to assess if symptomatic.   - CHA2DS2VASc score of at least 7 (CHF, HTN, agex2, strokex2, vascular). Recommend continue anticoagulation. - Increased dose to Eliquis 5mg  BID given patient does not  meet dose reduction criteria (2 of 3 criteria). If weight drops below 60kg or Cr increases above 1.5, recommend reduced dose. For now, continue at 5mg  BID. - Consider obtaining A1C lab as well for further risk stratification and as cannot find an A1C in records. TSH 7/22 of 2.136. -Continue rate control with metoprolol as above and titrate as needed for optimal rate control. No current plan for cardioversion.   Anemia - Daily CBC - Recommend close monitoring of hemoglobin on anticoagulation. Consider workup for acute bleed if any sudden drops and recommend transfusion below 8.  Per IM.   For questions or updates, please contact [facility] Please consult [email or web address] for contact info under     Signed, Lennon AlstromJacquelyn D Farhiya Rosten, PA-C  03/13/2019 11:23 AM

## 2019-03-13 NOTE — ED Notes (Signed)
Admitting Provider at bedside. 

## 2019-03-13 NOTE — Progress Notes (Signed)
   Updated patient's son in person from a cardiac standpoint. Confirmed and reviewed patient's cardiac history, as well as his symptoms leading up to his admission.  Obtained further clarification regarding communication with patient. 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 At his previous [facility] admission, patient declined using a translator for this reason.  Per son, he estimates he will be able to visit his father again tomorrow 7/23 at ~10am-11am for any providers that wish to speak with him. He also stated he lives close to [facility] and is available by phone if needed before that time:  Patient's son, Festus Aloe: [phone]   Signed, Arvil Chaco, PA-C 03/13/2019, 3:33 PM Pager [phone]

## 2019-03-13 NOTE — H&P (Addendum)
[employer] - [place] at [facility]   PATIENT NAME: Brian Baker    MR#:  [id number]  DATE OF BIRTH:  03/02/1936  DATE OF ADMISSION:  03/12/2019  PRIMARY CARE PHYSICIAN: [facility]   REQUESTING/REFERRING PHYSICIAN: Loleta RoseForbach, Cory, MD  CHIEF COMPLAINT:   Chief Complaint  Patient presents with  . Weakness  Shortness of breath History was obtained through Arabic speaking with the patient and Arabic and English speaking with his daughter HISTORY OF PRESENT ILLNESS:  Brian Baker  is a 83 y.o. [place] male with a known history of multiple medical problems that are mentioned below including CHF and COPD, hypertension and dyslipidemia as well as CVA with residual dysarthria, who presented to the emergency room with acute onset of worsening dyspnea since yesterday with associated cough that has been mainly dry and wheezing.  He is felt very weak today.  He was seen in the ER and was diagnosed with UTI yesterday.  He was given p.o. Keflex that he continues to take.  He denied otherwise any sick exposure or exposure to COVID-19.  He has been having urinary frequency but denies any current dysuria.  No abdominal pain or melena or bright red interacting.  Denies any worsening lower extremity edema or significant orthopnea or paroxysmal nocturnal dyspnea.  Upon presentation to the emergency room his blood pressure was 162/121 with a pulse of 104 respiratory to 27 with pulse oximetry 96% on room air.  Labs revealed hypokalemia 3.2 with an elevated BNP of 611 and high-sensitivity troponin I of 41.  CBC showed minimal leukocytosis 11.5 with hemoglobin of 12.4 hematocrit of 40.4 and platelets 108.  Portable chest x-ray revealed patchy bibasilar infiltrate and mild anesthesia pulmonary edema.  The patient was given 40 mg of IV Lasix and 1 inch of Nitropaste.  He will be admitted to a telemetry bed for further evaluation and management. PAST MEDICAL HISTORY:   Past Medical History:   Diagnosis Date  . Arrhythmia    atrial fibrillation  . CHF (congestive heart failure) (HCC)   . COPD (chronic obstructive pulmonary disease) (HCC)   . Enlarged prostate   . Hyperlipemia   . Hypertension   . Stroke [place](HCC)     PAST SURGICAL HISTORY:  History reviewed. No pertinent surgical history.  He denies any previous surgeries  SOCIAL HISTORY:   Social History   Tobacco Use  . Smoking status: Former Games developermoker  . Smokeless tobacco: Never Used  Substance Use Topics  . Alcohol use: Not Currently    FAMILY HISTORY:  No family history on file.  He denied any familial diseases  DRUG ALLERGIES:  No Known Allergies  REVIEW OF SYSTEMS:   ROS As per history of present illness. All pertinent systems were reviewed above. Constitutional,  HEENT, cardiovascular, respiratory, GI, GU, musculoskeletal, neuro, psychiatric, endocrine,  integumentary and hematologic systems were reviewed and are otherwise  negative/unremarkable except for positive findings mentioned above in the HPI.   MEDICATIONS AT HOME:   Prior to Admission medications   Medication Sig Start Date End Date Taking? Authorizing Provider  albuterol (VENTOLIN HFA) 108 (90 Base) MCG/ACT inhaler Inhale 1-2 puffs into the lungs every 4 (four) hours as needed.  05/27/18 05/27/19 Yes [provider]  cephALEXin (KEFLEX) 500 MG capsule Take 1 capsule (500 mg total) by mouth 3 (three) times daily for 7 days. 03/11/19 03/18/19 Yes McShane, Rudy JewJames A, MD  ELIQUIS 5 MG TABS tablet Take 2.5 mg by mouth 2 (two)  times daily.  01/09/19  Yes [provider]  finasteride (PROSCAR) 5 MG tablet TK 1 T PO Q 24 H 12/02/18  Yes [provider]  furosemide (LASIX) 40 MG tablet Take 40 mg by mouth daily.  11/26/18  Yes [provider]  lisinopril (ZESTRIL) 10 MG tablet TK 1 T PO  D 12/27/18  Yes [provider]  metoprolol tartrate (LOPRESSOR) 25 MG tablet Take 25 mg by mouth 2 (two) times daily.  12/25/18  Yes  [provider]  omeprazole (PRILOSEC) 20 MG capsule Take 20 mg by mouth daily.  12/25/18  Yes [provider]  pravastatin (PRAVACHOL) 40 MG tablet TK 1 T PO HS 01/04/19  Yes [provider]  tamsulosin (FLOMAX) 0.4 MG CAPS capsule TK ONE C PO QD 10/29/18  Yes [provider]      VITAL SIGNS:  Blood pressure (!) 143/91, pulse 93, temperature 99.3 F (37.4 C), temperature source Oral, resp. rate (!) 28, SpO2 95 %.  PHYSICAL EXAMINATION:  Physical Exam  GENERAL:  83 y.o.-year-old [place] male patient lying in the bed inmild respiratory distress with conversational dyspnea EYES: Pupils equal, round, reactive to light and accommodation. No scleral icterus. Extraocular muscles intact.  HEENT: Head atraumatic, normocephalic. Oropharynx and nasopharynx clear.  NECK:  Supple, no jugular venous distention. No thyroid enlargement, no tenderness.  LUNGS: Diminished bibasilar breath sounds with bibasilar and midlung zone rales. CARDIOVASCULAR: Regular rate and rhythm, S1, S2 normal. No murmurs, rubs, or gallops.  ABDOMEN: Soft, nondistended, nontender. Bowel sounds present. No organomegaly or mass.  EXTREMITIES: No pedal edema, cyanosis, or clubbing.  NEUROLOGIC: Cranial nerves II through XII are intact. Muscle strength 5/5 in all extremities. Sensation intact. Gait not checked.  PSYCHIATRIC: The patient is alert and oriented x 3.  Normal affect and good eye contact. SKIN: No obvious rash, lesion, or ulcer.   LABORATORY PANEL:   CBC Recent Labs  Lab 03/12/19 2239  WBC 11.5*  HGB 12.5*  HCT 40.4  PLT 108*   ------------------------------------------------------------------------------------------------------------------  Chemistries  Recent Labs  Lab 03/12/19 2239  NA 142  K 3.2*  CL 102  CO2 29  GLUCOSE 190*  BUN 25*  CREATININE 0.95  CALCIUM 9.4  AST 18  ALT 14  ALKPHOS 79  BILITOT 1.8*    ------------------------------------------------------------------------------------------------------------------  Cardiac Enzymes No results for input(s): TROPONINI in the last 168 hours. ------------------------------------------------------------------------------------------------------------------  RADIOLOGY:  Ct Head Wo Contrast  Result Date: 03/11/2019 CLINICAL DATA:  Weakness EXAM: CT HEAD WITHOUT CONTRAST TECHNIQUE: Contiguous axial images were obtained from the base of the skull through the vertex without intravenous contrast. COMPARISON:  01/25/2019 FINDINGS: Brain: There again noted changes consistent with left MCA infarct primarily anteriorly with some cephalo malacia and mild dilatation of the left lateral ventricle. These changes are stable from the recent exam. No findings to suggest acute hemorrhage, acute infarction or space-occupying mass lesion are noted. Basal ganglia calcifications on the left are noted and stable. Diffuse atrophic changes are again identified and stable. Vascular: No hyperdense vessel or unexpected calcification. Skull: Normal. Negative for fracture or focal lesion. Sinuses/Orbits: Orbits are within normal limits. Mild mucosal thickening in the left maxillary antrum is seen slightly increased when compared with prior exam. Other: None. IMPRESSION: Chronic changes consistent with left MCA infarct. Chronic atrophic and ischemic changes. No acute abnormality is noted. Electronically Signed   By: Inez Catalina M.D.   On: 03/11/2019 17:37   Dg Chest Portable 1 View  Result  Date: 03/13/2019 CLINICAL DATA:  83 year old male with shortness of breath and wheezing. History of CHF. EXAM: PORTABLE CHEST 1 VIEW COMPARISON:  Chest radiograph dated 03/11/2019 FINDINGS: Stable cardiomegaly. Probable mild vascular congestion. Overall improved aeration of the lungs since the prior radiograph. Bibasilar atelectatic changes noted. No large pleural effusion. There is no pneumothorax.  Atherosclerotic calcification of the aorta. No acute osseous pathology. IMPRESSION: Cardiomegaly with probable mild vascular congestion. Overall improved aeration of the lungs since the prior radiograph. Electronically Signed   By: Elgie CollardArash  Radparvar M.D.   On: 03/13/2019 00:28   Dg Chest Port 1 View  Result Date: 03/11/2019 CLINICAL DATA:  Weakness EXAM: PORTABLE CHEST 1 VIEW COMPARISON:  01/24/2019 FINDINGS: Cardiac shadow is enlarged but stable. Aortic calcifications are again seen. Mild central vascular congestion is again noted. Some patchy infiltrative changes are noted in the bases bilaterally but relatively stable from the prior study. Mild interstitial edema is seen. IMPRESSION: Patchy bibasilar infiltrate/atelectasis. Mild interstitial edema. Electronically Signed   By: Alcide CleverMark  Lukens M.D.   On: 03/11/2019 17:52      IMPRESSION AND PLAN:   1.  Acute on chronic systolic and diastolic CHF with subsequent acute on chronic hypoxic respiratory failure.  The patient will be admitted to a telemetry bed and will be diuresed with IV Lasix.  Will follow serial cardiac enzymes.  Will obtain a 2D echo(if not done last 6 months) and a cardiology consultation in a.m. by [facility].  Dr. Okey DupreEnd was notified about the patient.  O2 protocol will be followed  2.  Hypertensive urgency.  This likely the culprit for #1.  The patient will be placed on PRN IV hydralazine and labetalol we will continue his antihypertensives.  We will continue his lisinopril and Lopressor.  3.  Hypokalemia.  Potassium will be replaced and magnesium level will be checked.  4.  Recent UTI with gram-negative rods.  We will continue his p.o. Keflex and follow urine culture and sensitivity   5.  Dyslipidemia.  Statin therapy will be resumed.  6.  BPH.  Flomax will be resumed as well as Proscar.  7.  CVA with residual dysarthria.  Aspirin and statin therapy will be resumed.  8.  DVT prophylaxis.  This will be provided with continued  Eliquis.     All the records are reviewed and case discussed with ED provider. The plan of care was discussed in details with the patient (and family). I answered all questions. The patient agreed to proceed with the above mentioned plan. Further management will depend upon hospital course.   CODE STATUS: Full code  TOTAL TIME TAKING CARE OF THIS PATIENT: 50 minutes.    Hannah BeatJan A  M.D on 03/13/2019 at 2:53 AM  Pager - [phone]  After 6pm go to [email or web address] - Social research officer, governmentpassword [email or web address]  [facility]  Office  [phone]  CC: Primary care physician; [facility]   Note: This dictation was prepared with Dragon dictation along with smaller phrase technology. Any transcriptional errors that result from this process are unintentional.

## 2019-03-13 NOTE — ED Notes (Signed)
Daughter in law Reem phone number [phone]

## 2019-03-13 NOTE — Progress Notes (Signed)
Patient ID: Brian Baker, male   DOB: 12/18/1935, 83 y.o.   MRN: [id number]  [employer] PROGRESS NOTE  Brian Oxfordhmad Joerger ATF:[id number] DOB: 04/15/1936 DOA: 03/12/2019 [facility]: [facility]  HPI/Subjective: Patient feeling better and wanted to go home.  Still has shortness of breath.  Up on 4 L of oxygen and normally wears 2.  The Arabic interpreter on the stick was not the same dialect as the patient.  Had to use the patient's son as Nurse, learning disabilitytranslator.  Objective: Vitals:   03/13/19 0743 03/13/19 1554  BP: 136/74 137/78  Pulse: 91 93  Resp: 19 19  Temp: 98.6 F (37 C) 98 F (36.7 C)  SpO2: 91% 90%    Filed Weights   03/13/19 0309  Weight: 64 kg    ROS: Review of Systems  Constitutional: Negative for chills and fever.  Eyes: Negative for blurred vision.  Respiratory: Positive for shortness of breath. Negative for cough.   Cardiovascular: Negative for chest pain.  Gastrointestinal: Negative for abdominal pain, constipation, diarrhea, nausea and vomiting.  Genitourinary: Negative for dysuria.  Musculoskeletal: Negative for joint pain.  Neurological: Negative for dizziness and headaches.   Exam: Physical Exam  Constitutional: He is oriented to person, place, and time.  HENT:  Nose: No mucosal edema.  Mouth/Throat: No oropharyngeal exudate or posterior oropharyngeal edema.  Eyes: Pupils are equal, round, and reactive to light. Conjunctivae, EOM and lids are normal.  Neck: No JVD present. Carotid bruit is not present. No edema present. No thyroid mass and no thyromegaly present.  Cardiovascular: S1 normal and S2 normal. Exam reveals no gallop.  No murmur heard. Pulses:      Dorsalis pedis pulses are 2+ on the right side and 2+ on the left side.  Respiratory: No respiratory distress. He has decreased breath sounds in the right lower field and the left lower field. He has no wheezes. He has no rhonchi. He has rales in the right lower field and the left lower field.  GI: Soft. Bowel  sounds are normal. There is no abdominal tenderness.  Musculoskeletal:     Right ankle: He exhibits no swelling.     Left ankle: He exhibits no swelling.  Lymphadenopathy:    He has no cervical adenopathy.  Neurological: He is alert and oriented to person, place, and time. No cranial nerve deficit.  Skin: Skin is warm. No rash noted. Nails show no clubbing.  Psychiatric: He has a normal mood and affect.      Data Reviewed: Basic Metabolic Panel: Recent Labs  Lab 03/11/19 1511 03/12/19 2239 03/13/19 0443  NA 141 142 141  K 3.3* 3.2* 3.1*  CL 99 102 101  CO2 30 29 30   GLUCOSE 146* 190* 176*  BUN 23 25* 21  CREATININE 0.82 0.95 1.02  CALCIUM 9.7 9.4 9.0  MG  --   --  1.8   Liver Function Tests: Recent Labs  Lab 03/11/19 1511 03/12/19 2239 03/13/19 0443  AST 19 18 15   ALT 14 14 12   ALKPHOS 83 79 65  BILITOT 2.4* 1.8* 2.1*  PROT 8.1 7.9 7.1  ALBUMIN 4.9 4.7 4.2   CBC: Recent Labs  Lab 03/11/19 1511 03/12/19 2239 03/13/19 0443  WBC 7.3 11.5* 9.1  NEUTROABS 5.1 10.0* 7.5  HGB 12.9* 12.5* 12.5*  HCT 42.2 40.4 39.6  MCV 83.2 83.1 81.8  PLT 105* 108* 109*   BNP (last 3 results) Recent Labs    01/24/19 2150 03/12/19 2239 03/13/19 0443  BNP  955.0* 611.0* 721.0*      Recent Results (from the past 240 hour(s))  Urine culture     Status: Abnormal   Collection Time: 03/11/19  5:01 PM   Specimen: Urine, Random  Result Value Ref Range Status   Specimen Description   Final    URINE, RANDOM Performed at [facility], [address] [place], [place] [address]    Special Requests   Final    NONE Performed at [facility], [address] [place], [place] [address]    Culture >=100,000 COLONIES/mL ESCHERICHIA COLI (A)  Final   Report Status 03/13/2019 FINAL  Final   Organism ID, Bacteria ESCHERICHIA COLI (A)  Final      Susceptibility   Escherichia coli - MIC*    AMPICILLIN 4 SENSITIVE Sensitive     CEFAZOLIN <=4 SENSITIVE Sensitive      CEFTRIAXONE <=1 SENSITIVE Sensitive     CIPROFLOXACIN <=0.25 SENSITIVE Sensitive     GENTAMICIN <=1 SENSITIVE Sensitive     IMIPENEM <=0.25 SENSITIVE Sensitive     NITROFURANTOIN <=16 SENSITIVE Sensitive     TRIMETH/SULFA <=20 SENSITIVE Sensitive     AMPICILLIN/SULBACTAM <=2 SENSITIVE Sensitive     PIP/TAZO <=4 SENSITIVE Sensitive     Extended ESBL NEGATIVE Sensitive     * >=100,000 COLONIES/mL ESCHERICHIA COLI  SARS Coronavirus 2 (CEPHEID - Performed in [facility] lab), Hosp Order     Status: None   Collection Time: 03/11/19  5:52 PM   Specimen: Nasopharyngeal Swab  Result Value Ref Range Status   SARS Coronavirus 2 NEGATIVE NEGATIVE Final    Comment: (NOTE) If result is NEGATIVE SARS-CoV-2 target nucleic acids are NOT DETECTED. The SARS-CoV-2 RNA is generally detectable in upper and lower  respiratory specimens during the acute phase of infection. The lowest  concentration of SARS-CoV-2 viral copies this assay can detect is 250  copies / mL. A negative result does not preclude SARS-CoV-2 infection  and should not be used as the sole basis for treatment or other  patient management decisions.  A negative result may occur with  improper specimen collection / handling, submission of specimen other  than nasopharyngeal swab, presence of viral mutation(s) within the  areas targeted by this assay, and inadequate number of viral copies  (<250 copies / mL). A negative result must be combined with clinical  observations, patient history, and epidemiological information. If result is POSITIVE SARS-CoV-2 target nucleic acids are DETECTED. The SARS-CoV-2 RNA is generally detectable in upper and lower  respiratory specimens dur ing the acute phase of infection.  Positive  results are indicative of active infection with SARS-CoV-2.  Clinical  correlation with patient history and other diagnostic information is  necessary to determine patient infection status.  Positive results  do  not rule out bacterial infection or co-infection with other viruses. If result is PRESUMPTIVE POSTIVE SARS-CoV-2 nucleic acids MAY BE PRESENT.   A presumptive positive result was obtained on the submitted specimen  and confirmed on repeat testing.  While 2019 novel coronavirus  (SARS-CoV-2) nucleic acids may be present in the submitted sample  additional confirmatory testing may be necessary for epidemiological  and / or clinical management purposes  to differentiate between  SARS-CoV-2 and other Sarbecovirus currently known to infect humans.  If clinically indicated additional testing with an alternate test  methodology [phone] is advised. The SARS-CoV-2 RNA is generally  detectable in upper and lower respiratory sp ecimens during the acute  phase of  infection. The expected result is Negative. Fact Sheet for Patients:  BoilerBrush.com.cyhttps://www.fda.gov/media/136312/download Fact Sheet for Healthcare Providers: https://pope.com/https://www.fda.gov/media/136313/download This test is not yet approved or cleared by the [place] FDA and has been authorized for detection and/or diagnosis of SARS-CoV-2 by FDA under an Emergency Use Authorization (EUA).  This EUA will remain in effect (meaning this test can be used) for the duration of the COVID-19 declaration under Section 564(b)(1) of the Act, 21 U.S.C. section 360bbb-3(b)(1), unless the authorization is terminated or revoked sooner. Performed at [facility], [address] [place], [place] [address]   Blood culture (routine x 2)     Status: None (Preliminary result)   Collection Time: 03/12/19 10:39 PM   Specimen: BLOOD  Result Value Ref Range Status   Specimen Description BLOOD LEFT FATTY CASTS  Final   Special Requests   Final    BOTTLES DRAWN AEROBIC AND ANAEROBIC Blood Culture adequate volume   Culture   Final    NO GROWTH < 12 HOURS Performed at [facility], [address] [place], [place] [address]    Report Status  PENDING  Incomplete  Blood culture (routine x 2)     Status: None (Preliminary result)   Collection Time: 03/12/19 10:39 PM   Specimen: BLOOD  Result Value Ref Range Status   Specimen Description BLOOD RIGHT WRIST  Final   Special Requests   Final    BOTTLES DRAWN AEROBIC AND ANAEROBIC Blood Culture results may not be optimal due to an excessive volume of blood received in culture bottles   Culture   Final    NO GROWTH < 12 HOURS Performed at [facility], [address] [place], [place] [address]    Report Status PENDING  Incomplete     Studies: Ct Head Wo Contrast  Result Date: 03/11/2019 CLINICAL DATA:  Weakness EXAM: CT HEAD WITHOUT CONTRAST TECHNIQUE: Contiguous axial images were obtained from the base of the skull through the vertex without intravenous contrast. COMPARISON:  01/25/2019 FINDINGS: Brain: There again noted changes consistent with left MCA infarct primarily anteriorly with some cephalo malacia and mild dilatation of the left lateral ventricle. These changes are stable from the recent exam. No findings to suggest acute hemorrhage, acute infarction or space-occupying mass lesion are noted. Basal ganglia calcifications on the left are noted and stable. Diffuse atrophic changes are again identified and stable. Vascular: No hyperdense vessel or unexpected calcification. Skull: Normal. Negative for fracture or focal lesion. Sinuses/Orbits: Orbits are within normal limits. Mild mucosal thickening in the left maxillary antrum is seen slightly increased when compared with prior exam. Other: None. IMPRESSION: Chronic changes consistent with left MCA infarct. Chronic atrophic and ischemic changes. No acute abnormality is noted. Electronically Signed   By: Alcide CleverMark  Lukens M.D.   On: 03/11/2019 17:37   Dg Chest Portable 1 View  Result Date: 03/13/2019 CLINICAL DATA:  83 year old male with shortness of breath and wheezing. History of CHF. EXAM: PORTABLE CHEST 1 VIEW COMPARISON:   Chest radiograph dated 03/11/2019 FINDINGS: Stable cardiomegaly. Probable mild vascular congestion. Overall improved aeration of the lungs since the prior radiograph. Bibasilar atelectatic changes noted. No large pleural effusion. There is no pneumothorax. Atherosclerotic calcification of the aorta. No acute osseous pathology. IMPRESSION: Cardiomegaly with probable mild vascular congestion. Overall improved aeration of the lungs since the prior radiograph. Electronically Signed   By: Elgie CollardArash  Radparvar M.D.   On: 03/13/2019 00:28   Dg Chest Port 1 View  Result Date: 03/11/2019 CLINICAL  DATA:  Weakness EXAM: PORTABLE CHEST 1 VIEW COMPARISON:  01/24/2019 FINDINGS: Cardiac shadow is enlarged but stable. Aortic calcifications are again seen. Mild central vascular congestion is again noted. Some patchy infiltrative changes are noted in the bases bilaterally but relatively stable from the prior study. Mild interstitial edema is seen. IMPRESSION: Patchy bibasilar infiltrate/atelectasis. Mild interstitial edema. Electronically Signed   By: Inez Catalina M.D.   On: 03/11/2019 17:52    Scheduled Meds: . apixaban  5 mg Oral BID  . aspirin EC  81 mg Oral Daily  . cephALEXin  500 mg Oral TID  . finasteride  5 mg Oral Daily  . furosemide  40 mg Intravenous Q12H  . lisinopril  10 mg Oral Daily  . metoprolol tartrate  12.5 mg Oral Once   Followed by  . metoprolol tartrate  37.5 mg Oral BID  . pantoprazole  40 mg Oral Daily  . potassium chloride  20 mEq Oral BID  . pravastatin  40 mg Oral q1800  . sodium chloride flush  3 mL Intravenous Q12H  . tamsulosin  0.4 mg Oral Daily   Continuous Infusions: . sodium chloride      Assessment/Plan:  1. Acute on chronic systolic congestive heart failure with EF 35 to 40%.  Continue IV Lasix twice daily metoprolol and lisinopril if blood pressure allows will add Spironolactone. 2. Atrial fibrillation on metoprolol and Eliquis 3. Acute cystitis with E. coli on  cephalexin 4. Hypertension on lisinopril and metoprolol 5. History of stroke on aspirin and Eliquis 6. Hyperlipidemia unspecified on pravastatin 7. BPH on Flomax 8. Weakness get physical therapy evaluation  Code Status:     Code Status Orders  (From admission, onward)         Start     Ordered   03/13/19 0134  Full code  Continuous     03/13/19 0133        Code Status History    Date Active Date Inactive Code Status Order ID Comments User Context   01/24/2019 2354 01/25/2019 1904 Full Code [id number]  Mansy, Arvella Merles, MD ED   Advance Care Planning Activity     Family Communication: Son at the bedside Disposition Plan: To be determined  Consultants:  Cardiology  Antibiotics:  Keflex  Time spent: 35 minutes  [place]

## 2019-03-13 NOTE — ED Notes (Signed)
ED TO INPATIENT HANDOFF REPORT  ED Nurse Name and Phone #: Cala BradfordKimberly [phone]  S Name/Age/Gender Brian Baker 83 y.o. male Room/Bed: ED19A/ED19A  Code Status   Code Status: Full Code  Home/SNF/Other Home Patient oriented to: self Is this baseline? Yes   Triage Complete: Triage complete  Chief Complaint weakness  Triage Note Pt to triage via w/c, mask in place; seen yesterday and dx with UTI; pt accomp by daughter who reports pt with cough, weakness, not feeling well; SHOB   Allergies No Known Allergies  Level of Care/Admitting Diagnosis ED Disposition    ED Disposition Condition Comment   Admit  Hospital Area: [facility] [[id number]]  Level of Care: Telemetry [5]  Covid Evaluation: Confirmed COVID Negative  Diagnosis: Acute CHF (congestive heart failure) [facility](HCC) [[id number]]  Admitting Physician: Hannah BeatMANSY, JAN A [[id number]]  Attending Physician: Hannah BeatMANSY, JAN A [[id number]]  Estimated length of stay: past midnight tomorrow  Certification:: I certify this patient will need inpatient services for at least 2 midnights  PT Class (Do Not Modify): Inpatient [101]  PT Acc Code (Do Not Modify): Private [1]       B Medical/Surgery History Past Medical History:  Diagnosis Date  . Arrhythmia    atrial fibrillation  . CHF (congestive heart failure) (HCC)   . COPD (chronic obstructive pulmonary disease) (HCC)   . Enlarged prostate   . Hyperlipemia   . Hypertension   . Stroke [facility](HCC)    History reviewed. No pertinent surgical history.   A IV Location/Drains/Wounds Patient Lines/Drains/Airways Status   Active Line/Drains/Airways    None          Intake/Output Last 24 hours No intake or output data in the 24 hours ending 03/13/19 0220  Labs/Imaging Results for orders placed or performed during the hospital encounter of 03/12/19 (from the past 48 hour(s))  Lactic acid, plasma     Status: None   Collection Time: 03/12/19 10:39 PM  Result Value Ref Range    Lactic Acid, Venous 1.7 0.5 - 1.9 mmol/L    Comment: Performed at [facility], [address] [place], [place] [address]  CBC with Differential     Status: Abnormal   Collection Time: 03/12/19 10:39 PM  Result Value Ref Range   WBC 11.5 (H) 4.0 - 10.5 K/uL   RBC 4.86 4.22 - 5.81 MIL/uL   Hemoglobin 12.5 (L) 13.0 - 17.0 g/dL   HCT 84.640.4 96.239.0 - 95.252.0 %   MCV 83.1 80.0 - 100.0 fL   MCH 25.7 (L) 26.0 - 34.0 pg   MCHC 30.9 30.0 - 36.0 g/dL   RDW 84.115.1 32.411.5 - 40.115.5 %   Platelets 108 (L) 150 - 400 K/uL    Comment: Immature Platelet Fraction may be clinically indicated, consider ordering this additional test [id number]    nRBC 0.0 0.0 - 0.2 %   Neutrophils Relative % 87 %   Neutro Abs 10.0 (H) 1.7 - 7.7 K/uL   Lymphocytes Relative 5 %   Lymphs Abs 0.6 (L) 0.7 - 4.0 K/uL   Monocytes Relative 6 %   Monocytes Absolute 0.7 0.1 - 1.0 K/uL   Eosinophils Relative 1 %   Eosinophils Absolute 0.1 0.0 - 0.5 K/uL   Basophils Relative 0 %   Basophils Absolute 0.0 0.0 - 0.1 K/uL   Immature Granulocytes 1 %   Abs Immature Granulocytes 0.08 (H) 0.00 - 0.07 K/uL    Comment: Performed at [facility], [address] [place], [place]  [address]  Comprehensive metabolic panel     Status: Abnormal   Collection Time: 03/12/19 10:39 PM  Result Value Ref Range   Sodium 142 135 - 145 mmol/L   Potassium 3.2 (L) 3.5 - 5.1 mmol/L   Chloride 102 98 - 111 mmol/L   CO2 29 22 - 32 mmol/L   Glucose, Bld 190 (H) 70 - 99 mg/dL   BUN 25 (H) 8 - 23 mg/dL   16[address]Creatinine, Ser 6.040.95 0.61 - 1.24 mg/dL   Calcium 9.4 8.9 - 54.010.3 mg/dL   Total Protein 7.9 6.5 - 8.1 g/dL   Albumin 4.7 3.5 - 5.0 g/dL   AST 18 15 - 41 U/L   ALT 14 0 - 44 U/L   Alkaline Phosphatase 79 38 - 126 U/L   Total Bilirubin 1.8 (H) 0.3 - 1.2 mg/dL   GFR calc non Af Amer >60 >60 mL/min   GFR calc Af Amer >60 >60 mL/min   Anion gap 11 5 - 15    Comment: Performed at [facility], [address] [place], [place] [address]   Troponin I (High Sensitivity)     Status: Abnormal   Collection Time: 03/12/19 10:39 PM  Result Value Ref Range   Troponin I (High Sensitivity) 41 (H) <18 ng/L    Comment: (NOTE) Elevated high sensitivity troponin I (hsTnI) values and significant  changes across serial measurements may suggest ACS but many other  chronic and acute conditions are known to elevate hsTnI results.  Refer to the "Links" section for chest pain algorithms and additional  guidance. Performed at [facility], [address] [place], [place] [address]   Brain natriuretic peptide     Status: Abnormal   Collection Time: 03/12/19 10:39 PM  Result Value Ref Range   B Natriuretic Peptide 611.0 (H) 0.0 - 100.0 pg/mL    Comment: Performed at [facility], [address] [place], [place] [address]  Urinalysis, Routine w reflex microscopic     Status: Abnormal   Collection Time: 03/12/19 10:39 PM  Result Value Ref Range   Color, Urine YELLOW (A) YELLOW   APPearance CLEAR (A) CLEAR   Specific Gravity, Urine 1.010 1.005 - 1.030   pH 7.0 5.0 - 8.0   Glucose, UA NEGATIVE NEGATIVE mg/dL   Hgb urine dipstick SMALL (A) NEGATIVE   Bilirubin Urine NEGATIVE NEGATIVE   Ketones, ur NEGATIVE NEGATIVE mg/dL   Protein, ur 30 (A) NEGATIVE mg/dL   Nitrite NEGATIVE NEGATIVE   Leukocytes,Ua LARGE (A) NEGATIVE   RBC / HPF 0-5 0 - 5 RBC/hpf   WBC, UA >50 (H) 0 - 5 WBC/hpf   Bacteria, UA NONE SEEN NONE SEEN   Squamous Epithelial / LPF 0-5 0 - 5    Comment: Performed at [facility], [address] [place], [place] [address]  Procalcitonin     Status: None   Collection Time: 03/12/19 10:39 PM  Result Value Ref Range   Procalcitonin <0.10 ng/mL    Comment:        Interpretation: PCT (Procalcitonin) <= 0.5 ng/mL: Systemic infection (sepsis) is not likely. Local bacterial infection is possible. (NOTE)       Sepsis PCT Algorithm           Lower Respiratory Tract  Infection PCT Algorithm    ----------------------------     ----------------------------         PCT < 0.25 ng/mL                PCT < 0.10 ng/mL         Strongly encourage             Strongly discourage   discontinuation of antibiotics    initiation of antibiotics    ----------------------------     -----------------------------       PCT 0.25 - 0.50 ng/mL            PCT 0.10 - 0.25 ng/mL               OR       >80% decrease in PCT            Discourage initiation of                                            antibiotics      Encourage discontinuation           of antibiotics    ----------------------------     -----------------------------         PCT >= 0.50 ng/mL              PCT 0.26 - 0.50 ng/mL               AND        <80% decrease in PCT             Encourage initiation of                                             antibiotics       Encourage continuation           of antibiotics    ----------------------------     -----------------------------        PCT >= 0.50 ng/mL                  PCT > 0.50 ng/mL               AND         increase in PCT                  Strongly encourage                                      initiation of antibiotics    Strongly encourage escalation           of antibiotics                                     -----------------------------                                           PCT <= 0.25 ng/mL  OR                                        > 80% decrease in PCT                                     Discontinue / Do not initiate                                             antibiotics Performed at [facility], [place], [place] [address]    Ct Head Wo Contrast  Result Date: 03/11/2019 CLINICAL DATA:  Weakness EXAM: CT HEAD WITHOUT CONTRAST TECHNIQUE: Contiguous axial images were obtained from the base of the skull through the vertex without intravenous contrast. COMPARISON:   01/25/2019 FINDINGS: Brain: There again noted changes consistent with left MCA infarct primarily anteriorly with some cephalo malacia and mild dilatation of the left lateral ventricle. These changes are stable from the recent exam. No findings to suggest acute hemorrhage, acute infarction or space-occupying mass lesion are noted. Basal ganglia calcifications on the left are noted and stable. Diffuse atrophic changes are again identified and stable. Vascular: No hyperdense vessel or unexpected calcification. Skull: Normal. Negative for fracture or focal lesion. Sinuses/Orbits: Orbits are within normal limits. Mild mucosal thickening in the left maxillary antrum is seen slightly increased when compared with prior exam. Other: None. IMPRESSION: Chronic changes consistent with left MCA infarct. Chronic atrophic and ischemic changes. No acute abnormality is noted. Electronically Signed   By: Inez Catalina M.D.   On: 03/11/2019 17:37   Dg Chest Portable 1 View  Result Date: 03/13/2019 CLINICAL DATA:  83 year old male with shortness of breath and wheezing. History of CHF. EXAM: PORTABLE CHEST 1 VIEW COMPARISON:  Chest radiograph dated 03/11/2019 FINDINGS: Stable cardiomegaly. Probable mild vascular congestion. Overall improved aeration of the lungs since the prior radiograph. Bibasilar atelectatic changes noted. No large pleural effusion. There is no pneumothorax. Atherosclerotic calcification of the aorta. No acute osseous pathology. IMPRESSION: Cardiomegaly with probable mild vascular congestion. Overall improved aeration of the lungs since the prior radiograph. Electronically Signed   By: Anner Crete M.D.   On: 03/13/2019 00:28   Dg Chest Port 1 View  Result Date: 03/11/2019 CLINICAL DATA:  Weakness EXAM: PORTABLE CHEST 1 VIEW COMPARISON:  01/24/2019 FINDINGS: Cardiac shadow is enlarged but stable. Aortic calcifications are again seen. Mild central vascular congestion is again noted. Some patchy  infiltrative changes are noted in the bases bilaterally but relatively stable from the prior study. Mild interstitial edema is seen. IMPRESSION: Patchy bibasilar infiltrate/atelectasis. Mild interstitial edema. Electronically Signed   By: Inez Catalina M.D.   On: 03/11/2019 17:52    Pending Labs Unresulted Labs (From admission, onward)    Start     Ordered   03/14/19 1914  Basic metabolic panel  Daily,   STAT     03/13/19 0133   03/13/19 0500  CBC WITH DIFFERENTIAL  Once,   STAT     03/13/19 0133   03/13/19 0500  Comprehensive metabolic panel  Daily,   STAT     03/13/19 0133   03/13/19 0134  TSH  Once,   STAT  03/13/19 0133   03/13/19 0134  Brain natriuretic peptide  Once,   STAT     03/13/19 0133   03/12/19 2341  Blood culture (routine x 2)  BLOOD CULTURE X 2,   STAT     03/12/19 2340   03/12/19 2341  Lactic acid, plasma  Now then every 2 hours,   STAT     03/12/19 2340          Vitals/Pain Today's Vitals   03/13/19 0015 03/13/19 0030 03/13/19 0100 03/13/19 0130  BP:  (!) 168/112 (!) 163/99 (!) 145/86  Pulse: 99 99 (!) 105 100  Resp: (!) 30 (!) 28 (!) 30 (!) 28  Temp:      TempSrc:      SpO2: 94% 91% 94% (!) 85%    Isolation Precautions No active isolations  Medications Medications  cephALEXin (KEFLEX) capsule 500 mg (has no administration in time range)  lisinopril (ZESTRIL) tablet 10 mg (has no administration in time range)  metoprolol tartrate (LOPRESSOR) tablet 25 mg (has no administration in time range)  pravastatin (PRAVACHOL) tablet 40 mg (has no administration in time range)  pantoprazole (PROTONIX) EC tablet 40 mg (has no administration in time range)  finasteride (PROSCAR) tablet 5 mg (has no administration in time range)  apixaban (ELIQUIS) tablet 2.5 mg (has no administration in time range)  tamsulosin (FLOMAX) capsule 0.4 mg (has no administration in time range)  albuterol (PROVENTIL) (2.5 MG/3ML) 0.083% nebulizer solution 3 mL (has no administration  in time range)  sodium chloride flush (NS) 0.9 % injection 3 mL (has no administration in time range)  sodium chloride flush (NS) 0.9 % injection 3 mL (has no administration in time range)  0.9 %  sodium chloride infusion (has no administration in time range)  acetaminophen (TYLENOL) tablet 650 mg (has no administration in time range)  ondansetron (ZOFRAN) injection 4 mg (has no administration in time range)  furosemide (LASIX) injection 40 mg (has no administration in time range)  aspirin EC tablet 81 mg (has no administration in time range)  zolpidem (AMBIEN) tablet 5 mg (has no administration in time range)  furosemide (LASIX) injection 40 mg (40 mg Intravenous Given 03/13/19 0052)  nitroGLYCERIN (NITROGLYN) 2 % ointment 1 inch (1 inch Topical Given 03/13/19 0102)    Mobility manual wheelchair Moderate fall risk   Focused Assessments Cardiac Assessment Handoff:    Lab Results  Component Value Date   TROPONINI 0.07 (HH) 01/24/2019   No results found for: DDIMER Does the Patient currently have chest pain? No     R Recommendations: See Admitting Provider Note  Report given to:   Additional Notes:

## 2019-03-14 LAB — COMPREHENSIVE METABOLIC PANEL
ALT: 11 U/L (ref 0–44)
AST: 14 U/L — ABNORMAL LOW (ref 15–41)
Albumin: 4 g/dL (ref 3.5–5.0)
Alkaline Phosphatase: 74 U/L (ref 38–126)
Anion gap: 10 (ref 5–15)
BUN: 33 mg/dL — ABNORMAL HIGH (ref 8–23)
CO2: 30 mmol/L (ref 22–32)
Calcium: 9.1 mg/dL (ref 8.9–10.3)
Chloride: 104 mmol/L (ref 98–111)
Creatinine, Ser: 1.16 mg/dL (ref 0.61–1.24)
GFR calc Af Amer: >60 mL/min (ref >60)
GFR calc non Af Amer: 59 mL/min — ABNORMAL LOW (ref >60)
Glucose, Bld: 179 mg/dL — ABNORMAL HIGH (ref 70–99)
Potassium: 3.4 mmol/L — ABNORMAL LOW (ref 3.5–5.1)
Sodium: 144 mmol/L (ref 135–145)
Total Bilirubin: 1.7 mg/dL — ABNORMAL HIGH (ref 0.3–1.2)
Total Protein: 7.1 g/dL (ref 6.5–8.1)

## 2019-03-14 MED ORDER — FUROSEMIDE 40 MG PO TABS: 40.0000 mg | ORAL_TABLET | Freq: Two times a day (BID) | ORAL | 0 refills | Status: DC

## 2019-03-14 MED ORDER — POTASSIUM CHLORIDE CRYS ER 20 MEQ PO TBCR: 20.0000 meq | EXTENDED_RELEASE_TABLET | Freq: Two times a day (BID) | ORAL | 0 refills | Status: DC

## 2019-03-14 MED ORDER — METOPROLOL TARTRATE 37.5 MG PO TABS: 37.5000 mg | ORAL_TABLET | Freq: Two times a day (BID) | ORAL | 0 refills | Status: DC

## 2019-03-14 MED ORDER — SPIRONOLACTONE 25 MG PO TABS: 12.5000 mg | ORAL_TABLET | Freq: Every day | ORAL | 0 refills | Status: AC

## 2019-03-14 MED ORDER — APIXABAN 5 MG PO TABS: 5.0000 mg | ORAL_TABLET | Freq: Two times a day (BID) | ORAL | 0 refills | Status: AC

## 2019-03-14 NOTE — Progress Notes (Signed)
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   Primary Care Provider: [facility] Primary Cardiologist: [facility], Dr. Elisabeth Cara  CHF Education:   Rounded on patient.  Patient sitting in recliner with oxygen via [place] in place.  Son at bedside.  The Arabic interpreter on the stick was not the same dialect as the patient.  Had to use the patient's son as Optometrist.  Per Son, patient resides with his son in [place] for two months and then lives here in [place] with him for two months.   ?? Educational session with patient / son completed.  Provided patient with "Living Better with Heart Failure" packet. Reviewed material in booklet with patient's son.  ? *Reviewed importance of and reason behind checking weight daily in the AM, after using the bathroom, but before getting dressed. Patient has functioning scale.  Patient's son stated he his not sure if his mother is weighing patient prior to patient urinating or after urinating in the morning. Explanation as to why patient should be weighed afterwards provided.  Son verbalized understanding.  Son to clarify with his mother.    ? *Reviewed with patient the following information: *Discussed when to call the Dr= weight gain of >2-3lb overnight of 5lb in a week,  *Discussed yellow zone= call MD: weight gain of >2-3lb overnight of 5lb in a week, increased swelling, increased SOB when lying down, chest discomfort, dizziness, increased fatigue *Red Zone= call 911: struggle to breath, fainting or near fainting, significant chest pain  HF Zone Magnet  provided to patient / son.    *Reviewed low sodium diet (2000 mg)-provided handout of recommended and not recommended foods.Handout on the sodium content foods given and reviewed with son.   ? *Discussed fluid intake with patient as well. Patient not currently on an ordered fluid restriction, but son stated patient was advised by cardiologist this a.m. no more than 1.5 liters of fluid  per day.  Son informed this RN patient had been drinking a lot of fluids.  Explained to son if patient is drinking more fluids than he is putting out each day, then he will be gradually retaining fluid every day.  Patient's son verbalized understanding.   ? *Instructed patient to take medications as prescribed for heart failure. Explained briefly why pt is on the medications (either make you feel better, live longer or keep you out of the hospital) and discussed monitoring and side effects. Assigned RN to review with patient / son when the next doses for each medication are due prior to discharge.   ? *Discussed exercise / activity.  Son indicated that patient has no problem walks around the house at home.  Explained to son given patient's dx of acute systolic HF with an EF of 35 - 40% patient is a candidate for the ourpatient Cardiac Rehab program.  Our brochure given to son as a reminder for patient and family to discuss Cardiac Rehab with patient's primary Cardiologist during his next visit.  Encouraged patient to remain as active as possible.    *Smoking Cessation- Patient is a  FORMER smoker.? ? *[facility] - Patient is known [facility]. Discussed the appointment scheduled for patient on August 6th, 2020.  Son explained that he will be seeing [place] Cardiologist in [place] on [place] 4th and therefore, will not need to keep this appt.  HF Clinic Appt cancelled as requested.  ? Patient/son refused HH services.   ? Again, the 5 Steps to Living Better with Heart Failure were reviewed with patient.   ? Patient thanked me for providing the above information. ? ? Army Meliaiane Rudie Rikard, RN, BSN, [facility]? [facility]  [facility] Cardiac &?Pulmonary Rehab  Cardiovascular &?Pulmonary Nurse Navigator  Direct Line: [phone]  Department Phone #: [phone] Fax: [phone]? Email Address: [email or web address]

## 2019-03-14 NOTE — Evaluation (Signed)
Physical Therapy Evaluation Patient Details Name: Brian Baker MRN: [id number] DOB: September 19, 1935 Today's Date: 03/14/2019   History of Present Illness  83 y.o. male with extensive chronic medical history as listed below including some chronic right-sided deficits after prior stroke as per prior documentation in his chart.  The patient was evaluated yesterday for some generalized weakness and diagnosed with a urinary tract infection; he had an extensive work-up and at the time had no respiratory complaints or concerns.  He tested negative for COVID-19.  Uses 2L O2 at night and during exertion/out of home ambulation.  Clinical Impression  Pt did relatively well with PT exam and though his O2 was in the high 80s/low 90s on 2 L O2 t/o the session he appeared confidentand he and son reiterated that he felt essentially near baseline and ready to go home.  He was able to ambulate >100 ft with only minimal UE reliance and no subjective fatigue.  He will have 24/7 assist and at this point does not feeling the need for further PT f/u, which this PT agrees with.  Able to safely d/c from PT perspective, will sign off.     Follow Up Recommendations No PT follow up    Equipment Recommendations  None recommended by PT    Recommendations for Other Services       Precautions / Restrictions Precautions Precautions: Fall Restrictions Weight Bearing Restrictions: No      Mobility  Bed Mobility Overal bed mobility: Modified Independent             General bed mobility comments: superficial assist from son that appeared their baseline but did not appear phyiscally needed  Transfers Overall transfer level: Modified independent Equipment used: Rolling walker (2 wheeled)             General transfer comment: Pt able to rise to standing w/o direct assist, initially using walker to stabilize but (per baseline) did not seem physicall necessary.  Ambulation/Gait Ambulation/Gait assistance:  Supervision;Modified independent (Device/Increase time) Gait Distance (Feet): 125 Feet Assistive device: Rolling walker (2 wheeled);None(hallway rail)       General Gait Details: PT initially had PT walking with walker, however he did ~100 ft w/o AD and only light UE use on rail (though he was able to transverse door ways, no rail, with confidence.)  Per pt and son he appears close to his baseline, no concerns.  Stairs            Wheelchair Mobility    Modified Rankin (Stroke Patients Only)       Balance Overall balance assessment: Modified Independent                                           Pertinent Vitals/Pain Pain Assessment: No/denies pain    Home Living Family/patient expects to be discharged to:: Private residence Living Arrangements: Spouse/significant other;Children Available Help at Discharge: Available 24 hours/day   Home Access: Level entry       Home Equipment: Cane - single point(O2)      Prior Function Level of Independence: Independent with assistive device(s)         Comments: Pt up ad lib in the home, often able to walk down side walk for moderate distance ambulation out of the home     Hand Dominance        Extremity/Trunk Assessment   Upper Extremity  Assessment Upper Extremity Assessment: Generalized weakness;Overall [facility] for tasks assessed    Lower Extremity Assessment Lower Extremity Assessment: Generalized weakness;Overall WFL for tasks assessed       Communication   Communication: Prefers language other than English(offered tele-interp, pt emphatically indicates OK for son)  Cognition Arousal/Alertness: Awake/alert Behavior During Therapy: WFL for tasks assessed/performed Overall Cognitive Status: Within Functional Limits for tasks assessed                                        General Comments      Exercises     Assessment/Plan    PT Assessment Patent does not need any  further PT services  PT Problem List         PT Treatment Interventions      PT Goals (Current goals can be found in the Care Plan section)  Acute Rehab PT Goals Patient Stated Goal: go home ASAP PT Goal Formulation: All assessment and education complete, DC therapy    Frequency     Barriers to discharge        Co-evaluation               AM-PAC PT "6 Clicks" Mobility  Outcome Measure Help needed turning from your back to your side while in a flat bed without using bedrails?: None Help needed moving from lying on your back to sitting on the side of a flat bed without using bedrails?: None Help needed moving to and from a bed to a chair (including a wheelchair)?: None Help needed standing up from a chair using your arms (e.g., wheelchair or bedside chair)?: None Help needed to walk in hospital room?: None Help needed climbing 3-5 steps with a railing? : A Little 6 Click Score: 23    End of Session Equipment Utilized During Treatment: Gait belt Activity Tolerance: Patient tolerated treatment well Patient left: with chair alarm set;with call bell/phone within reach;with family/visitor present Nurse Communication: Mobility status PT Visit Diagnosis: Difficulty in walking, not elsewhere classified (R26.2)    Time: [id number] PT Time Calculation (min) (ACUTE ONLY): 22 min   Charges:   PT Evaluation $PT Eval Low Complexity: 1 Low          Brian ProGalen R Jacody Baker, DPT 03/14/2019, 12:18 PM

## 2019-03-14 NOTE — Progress Notes (Signed)
Progress Note  Patient Name: Brian Baker Date of Encounter: 03/14/2019  Primary Cardiologist: [facility], Dr. Lynnea FerrierSolomon  Subjective   Son present and translated for patient as specific dialect of Arabic precludes use of translator services. Per son, patient reported that he feels back to baseline with no complaints after oxygen weaned to home level of 2L [place]. SOB improved since admission and with control of ventricular rate / diuresis. No complaints with medication changes. Eager to go home.  Inpatient Medications    Scheduled Meds: . apixaban  5 mg Oral BID  . cephALEXin  500 mg Oral TID  . finasteride  5 mg Oral Daily  . furosemide  40 mg Intravenous Q12H  . lisinopril  10 mg Oral Daily  . metoprolol tartrate  37.5 mg Oral BID  . pantoprazole  40 mg Oral Daily  . potassium chloride  20 mEq Oral BID  . pravastatin  40 mg Oral q1800  . sodium chloride flush  3 mL Intravenous Q12H  . spironolactone  12.5 mg Oral Daily  . tamsulosin  0.4 mg Oral Daily   Continuous Infusions: . sodium chloride     PRN Meds: sodium chloride, acetaminophen, albuterol, ondansetron (ZOFRAN) IV, sodium chloride flush, zolpidem   Vital Signs    Vitals:   03/13/19 2004 03/14/19 0346 03/14/19 0436 03/14/19 0751  BP: (!) 132/94 (!) 129/96  140/85  Pulse: (!) 105 75  83  Resp: 16 18  18   Temp: 97.7 F (36.5 C) 98.4 F (36.9 C)  98.4 F (36.9 C)  TempSrc: Oral Oral    SpO2: 93% 90%  97%  Weight:   63.7 kg   Height:        Intake/Output Summary (Last 24 hours) at 03/14/2019 1227 Last data filed at 03/14/2019 1100 Gross per 24 hour  Intake -  Output 2300 ml  Net -2300 ml   Last 3 Weights 03/14/2019 03/13/2019 03/11/2019  Weight (lbs) 140 lb 6.9 oz 141 lb 1.5 oz 160 lb  Weight (kg) 63.7 kg 64 kg 72.576 kg      Telemetry   IRIR with rates 80-low 100s, PVCs / aberrancy - Personally Reviewed  ECG    No new tracings - Personally Reviewed  Physical Exam   GEN: No acute distress.  Neck: No  JVD Cardiac: IRIR, 1/6 systolic murmur. No rubs, or gallops.  Respiratory: Coarse breath sounds with expiratory wheeze. GI: Soft, nontender, non-distended  MS: No edema; No deformity. Neuro:  Nonfocal  Psych: Normal affect, pleasant  Labs    High Sensitivity Troponin:   Recent Labs  Lab 03/11/19 1511 03/11/19 1920 03/12/19 2239 03/13/19 0443 03/13/19 0655  TROPONINIHS 36* 34* 41* 43* 45*      Cardiac EnzymesNo results for input(s): TROPONINI in the last 168 hours. No results for input(s): TROPIPOC in the last 168 hours.   Chemistry Recent Labs  Lab 03/12/19 2239 03/13/19 0443 03/14/19 0456  NA 142 141 144  K 3.2* 3.1* 3.4*  CL 102 101 104  CO2 29 30 30   GLUCOSE 190* 176* 179*  BUN 25* 21 33*  CREATININE 0.95 1.02 1.16  CALCIUM 9.4 9.0 9.1  PROT 7.9 7.1 7.1  ALBUMIN 4.7 4.2 4.0  AST 18 15 14*  ALT 14 12 11   ALKPHOS 79 65 74  BILITOT 1.8* 2.1* 1.7*  GFRNONAA >60 >60 59*  GFRAA >60 >60 >60  ANIONGAP 11 10 10      Hematology Recent Labs  Lab 03/11/19 1511 03/12/19 2239  03/13/19 0443  WBC 7.3 11.5* 9.1  RBC 5.07 4.86 4.84  HGB 12.9* 12.5* 12.5*  HCT 42.2 40.4 39.6  MCV 83.2 83.1 81.8  MCH 25.4* 25.7* 25.8*  MCHC 30.6 30.9 31.6  RDW 15.3 15.1 15.1  PLT 105* 108* 109*    BNP Recent Labs  Lab 03/12/19 2239 03/13/19 0443  BNP 611.0* 721.0*     DDimer No results for input(s): DDIMER in the last 168 hours.   Radiology    Dg Chest Portable 1 View  Result Date: 03/13/2019 CLINICAL DATA:  83 year old male with shortness of breath and wheezing. History of CHF. EXAM: PORTABLE CHEST 1 VIEW COMPARISON:  Chest radiograph dated 03/11/2019 FINDINGS: Stable cardiomegaly. Probable mild vascular congestion. Overall improved aeration of the lungs since the prior radiograph. Bibasilar atelectatic changes noted. No large pleural effusion. There is no pneumothorax. Atherosclerotic calcification of the aorta. No acute osseous pathology. IMPRESSION: Cardiomegaly with  probable mild vascular congestion. Overall improved aeration of the lungs since the prior radiograph. Electronically Signed   By: Anner Crete M.D.   On: 03/13/2019 00:28    Cardiac Studies   TTE 01/25/2019 The left ventricle has moderately reduced systolic function, with an ejection fraction of 35-40%. The cavity size was normal. There is moderately increased left ventricular wall thickness. Left ventricular diastolic Doppler parameters are  indeterminate.Global hypokinesis, select images suggests significant inferior wall hypokinesis. 2. The right ventricle has mildly reduced systolic function. The cavity was normal. There is no increase in right ventricular wall thickness. Right ventricular systolic pressure is severely elevated with an estimated pressure of 62.9 mmHg. 3. Left atrial size was severely dilated. 4. Right atrial size was moderately dilated. 5. Mitral valve regurgitation is mild to moderate 6. Tricuspid valve regurgitation is mild-moderate.  TTE 05/2018 Summary:  1. There is slightly decreased left ventricular systolic function. --with low normal LV wall motion globally 2. The ejection fraction is estimated to be 50%. 3. Abnormal diastolic filling pattern consistent with underlying atrial fibrillation was observed. 4. The left atrium is moderate to severely dilated.  5. The right atrium is mildly dilated. 6. There is moderate mitral regurgitation observed. --jet poorly visualized, 2 jets 7. There is mild dilatation of the ascending aorta. --at 3.57 cm 8. The inferior vena cava is not visualized. --possibly small in sizeSummary:  1. There is slightly decreased left ventricular systolic function. --with low normal LV wall motion globally 2. The ejection fraction is estimated to be 50%. 3. Abnormal diastolic filling pattern consistent with underlying atrial fibrillation was observed. 4. The left atrium is moderate to severely dilated.  5. The right atrium is  mildly dilated. 6. There is moderate mitral regurgitation observed. --jet poorly visualized, 2 jets 7. There is mild dilatation of the ascending aorta. --at 3.57 cm 8. The inferior vena cava is not visualized. --possibly small in size  Echo with bubble study 12/11/2016 Summary:  1. No pulmonary hypertension is noted. 2. The inferior vena cava appears normal suggesting normal central venous pressure. 3. There is mildly decreased left ventricular systolic function. 4. Mild global hypokinesis of the left ventricle is observed. 5. The EF is estimated at 40-45%. 6. The right atrium is mildly dilated. 7. The left atrium is mild to moderately dilated.  8. There is moderate mitral regurgitation observed. 9. There is no evidence of a patent foramen ovale by color Doppler and agitated saline contrast.  10. The left ventricle mass index and the relative wall thickness values indicate concentric hypertrophy.  Patient Profile     83 y.o. 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  Assessment & Plan    Acute on chronic HFrEF (35-40%), pulmonary HTN -Improvement in shortness of breath with ventricular rate control and gentle IV diuresis.  --Suspicion that SOB and volume overload 2/2 suboptimally controlled ventricular rates in Afib with subsequent improvement following increased BB dose this admission. As previously noted, patient does not appear volume overloaded on exam; however, BNP elevated 611.0 (7/21)  721.0 (7/22). 6/5 echo showed moderately reduced systolic function with EF 35 to 40%, global hypokinesis, RVSP 62.689mmHg.  -Continue increased dose of metoprolol 37.5  mg twice daily with PTA lisinopril 10mg  daily. Given reduced EF and hypokalemia, recommend addition of spironolactone (with follow-up BMET per primary cardiologist). Transition IV diuresis back to oral lasix 40mg  BID today and with PTA supplemental potassium continued.  --Recommend successful ambulation before discharge and with weaned oxygen as tolerated. [facility]Dr. Lynnea FerrierSolomon) with existing appt scheduled in August 4th. Recommend follow-up BMET at follow-up appointment with primary cardiologist given hypokalemia, UTI with diuresis, and if spironolactone started before discharge.   Permanent Afib with RVR.   - CHA2DS2VASc score of at least 7 (CHF, HTN, agex2, strokex2, vascular). --Continue anticoagulation. Increased to Eliquis 5mg  BID given patient does not meet dose reduction criteria (2 of 3 criteria).  --Continue increased dose of BB with metoprolol tartrate 37.5mg  BID for more optimal rate control. Future considerations could include consolidation to Toprol XL per primary cardiologist.  Troponin elevation without chest pain ICM (EF 35-40%) -No chest pain. Troponin mildly elevated, flat trending at 41  43  45. EKG without acute changes. Suspect supply demand given elevated ventricular rate, UTI. - No plan for cardiac catheterization this admission; however, as previously noted, further outpatient ischemic work-up recommended with cardiac catheterization given decreased EF and hypokinesis as above during 01/2019 admission.   -Continue medical management as directly above.  Aggressive risk factor modification recommended, including statin therapy. --Follow[facility] with primary Cardiologist, [facility]  MR/TR - Recommend continue to monitor with periodic echo  Hypokalemia - Improving. K 3.1  3.4. Replete with goal 4.0 as above. Mg 1.8. - Recommend continue outpatient KCl supplementation. Recommend consider addition of spironolactone as above.   HTN -Improving. SBP 170s and currently 140s. Sub-optimally controlled this morning. Increased BB. Future adjustments to medication could include increased dose ACE (currently on lisinopril 10mg  daily) or addition of spironolactone.  Continue lasix.  HLD -Continue statin therapy  -Goal LDL below 70.   For questions or updates, please contact [facility] Please consult [email or web address] for contact info under        Signed, Lennon AlstromJacquelyn D Seferino Oscar, PA-C  03/14/2019, 12:27 PM

## 2019-03-14 NOTE — Discharge Summary (Signed)
[facility] [place] [facility]   PATIENT NAME: Brian Baker    MR#:  [id number]  DATE OF BIRTH:  05/27/1936  DATE OF ADMISSION:  03/12/2019 ADMITTING PHYSICIAN: Hannah BeatJan A Mansy, MD  DATE OF DISCHARGE: 03/14/2019 11:33 AM  [facility]    ADMISSION DIAGNOSIS:  Chronic atrial fibrillation [I48.20] Uncontrolled hypertension [I10] Acute respiratory failure with hypoxemia (HCC) [J96.01] Acute on chronic congestive heart failure, unspecified heart failure type (HCC) [I50.9]  DISCHARGE DIAGNOSIS:  Active Problems:   Acute CHF (congestive heart failure) (HCC)   Acute on chronic combined systolic and diastolic CHF (congestive heart failure) (HCC)   Permanent atrial fibrillation   SECONDARY DIAGNOSIS:   Past Medical History:  Diagnosis Date  . Arrhythmia    atrial fibrillation  . CHF (congestive heart failure) (HCC)   . COPD (chronic obstructive pulmonary disease) (HCC)   . Enlarged prostate   . Hyperlipemia   . Hypertension   . Stroke [facility](HCC)     HOSPITAL COURSE:   1.  Acute on chronic systolic congestive heart failure with EF of 35 to 40%.  Patient was diuresed with IV Lasix 40 mg IV twice daily.  I will switch over to 40 mg Lasix orally twice daily.  Patient's metoprolol was increased by cardiology.  On lisinopril.  Low-dose Spironolactone added.  Recommend checking a BMP and follow-up appointment.  Likely can get rid of potassium replacement and follow-up appointment. 2.  Atrial fibrillation on increased dose metoprolol and Eliquis increased dose. 3.  Acute cystitis with E. coli on cephalexin.  Finish up course. 4.  Hypertension on lisinopril and metoprolol 5.  History of stroke on aspirin and Eliquis 6.  Hyperlipidemia unspecified on pravastatin 7.  BPH on Flomax 8.  Weakness.  Patient did well with physical therapy 9.  History of stroke and atrial fibrillation on Eliquis  Son was the translator because the Arabic dialect  with the interpreter was not correct and since the patient had a stroke it is hard for the patient to hear and communicate.  DISCHARGE CONDITIONS:   Satisfactory  CONSULTS OBTAINED:  Treatment Team:  Yvonne KendallEnd, Christopher, MD  DRUG ALLERGIES:  No Known Allergies  DISCHARGE MEDICATIONS:   Allergies as of 03/14/2019   No Known Allergies     Medication List    TAKE these medications   albuterol 108 (90 Base) MCG/ACT inhaler Commonly known as: VENTOLIN HFA Inhale 1-2 puffs into the lungs every 4 (four) hours as needed.   apixaban 5 MG Tabs tablet Commonly known as: Eliquis Take 1 tablet (5 mg total) by mouth 2 (two) times daily. What changed: how much to take   cephALEXin 500 MG capsule Commonly known as: KEFLEX Take 1 capsule (500 mg total) by mouth 3 (three) times daily for 7 days.   finasteride 5 MG tablet Commonly known as: PROSCAR TK 1 T PO Q 24 H   furosemide 40 MG tablet Commonly known as: LASIX Take 1 tablet (40 mg total) by mouth 2 (two) times daily. What changed: when to take this   lisinopril 10 MG tablet Commonly known as: ZESTRIL TK 1 T PO  D   Metoprolol Tartrate 37.5 MG Tabs Take 37.5 mg by mouth 2 (two) times daily. What changed:   medication strength  how much to take   omeprazole 20 MG capsule Commonly known as: PRILOSEC Take 20 mg by mouth daily.   potassium chloride SA 20 MEQ tablet Commonly known as:  K-DUR Take 1 tablet (20 mEq total) by mouth 2 (two) times daily.   pravastatin 40 MG tablet Commonly known as: PRAVACHOL TK 1 T PO HS   spironolactone 25 MG tablet Commonly known as: ALDACTONE Take 0.5 tablets (12.5 mg total) by mouth daily.   tamsulosin 0.4 MG Caps capsule Commonly known as: FLOMAX TK ONE C PO QD        DISCHARGE INSTRUCTIONS:   Follow-up PMD 5 days Follow-up cardiology 1 to 2 weeks  If you experience worsening of your admission symptoms, develop shortness of breath, life threatening emergency, suicidal or  homicidal thoughts you must seek medical attention immediately by calling 911 or calling your MD immediately  if symptoms less severe.  You Must read complete instructions/literature along with all the possible adverse reactions/side effects for all the Medicines you take and that have been prescribed to you. Take any new Medicines after you have completely understood and accept all the possible adverse reactions/side effects.   Please note  You were cared for by a hospitalist during your hospital stay. If you have any questions about your discharge medications or the care you received while you were in the hospital after you are discharged, you can call the unit and asked to speak with the hospitalist on call if the hospitalist that took care of you is not available. Once you are discharged, your primary care physician will handle any further medical issues. Please note that NO REFILLS for any discharge medications will be authorized once you are discharged, as it is imperative that you return to your primary care physician (or establish a relationship with a primary care physician if you do not have one) for your aftercare needs so that they can reassess your need for medications and monitor your lab values.    Today   CHIEF COMPLAINT:   Chief Complaint  Patient presents with  . Weakness    HISTORY OF PRESENT ILLNESS:  Brian Baker  is a 83 y.o. male coming in with weakness   VITAL SIGNS:  Blood pressure 140/85, pulse 83, temperature 98.4 F (36.9 C), resp. rate 18, height 5' 4.96" (1.65 m), weight 63.7 kg, SpO2 97 %.  I/O:    Intake/Output Summary (Last 24 hours) at 03/14/2019 1508 Last data filed at 03/14/2019 1100 Gross per 24 hour  Intake -  Output 2300 ml  Net -2300 ml    PHYSICAL EXAMINATION:  GENERAL:  83 y.o.-year-old patient lying in the bed with no acute distress.  EYES: Pupils equal, round, reactive to light and accommodation. No scleral icterus. Extraocular muscles  intact.  HEENT: Head atraumatic, normocephalic. Oropharynx and nasopharynx clear.  NECK:  Supple, no jugular venous distention. No thyroid enlargement, no tenderness.  LUNGS: Decreased breath sounds bilaterally, rales at the bases. No use of accessory muscles of respiration.  CARDIOVASCULAR: S1, S2 normal. No murmurs, rubs, or gallops.  ABDOMEN: Soft, non-tender, non-distended. Bowel sounds present. No organomegaly or mass.  EXTREMITIES: No pedal edema, cyanosis, or clubbing.  NEUROLOGIC: Cranial nerves II through XII are intact. Muscle strength 5/5 in all extremities. Sensation intact. Gait not checked.  PSYCHIATRIC: The patient is alert and oriented x 3.  SKIN: No obvious rash, lesion, or ulcer.   DATA REVIEW:   CBC Recent Labs  Lab 03/13/19 0443  WBC 9.1  HGB 12.5*  HCT 39.6  PLT 109*    Chemistries  Recent Labs  Lab 03/13/19 0443 03/14/19 0456  NA 141 144  K 3.1* 3.4*  CL 101 104  CO2 30 30  GLUCOSE 176* 179*  BUN 21 33*  CREATININE 1.02 1.16  CALCIUM 9.0 9.1  MG 1.8  --   AST 15 14*  ALT 12 11  ALKPHOS 65 74  BILITOT 2.1* 1.7*     Microbiology Results  Results for orders placed or performed during the hospital encounter of 03/12/19  Blood culture (routine x 2)     Status: None (Preliminary result)   Collection Time: 03/12/19 10:39 PM   Specimen: BLOOD  Result Value Ref Range Status   Specimen Description BLOOD LEFT FATTY CASTS  Final   Special Requests   Final    BOTTLES DRAWN AEROBIC AND ANAEROBIC Blood Culture adequate volume   Culture   Final    NO GROWTH < 12 HOURS Performed at [facility], [address], [place], [place] [address]    Report Status PENDING  Incomplete  Blood culture (routine x 2)     Status: None (Preliminary result)   Collection Time: 03/12/19 10:39 PM   Specimen: BLOOD  Result Value Ref Range Status   Specimen Description BLOOD RIGHT WRIST  Final   Special Requests   Final    BOTTLES DRAWN AEROBIC AND ANAEROBIC  Blood Culture results may not be optimal due to an excessive volume of blood received in culture bottles   Culture   Final    NO GROWTH < 12 HOURS Performed at [facility], [address] [place],  [address]    Report Status PENDING  Incomplete    RADIOLOGY:  Dg Chest Portable 1 View  Result Date: 03/13/2019 CLINICAL DATA:  83 year old male with shortness of breath and wheezing. History of CHF. EXAM: PORTABLE CHEST 1 VIEW COMPARISON:  Chest radiograph dated 03/11/2019 FINDINGS: Stable cardiomegaly. Probable mild vascular congestion. Overall improved aeration of the lungs since the prior radiograph. Bibasilar atelectatic changes noted. No large pleural effusion. There is no pneumothorax. Atherosclerotic calcification of the aorta. No acute osseous pathology. IMPRESSION: Cardiomegaly with probable mild vascular congestion. Overall improved aeration of the lungs since the prior radiograph. Electronically Signed   By: Anner Crete M.D.   On: 03/13/2019 00:28     Management plans discussed with the patient, family and they are in agreement.  CODE STATUS:     Code Status Orders  (From admission, onward)         Start     Ordered   03/13/19 0134  Full code  Continuous     03/13/19 0133        Code Status History    Date Active Date Inactive Code Status Order ID Comments User Context   01/24/2019 2354 01/25/2019 1904 Full Code [id number]  Mansy, Arvella Merles, MD ED   Advance Care Planning Activity      TOTAL TIME TAKING CARE OF THIS PATIENT: 35 minutes.    Loletha Grayer M.D on 03/14/2019 at 3:08 PM  Between 7am to 6pm - Pager - [phone]  After 6pm go to [email or web address] - Proofreader  [facility] Office  [phone]  CC: Primary care physician; [facility] [place]

## 2019-03-17 LAB — CULTURE, BLOOD (ROUTINE X 2)
Culture: NO GROWTH
Culture: NO GROWTH
Special Requests: ADEQUATE

## 2019-03-28 ENCOUNTER — Ambulatory Visit: Payer: Medicaid Other | Admitting: Family

## 2019-06-27 ENCOUNTER — Emergency Department: Payer: Medicaid Other

## 2019-06-27 ENCOUNTER — Encounter: Payer: Self-pay | Admitting: Intensive Care

## 2019-06-27 ENCOUNTER — Other Ambulatory Visit: Payer: Self-pay

## 2019-06-27 ENCOUNTER — Inpatient Hospital Stay
Admission: EM | Admit: 2019-06-27 | Discharge: 2019-06-30 | DRG: 291 | Disposition: A | Discharge status: Home Health | Payer: Medicaid Other | Attending: Internal Medicine | Admitting: Internal Medicine

## 2019-06-27 DIAGNOSIS — I081 Rheumatic disorders of both mitral and tricuspid valves: Secondary | ICD-10-CM | POA: Diagnosis present

## 2019-06-27 DIAGNOSIS — I160 Hypertensive urgency: Secondary | ICD-10-CM | POA: Diagnosis present

## 2019-06-27 DIAGNOSIS — I10 Essential (primary) hypertension: Secondary | ICD-10-CM | POA: Diagnosis not present

## 2019-06-27 DIAGNOSIS — K219 Gastro-esophageal reflux disease without esophagitis: Secondary | ICD-10-CM | POA: Diagnosis present

## 2019-06-27 DIAGNOSIS — N400 Enlarged prostate without lower urinary tract symptoms: Secondary | ICD-10-CM | POA: Diagnosis present

## 2019-06-27 DIAGNOSIS — I272 Pulmonary hypertension, unspecified: Secondary | ICD-10-CM | POA: Diagnosis present

## 2019-06-27 DIAGNOSIS — I252 Old myocardial infarction: Secondary | ICD-10-CM | POA: Diagnosis not present

## 2019-06-27 DIAGNOSIS — I5043 Acute on chronic combined systolic (congestive) and diastolic (congestive) heart failure: Secondary | ICD-10-CM | POA: Diagnosis present

## 2019-06-27 DIAGNOSIS — I2510 Atherosclerotic heart disease of native coronary artery without angina pectoris: Secondary | ICD-10-CM | POA: Diagnosis present

## 2019-06-27 DIAGNOSIS — J9601 Acute respiratory failure with hypoxia: Secondary | ICD-10-CM | POA: Diagnosis present

## 2019-06-27 DIAGNOSIS — E876 Hypokalemia: Secondary | ICD-10-CM | POA: Diagnosis present

## 2019-06-27 DIAGNOSIS — Z20828 Contact with and (suspected) exposure to other viral communicable diseases: Secondary | ICD-10-CM | POA: Diagnosis present

## 2019-06-27 DIAGNOSIS — I110 Hypertensive heart disease with heart failure: Secondary | ICD-10-CM | POA: Diagnosis present

## 2019-06-27 DIAGNOSIS — J449 Chronic obstructive pulmonary disease, unspecified: Secondary | ICD-10-CM | POA: Diagnosis present

## 2019-06-27 DIAGNOSIS — R778 Other specified abnormalities of plasma proteins: Secondary | ICD-10-CM | POA: Diagnosis present

## 2019-06-27 DIAGNOSIS — Z87891 Personal history of nicotine dependence: Secondary | ICD-10-CM

## 2019-06-27 DIAGNOSIS — E785 Hyperlipidemia, unspecified: Secondary | ICD-10-CM | POA: Diagnosis present

## 2019-06-27 DIAGNOSIS — J9621 Acute and chronic respiratory failure with hypoxia: Secondary | ICD-10-CM | POA: Diagnosis present

## 2019-06-27 DIAGNOSIS — Z79899 Other long term (current) drug therapy: Secondary | ICD-10-CM

## 2019-06-27 DIAGNOSIS — I248 Other forms of acute ischemic heart disease: Secondary | ICD-10-CM | POA: Diagnosis present

## 2019-06-27 DIAGNOSIS — Z8673 Personal history of transient ischemic attack (TIA), and cerebral infarction without residual deficits: Secondary | ICD-10-CM

## 2019-06-27 DIAGNOSIS — I340 Nonrheumatic mitral (valve) insufficiency: Secondary | ICD-10-CM | POA: Diagnosis not present

## 2019-06-27 DIAGNOSIS — I4821 Permanent atrial fibrillation: Secondary | ICD-10-CM | POA: Diagnosis present

## 2019-06-27 DIAGNOSIS — Z7901 Long term (current) use of anticoagulants: Secondary | ICD-10-CM | POA: Diagnosis not present

## 2019-06-27 DIAGNOSIS — D649 Anemia, unspecified: Secondary | ICD-10-CM | POA: Diagnosis present

## 2019-06-27 DIAGNOSIS — I361 Nonrheumatic tricuspid (valve) insufficiency: Secondary | ICD-10-CM | POA: Diagnosis not present

## 2019-06-27 DIAGNOSIS — I639 Cerebral infarction, unspecified: Secondary | ICD-10-CM | POA: Diagnosis present

## 2019-06-27 LAB — CBC
HCT: 38.3 % — ABNORMAL LOW (ref 39.0–52.0)
Hemoglobin: 12 g/dL — ABNORMAL LOW (ref 13.0–17.0)
MCH: 25.6 pg — ABNORMAL LOW (ref 26.0–34.0)
MCHC: 31.3 g/dL (ref 30.0–36.0)
MCV: 81.7 fL (ref 80.0–100.0)
Platelets: 143 10*3/uL — ABNORMAL LOW (ref 150–400)
RBC: 4.69 MIL/uL (ref 4.22–5.81)
RDW: 14.8 % (ref 11.5–15.5)
WBC: 6.5 10*3/uL (ref 4.0–10.5)
nRBC: 0 % (ref 0.0–0.2)

## 2019-06-27 LAB — BASIC METABOLIC PANEL
Anion gap: 12 (ref 5–15)
BUN: 23 mg/dL (ref 8–23)
CO2: 28 mmol/L (ref 22–32)
Calcium: 9.5 mg/dL (ref 8.9–10.3)
Chloride: 103 mmol/L (ref 98–111)
Creatinine, Ser: 0.95 mg/dL (ref 0.61–1.24)
GFR calc Af Amer: >60 mL/min (ref >60)
GFR calc non Af Amer: >60 mL/min (ref >60)
Glucose, Bld: 204 mg/dL — ABNORMAL HIGH (ref 70–99)
Potassium: 2.9 mmol/L — ABNORMAL LOW (ref 3.5–5.1)
Sodium: 143 mmol/L (ref 135–145)

## 2019-06-27 LAB — TROPONIN I (HIGH SENSITIVITY): Troponin I (High Sensitivity): 33 ng/L — ABNORMAL HIGH (ref <18)

## 2019-06-27 LAB — BRAIN NATRIURETIC PEPTIDE: B Natriuretic Peptide: 533 pg/mL — ABNORMAL HIGH (ref 0.0–100.0)

## 2019-06-27 LAB — PROTIME-INR
INR: 1.4 — ABNORMAL HIGH (ref 0.8–1.2)
Prothrombin Time: 17.3 seconds — ABNORMAL HIGH (ref 11.4–15.2)

## 2019-06-27 LAB — MAGNESIUM: Magnesium: 1.8 mg/dL (ref 1.7–2.4)

## 2019-06-27 MED ORDER — SODIUM CHLORIDE 0.9% FLUSH
3.0000 mL | Freq: Two times a day (BID) | INTRAVENOUS | Status: DC
  Administered 2019-06-28 – 2019-06-30 (×5): 3 mL via INTRAVENOUS

## 2019-06-27 MED ORDER — FUROSEMIDE 10 MG/ML IJ SOLN
40.0000 mg | Freq: Once | INTRAMUSCULAR | Status: AC
  Administered 2019-06-27: 40 mg via INTRAVENOUS
  Filled 2019-06-27: qty 4

## 2019-06-27 MED ORDER — POTASSIUM CHLORIDE 20 MEQ PO PACK
40.0000 meq | PACK | Freq: Once | ORAL | Status: AC
  Administered 2019-06-27: 22:00:00 40 meq via ORAL
  Filled 2019-06-27: qty 2

## 2019-06-27 MED ORDER — APIXABAN 2.5 MG PO TABS
2.5000 mg | ORAL_TABLET | Freq: Two times a day (BID) | ORAL | Status: DC
  Administered 2019-06-28 (×2): 2.5 mg via ORAL
  Filled 2019-06-27 (×4): qty 1

## 2019-06-27 MED ORDER — POTASSIUM CHLORIDE 20 MEQ PO PACK
40.0000 meq | PACK | Freq: Once | ORAL | Status: DC
  Filled 2019-06-27: qty 2

## 2019-06-27 MED ORDER — PRAVASTATIN SODIUM 40 MG PO TABS
40.0000 mg | ORAL_TABLET | Freq: Every day | ORAL | Status: DC
  Administered 2019-06-28 – 2019-06-29 (×2): 40 mg via ORAL
  Filled 2019-06-27 (×2): qty 1

## 2019-06-27 MED ORDER — TAMSULOSIN HCL 0.4 MG PO CAPS
0.4000 mg | ORAL_CAPSULE | Freq: Every day | ORAL | Status: DC
  Administered 2019-06-28 – 2019-06-30 (×3): 0.4 mg via ORAL
  Filled 2019-06-27 (×3): qty 1

## 2019-06-27 MED ORDER — FUROSEMIDE 10 MG/ML IJ SOLN
40.0000 mg | Freq: Two times a day (BID) | INTRAMUSCULAR | Status: DC
  Administered 2019-06-28 – 2019-06-30 (×5): 40 mg via INTRAVENOUS
  Filled 2019-06-27 (×5): qty 4

## 2019-06-27 MED ORDER — METOPROLOL TARTRATE 25 MG PO TABS
37.5000 mg | ORAL_TABLET | Freq: Two times a day (BID) | ORAL | Status: DC
  Administered 2019-06-28 (×2): 37.5 mg via ORAL
  Filled 2019-06-27 (×2): qty 2

## 2019-06-27 MED ORDER — ONDANSETRON HCL 4 MG/2ML IJ SOLN: 4.0000 mg | Freq: Four times a day (QID) | INTRAMUSCULAR | Status: DC | PRN

## 2019-06-27 MED ORDER — LISINOPRIL 10 MG PO TABS
10.0000 mg | ORAL_TABLET | Freq: Every day | ORAL | Status: DC
  Administered 2019-06-28 – 2019-06-30 (×3): 10 mg via ORAL
  Filled 2019-06-27 (×3): qty 1

## 2019-06-27 MED ORDER — ACETAMINOPHEN 325 MG PO TABS: 650.0000 mg | ORAL_TABLET | ORAL | Status: DC | PRN

## 2019-06-27 MED ORDER — SODIUM CHLORIDE 0.9% FLUSH: 3.0000 mL | INTRAVENOUS | Status: DC | PRN

## 2019-06-27 MED ORDER — ZOLPIDEM TARTRATE 5 MG PO TABS
5.0000 mg | ORAL_TABLET | Freq: Every evening | ORAL | Status: DC | PRN
  Filled 2019-06-27: qty 1

## 2019-06-27 MED ORDER — ASPIRIN EC 81 MG PO TBEC
81.0000 mg | DELAYED_RELEASE_TABLET | Freq: Every day | ORAL | Status: DC
  Administered 2019-06-28 – 2019-06-30 (×3): 81 mg via ORAL
  Filled 2019-06-27 (×3): qty 1

## 2019-06-27 MED ORDER — ALBUTEROL SULFATE HFA 108 (90 BASE) MCG/ACT IN AERS: 2.0000 | INHALATION_SPRAY | RESPIRATORY_TRACT | Status: DC | PRN

## 2019-06-27 MED ORDER — POTASSIUM CHLORIDE CRYS ER 20 MEQ PO TBCR
20.0000 meq | EXTENDED_RELEASE_TABLET | Freq: Two times a day (BID) | ORAL | Status: DC
  Administered 2019-06-28: 20 meq via ORAL
  Filled 2019-06-27: qty 1

## 2019-06-27 MED ORDER — FINASTERIDE 5 MG PO TABS
5.0000 mg | ORAL_TABLET | Freq: Every day | ORAL | Status: DC
  Administered 2019-06-28 – 2019-06-30 (×3): 5 mg via ORAL
  Filled 2019-06-27 (×4): qty 1

## 2019-06-27 MED ORDER — PANTOPRAZOLE SODIUM 40 MG PO TBEC
40.0000 mg | DELAYED_RELEASE_TABLET | Freq: Every day | ORAL | Status: DC
  Administered 2019-06-28 – 2019-06-30 (×3): 40 mg via ORAL
  Filled 2019-06-27 (×3): qty 1

## 2019-06-27 MED ORDER — SODIUM CHLORIDE 0.9 % IV SOLN: 250.0000 mL | INTRAVENOUS | Status: DC | PRN

## 2019-06-27 MED ORDER — SPIRONOLACTONE 25 MG PO TABS
12.5000 mg | ORAL_TABLET | Freq: Every day | ORAL | Status: DC
  Administered 2019-06-28: 12.5 mg via ORAL
  Filled 2019-06-27: qty 1
  Filled 2019-06-27: qty 0.5

## 2019-06-27 NOTE — H&P (Addendum)
[place] [facility]   PATIENT NAME: Brian Baker    MR#:  [id number]  DATE OF BIRTH:  06/18/1936  DATE OF ADMISSION:  06/27/2019  PRIMARY CARE PHYSIC[facility]: [facility] ([place], Clayborn Bignesshawki Assaad, MD)  REQUESTING/REFERRING PHYSICIAN: Darci CurrentBrown, Baltic N, MD  CHIEF COMPLAINT:   Chief Complaint  Patient presents with  . Congestive Heart Failure  . Leg Swelling  The patient was seen and examined on 06/27/2019  HISTORY OF PRESENT ILLNESS:  Brian Oxfordhmad Corado  is a 83 y.o. [place] male with a known history of chronic systolic and diastolic CHF, who presented to the emergency room with acute onset  worsening dyspnea since this evening with associated orthopnea and paroxysmal nocturnal dyspnea as well as dry cough.  No chest pain or palpitations.  He admitted to mild lower extremity edema. No fever or chills.  No dysuria, oliguria or hematuria or flank pain.   No fever or chills.  No recent sick exposure to COVID-19.  Upon presentation to the emergency room, his blood pressure was elevated 179/92 with a pulse of 1 1 respiratory to 22 and pulse ox 90 was 80% on room air and 92% on 2 L of O2 by nasal cannula and BNP of 533 and high-sensitivity troponin I of 33 and later 37 with hypokalemia of 2.9 blood glucose of 2 4 and magnesium 1.8.  CBC showed mild anemia and INR was 1.4 with a PT of 17.3.  COVID-19 test is currently pending.  Two-view chest x-ray showed cardiomegaly with central vascular congestion and small pleural effusions with bilateral atelectasis or infiltrates.  EKG showed atrial fibrillation with controlled ventricular spots of 96 with poor R wave progression, prolonged QT interval with QTC of 507 MS.  The patient was given 40 mg of IV Lasix and 40 mEq p.o. potassium chloride.  He will be admitted to telemetry bed for further evaluation and management.  PAST MEDICAL HISTORY:   Past Medical History:  Diagnosis Date  . Arrhythmia    atrial fibrillation  . CHF (congestive  heart failure) (HCC)   . COPD (chronic obstructive pulmonary disease) (HCC)   . Enlarged prostate   . Hyperlipemia   . Hypertension   . Stroke [facility](HCC)     PAST SURGICAL HISTORY:  History reviewed. No pertinent surgical history.  SOCIAL HISTORY:   Social History   Tobacco Use  . Smoking status: Former Games developermoker  . Smokeless tobacco: Never Used  Substance Use Topics  . Alcohol use: Not Currently    FAMILY HISTORY:  History reviewed. No pertinent family history.  DRUG ALLERGIES:  No Known Allergies  REVIEW OF SYSTEMS:   ROS As per history of present illness. All pertinent systems were reviewed above. Constitutional,  HEENT, cardiovascular, respiratory, GI, GU, musculoskeletal, neuro, psychiatric, endocrine,  integumentary and hematologic systems were reviewed and are otherwise  negative/unremarkable except for positive findings mentioned above in the HPI.   MEDICATIONS AT HOME:   Prior to Admission medications   Medication Sig Start Date End Date Taking? Authorizing Provider  albuterol (VENTOLIN HFA) 108 (90 Base) MCG/ACT inhaler Inhale 1-2 puffs into the lungs every 4 (four) hours as needed.  05/27/18 05/27/19  [provider]  apixaban (ELIQUIS) 5 MG TABS tablet Take 1 tablet (5 mg total) by mouth 2 (two) times daily. 03/14/19   Alford HighlandWieting, Richard, MD  finasteride (PROSCAR) 5 MG tablet TK 1 T PO Q 24 H 12/02/18   [provider]  furosemide (LASIX) 40 MG  tablet Take 1 tablet (40 mg total) by mouth 2 (two) times daily. 03/14/19   Alford Highland, MD  lisinopril (ZESTRIL) 10 MG tablet TK 1 T PO  D 12/27/18   [provider]  metoprolol tartrate 37.5 MG TABS Take 37.5 mg by mouth 2 (two) times daily. 03/14/19   Alford Highland, MD  omeprazole (PRILOSEC) 20 MG capsule Take 20 mg by mouth daily.  12/25/18   [provider]  potassium chloride SA (K-DUR) 20 MEQ tablet Take 1 tablet (20 mEq total) by mouth 2 (two) times daily. 03/14/19   Alford Highland,  MD  pravastatin (PRAVACHOL) 40 MG tablet TK 1 T PO HS 01/04/19   [provider]  spironolactone (ALDACTONE) 25 MG tablet Take 0.5 tablets (12.5 mg total) by mouth daily. 03/14/19   Alford Highland, MD  tamsulosin (FLOMAX) 0.4 MG CAPS capsule TK ONE C PO QD 10/29/18   [provider]      VITAL SIGNS:  Blood pressure (!) 179/92, pulse (!) 101, temperature 98.3 F (36.8 C), temperature source Oral, resp. rate (!) 22, height 5\' 7"  (1.702 m), weight 58.1 kg, SpO2 92 %.  PHYSICAL EXAMINATION:  Physical Exam  GENERAL:  83 y.o.-year-old 89 male patient lying in the bed with no acute distress.  EYES: Pupils equal, round, reactive to light and accommodation. No scleral icterus. Extraocular muscles intact.  HEENT: Head atraumatic, normocephalic. Oropharynx and nasopharynx clear.  NECK:  Supple, no jugular venous distention. No thyroid enlargement, no tenderness.  LUNGS: Diminished bibasilar breath sounds with bibasilar rales. CARDIOVASCULAR: Irregularly irregular  rhythm, S1, S2 normal. No murmurs, rubs, or gallops.  ABDOMEN: Soft, nondistended, nontender. Bowel sounds present. No organomegaly or mass.  EXTREMITIES: 1+ bilateral ankle and trace bilateral leg pitting edema with no cyanosis, or clubbing.  NEUROLOGIC: Cranial nerves II through XII are intact. Muscle strength 5/5 in all extremities. Sensation intact. Gait not checked.  PSYCHIATRIC: The patient is alert and oriented x 3.  Normal affect and good eye contact. SKIN: No obvious rash, lesion, or ulcer.   LABORATORY PANEL:   CBC Recent Labs  Lab 06/27/19 1736  WBC 6.5  HGB 12.0*  HCT 38.3*  PLT 143*   ------------------------------------------------------------------------------------------------------------------  Chemistries  Recent Labs  Lab 06/27/19 1736  NA 143  K 2.9*  CL 103  CO2 28  GLUCOSE 204*  BUN 23  CREATININE 0.95  CALCIUM 9.5    ------------------------------------------------------------------------------------------------------------------  Cardiac Enzymes No results for input(s): TROPONINI in the last 168 hours. ------------------------------------------------------------------------------------------------------------------  RADIOLOGY:  Dg Chest 2 View  Result Date: 06/27/2019 CLINICAL DATA:  Shortness of breath EXAM: CHEST - 2 VIEW COMPARISON:  03/13/2019 FINDINGS: Small bilateral pleural effusions and basilar airspace disease. Cardiomegaly with central vascular congestion. Aortic atherosclerosis. No pneumothorax. IMPRESSION: Cardiomegaly with central vascular congestion and small pleural effusions. Basilar atelectasis or infiltrates. Electronically Signed   By: 03/15/2019 M.D.   On: 06/27/2019 18:02      IMPRESSION AND PLAN:   1.  Acute on chronic systolic and diastolic CHF with subsequent acute hypoxic respiratory failure The patient will be admitted to a telemetry bed and will be diuresed with IV Lasix. Will follow serial cardiac enzymes. Will obtain a cardiology consultation in a.m. by Dr. 13/12/2018 who was notified about the consult.  The patient's last 2D echo on 01/25/2019 revealed an EF of 35 to 40% with left atrial severe dilatation, moderate right atrial dilatation and mild to moderate mitral valve regurgitation and tricuspid regurgitation.  At  that time diastolic parameters could not be determined.  I will not repeat an echo at this time.  O2 protocol will be followed.  We will replace the patient hypokalemia and optimize magnesium level.  2.  Hypertensive urgency.  This is likely the culprit for #1.  The patient will be placed on PRN IV labetalol.   We will continue lisinopril and Lopressor.  3.  Chronic atrial fibrillation with controlled ventricular response.  We will continue Lopressor and Eliquis.  4.  GERD.  PPI therapy will be resumed.  5.  Dyslipidemia.  His statin therapy will be  resumed.  6.  BPH.  His Flomax and Proscar will be continued.  7.  DVT prophylaxis.  He will be continued on Eliquis.     All the records are reviewed and case discussed with ED provider. The plan of care was discussed in details with the patient (and family). I answered all questions. The patient agreed to proceed with the above mentioned plan. Further management will depend upon hospital course.   CODE STATUS: Full code  TOTAL TIME TAKING CARE OF THIS PATIENT: 55 minutes.    Christel Mormon M.D on 06/27/2019 at 9:59 PM  [facility]   From 7 PM-7 AM, contact night-coverage [email or web address]  CC: Primary care physician; [place]   Note: This dictation was prepared with Dragon dictation along with smaller phrase technology. Any transcriptional errors that result from this process are unintentional.

## 2019-06-27 NOTE — ED Provider Notes (Signed)
[facility] Emergency Department Provider Note  ____________________________________________   First MD Initiated Contact with Patient 06/27/19 2045     (approximate)  I have reviewed the triage vital signs and the nursing notes.   HISTORY  Chief Complaint Congestive Heart Failure and Leg Swelling    HPI Brian Baker is a 83 y.o. male with below list of previous medical conditions including congestive heart failure presents to the emergency department secondary to 1 day history of progressive dyspnea, orthopnea.  Patient son states that he has noted "abnormal lung sounds on the patient over the course of the day with apparent respiratory distress".  Patient son unsure if there is been a change in the patient's weight as he states that his mother monitors this.  Patient denies any chest pain.  Patient does admit to increased lower extremity swelling.  States that his father is prescribed oxygen only for sleeping at night.  On arrival to the emergency department the patient's oxygen saturation 80%.  Patient also admits to a cough.        Past Medical History:  Diagnosis Date  . Arrhythmia    atrial fibrillation  . CHF (congestive heart failure) (HCC)   . COPD (chronic obstructive pulmonary disease) (HCC)   . Enlarged prostate   . Hyperlipemia   . Hypertension   . Stroke [facility])     Patient Active Problem List   Diagnosis Date Noted  . Acute on chronic combined systolic and diastolic CHF (congestive heart failure) (HCC)   . Permanent atrial fibrillation (HCC)   . Chronic systolic heart failure (HCC) 02/05/2019  . HTN (hypertension) 02/05/2019  . Acute CHF (congestive heart failure) (HCC) 01/24/2019    History reviewed. No pertinent surgical history.  Prior to Admission medications   Medication Sig Start Date End Date Taking? Authorizing Provider  albuterol (VENTOLIN HFA) 108 (90 Base) MCG/ACT inhaler Inhale 1-2 puffs into the lungs every 4 (four)  hours as needed.  05/27/18 05/27/19  [provider]  apixaban (ELIQUIS) 5 MG TABS tablet Take 1 tablet (5 mg total) by mouth 2 (two) times daily. 03/14/19   Alford Highland, MD  finasteride (PROSCAR) 5 MG tablet TK 1 T PO Q 24 H 12/02/18   [provider]  furosemide (LASIX) 40 MG tablet Take 1 tablet (40 mg total) by mouth 2 (two) times daily. 03/14/19   Alford Highland, MD  lisinopril (ZESTRIL) 10 MG tablet TK 1 T PO  D 12/27/18   [provider]  metoprolol tartrate 37.5 MG TABS Take 37.5 mg by mouth 2 (two) times daily. 03/14/19   Alford Highland, MD  omeprazole (PRILOSEC) 20 MG capsule Take 20 mg by mouth daily.  12/25/18   [provider]  potassium chloride SA (K-DUR) 20 MEQ tablet Take 1 tablet (20 mEq total) by mouth 2 (two) times daily. 03/14/19   Alford Highland, MD  pravastatin (PRAVACHOL) 40 MG tablet TK 1 T PO HS 01/04/19   [provider]  spironolactone (ALDACTONE) 25 MG tablet Take 0.5 tablets (12.5 mg total) by mouth daily. 03/14/19   Alford Highland, MD  tamsulosin (FLOMAX) 0.4 MG CAPS capsule TK ONE C PO QD 10/29/18   [provider]    Allergies Patient has no known allergies.  History reviewed. No pertinent family history.  Social History Social History   Tobacco Use  . Smoking status: Former Games developer  . Smokeless tobacco: Never Used  Substance Use Topics  . Alcohol use: Not Currently  .  Drug use: Never    Review of Systems Constitutional: No fever/chills Eyes: No visual changes. ENT: No sore throat. Cardiovascular: Denies chest pain. Respiratory: Positive for dyspnea and orthopnea Gastrointestinal: No abdominal pain.  No nausea, no vomiting.  No diarrhea.  No constipation. Genitourinary: Negative for dysuria. Musculoskeletal: Negative for neck pain.  Negative for back pain. Integumentary: Negative for rash. Neurological: Negative for headaches, focal weakness or numbness.    ____________________________________________   PHYSICAL EXAM:  VITAL SIGNS: ED Triage Vitals  Enc Vitals Group     BP 06/27/19 1725 (!) 179/92     Pulse Rate 06/27/19 1725 (!) 101     Resp 06/27/19 1725 (!) 22     Temp 06/27/19 1725 98.3 F (36.8 C)     Temp Source 06/27/19 1725 Oral     SpO2 06/27/19 1723 (!) 80 %     Weight 06/27/19 1726 58.1 kg (128 lb)     Height 06/27/19 1726 1.702 m (5\' 7" )     Head Circumference --      Peak Flow --      Pain Score 06/27/19 1725 0     Pain Loc --      Pain Edu? --      Excl. in [place]? --     Constitutional: Alert and oriented.  Apparent respiratory difficulty Eyes: Conjunctivae are normal.  Mouth/Throat: Patient is wearing a mask. Neck: No stridor.  No meningeal signs.   Cardiovascular: Normal rate, regular rhythm. Good peripheral circulation. Grossly normal heart sounds. Respiratory: Apnea with bibasilar Rales Gastrointestinal: Soft and nontender. No distention.  Musculoskeletal: No lower extremity tenderness nor edema. No gross deformities of extremities. Neurologic:  Normal speech and language. No gross focal neurologic deficits are appreciated.  Skin:  Skin is warm, dry and intact. Psychiatric: Mood and affect are normal. Speech and behavior are normal.  ____________________________________________   LABS (all labs ordered are listed, but only abnormal results are displayed)  Labs Reviewed  BASIC METABOLIC PANEL - Abnormal; Notable for the following components:      Result Value   Potassium 2.9 (*)    Glucose, Bld 204 (*)    All other components within normal limits  CBC - Abnormal; Notable for the following components:   Hemoglobin 12.0 (*)    HCT 38.3 (*)    MCH 25.6 (*)    Platelets 143 (*)    All other components within normal limits  PROTIME-INR - Abnormal; Notable for the following components:   Prothrombin Time 17.3 (*)    INR 1.4 (*)    All other components within normal limits  TROPONIN I (HIGH SENSITIVITY)  - Abnormal; Notable for the following components:   Troponin I (High Sensitivity) 33 (*)    All other components within normal limits  TROPONIN I (HIGH SENSITIVITY)   ____________________________________________  EKG  ED ECG REPORT I, [place] N BROWN, the attending physician, personally viewed and interpreted this ECG.   Date: 06/27/2019  EKG Time: 5:39 PM  Rate: 96  Rhythm: Atrial fibrillation  Axis: Normal  Intervals: Irregular RR interval  ST&T Change: None  ____________________________________________  RADIOLOGY I, [place] N BROWN, personally viewed and evaluated these images (plain radiographs) as part of my medical decision making, as well as reviewing the written report by the radiologist.  ED MD interpretation: Cardiomegaly with central vascular congestion and small pleural effusions.  Bibasilar atelectasis or infiltrate chest x-ray per expiration per radiologist.  Official radiology report(s): Dg Chest 2 View  Result  Date: 06/27/2019 CLINICAL DATA:  Shortness of breath EXAM: CHEST - 2 VIEW COMPARISON:  03/13/2019 FINDINGS: Small bilateral pleural effusions and basilar airspace disease. Cardiomegaly with central vascular congestion. Aortic atherosclerosis. No pneumothorax. IMPRESSION: Cardiomegaly with central vascular congestion and small pleural effusions. Basilar atelectasis or infiltrates. Electronically Signed   By: Jasmine PangKim  Fujinaga M.D.   On: 06/27/2019 18:02    ____________________  Procedures   ____________________________________________   INITIAL IMPRESSION / MDM / ASSESSMENT AND PLAN / ED COURSE  As part of my medical decision making, I reviewed the following data within the electronic MEDICAL RECORD NUMBER   83 year old male presenting with above-stated history and physical exam secondary to dyspnea most likely due to CHF exacerbation with hypoxia.  Patient given Lasix 40 mg IV.  Patient discussed with Dr. Arville CareMansy for hospital admission further evaluation and  management.      ____________________________________________  FINAL CLINICAL IMPRESSION(S) / ED DIAGNOSES  Final diagnoses:  Acute on chronic congestive heart failure, unspecified heart failure type (HCC)     MEDICATIONS GIVEN DURING THIS VISIT:  Medications - No data to display   ED Discharge Orders    None      *Please note:  Brian Baker was evaluated in Emergency Department on 06/27/2019 for the symptoms described in the history of present illness. He was evaluated in the context of the global COVID-19 pandemic, which necessitated consideration that the patient might be at risk for infection with the SARS-CoV-2 virus that causes COVID-19. Institutional protocols and algorithms that pertain to the evaluation of patients at risk for COVID-19 are in a state of rapid change based on information released by regulatory bodies including the CDC and federal and state organizations. These policies and algorithms were followed during the patient's care in the ED.  Some ED evaluations and interventions may be delayed as a result of limited staffing during the pandemic.*  Note:  This document was prepared using Dragon voice recognition software and may include unintentional dictation errors.   Darci CurrentBrown, Sherwood N, MD 06/27/19 2201

## 2019-06-27 NOTE — ED Triage Notes (Signed)
Patient brought in by son who he lives with for fluid on his lungs. Symptoms of leg swelling and reports abnormal lung sounds when sleeping. Patient speaks arabic. HX stroke that also affected speech. Son is able to translate for him.

## 2019-06-28 ENCOUNTER — Inpatient Hospital Stay (HOSPITAL_COMMUNITY)
Admit: 2019-06-28 | Discharge: 2019-06-28 | Disposition: A | Discharge status: Home / Self Care | Payer: Medicaid Other | Attending: Physician Assistant | Admitting: Physician Assistant

## 2019-06-28 DIAGNOSIS — R778 Other specified abnormalities of plasma proteins: Secondary | ICD-10-CM

## 2019-06-28 DIAGNOSIS — I340 Nonrheumatic mitral (valve) insufficiency: Secondary | ICD-10-CM

## 2019-06-28 DIAGNOSIS — I5043 Acute on chronic combined systolic (congestive) and diastolic (congestive) heart failure: Secondary | ICD-10-CM

## 2019-06-28 DIAGNOSIS — E876 Hypokalemia: Secondary | ICD-10-CM

## 2019-06-28 DIAGNOSIS — E785 Hyperlipidemia, unspecified: Secondary | ICD-10-CM

## 2019-06-28 DIAGNOSIS — I361 Nonrheumatic tricuspid (valve) insufficiency: Secondary | ICD-10-CM

## 2019-06-28 DIAGNOSIS — J9601 Acute respiratory failure with hypoxia: Secondary | ICD-10-CM

## 2019-06-28 DIAGNOSIS — I4821 Permanent atrial fibrillation: Secondary | ICD-10-CM

## 2019-06-28 DIAGNOSIS — K219 Gastro-esophageal reflux disease without esophagitis: Secondary | ICD-10-CM | POA: Diagnosis present

## 2019-06-28 DIAGNOSIS — I639 Cerebral infarction, unspecified: Secondary | ICD-10-CM | POA: Diagnosis present

## 2019-06-28 DIAGNOSIS — N400 Enlarged prostate without lower urinary tract symptoms: Secondary | ICD-10-CM | POA: Diagnosis present

## 2019-06-28 DIAGNOSIS — J9621 Acute and chronic respiratory failure with hypoxia: Secondary | ICD-10-CM | POA: Diagnosis present

## 2019-06-28 LAB — ECHOCARDIOGRAM COMPLETE
Height: 67 in
Weight: 2259.2 oz

## 2019-06-28 LAB — CBC WITH DIFFERENTIAL/PLATELET
Abs Immature Granulocytes: 0.05 10*3/uL (ref 0.00–0.07)
Basophils Absolute: 0 10*3/uL (ref 0.0–0.1)
Basophils Relative: 0 %
Eosinophils Absolute: 0.2 10*3/uL (ref 0.0–0.5)
Eosinophils Relative: 3 %
HCT: 37.7 % — ABNORMAL LOW (ref 39.0–52.0)
Hemoglobin: 11.6 g/dL — ABNORMAL LOW (ref 13.0–17.0)
Immature Granulocytes: 1 %
Lymphocytes Relative: 10 %
Lymphs Abs: 0.9 10*3/uL (ref 0.7–4.0)
MCH: 25.3 pg — ABNORMAL LOW (ref 26.0–34.0)
MCHC: 30.8 g/dL (ref 30.0–36.0)
MCV: 82.3 fL (ref 80.0–100.0)
Monocytes Absolute: 0.8 10*3/uL (ref 0.1–1.0)
Monocytes Relative: 9 %
Neutro Abs: 6.6 10*3/uL (ref 1.7–7.7)
Neutrophils Relative %: 77 %
Platelets: 148 10*3/uL — ABNORMAL LOW (ref 150–400)
RBC: 4.58 MIL/uL (ref 4.22–5.81)
RDW: 14.8 % (ref 11.5–15.5)
WBC: 8.5 10*3/uL (ref 4.0–10.5)
nRBC: 0 % (ref 0.0–0.2)

## 2019-06-28 LAB — BASIC METABOLIC PANEL
Anion gap: 11 (ref 5–15)
BUN: 21 mg/dL (ref 8–23)
CO2: 29 mmol/L (ref 22–32)
Calcium: 9.3 mg/dL (ref 8.9–10.3)
Chloride: 102 mmol/L (ref 98–111)
Creatinine, Ser: 0.98 mg/dL (ref 0.61–1.24)
GFR calc Af Amer: >60 mL/min (ref >60)
GFR calc non Af Amer: >60 mL/min (ref >60)
Glucose, Bld: 146 mg/dL — ABNORMAL HIGH (ref 70–99)
Potassium: 3.1 mmol/L — ABNORMAL LOW (ref 3.5–5.1)
Sodium: 142 mmol/L (ref 135–145)

## 2019-06-28 LAB — TROPONIN I (HIGH SENSITIVITY)
Troponin I (High Sensitivity): 37 ng/L — ABNORMAL HIGH (ref <18)
Troponin I (High Sensitivity): 38 ng/L — ABNORMAL HIGH (ref <18)
Troponin I (High Sensitivity): 35 ng/L — ABNORMAL HIGH (ref <18)

## 2019-06-28 LAB — SARS CORONAVIRUS 2 (TAT 6-24 HRS): SARS Coronavirus 2: NEGATIVE

## 2019-06-28 MED ORDER — METOPROLOL TARTRATE 50 MG PO TABS
50.0000 mg | ORAL_TABLET | Freq: Two times a day (BID) | ORAL | Status: DC
  Administered 2019-06-28 – 2019-06-30 (×4): 50 mg via ORAL
  Filled 2019-06-28 (×4): qty 1

## 2019-06-28 MED ORDER — SPIRONOLACTONE 25 MG PO TABS
25.0000 mg | ORAL_TABLET | Freq: Every day | ORAL | Status: DC
  Administered 2019-06-29 – 2019-06-30 (×2): 25 mg via ORAL
  Filled 2019-06-28 (×2): qty 1

## 2019-06-28 MED ORDER — LABETALOL HCL 5 MG/ML IV SOLN
20.0000 mg | INTRAVENOUS | Status: DC | PRN
  Administered 2019-06-28: 20 mg via INTRAVENOUS
  Filled 2019-06-28: qty 4

## 2019-06-28 MED ORDER — ALBUTEROL SULFATE (2.5 MG/3ML) 0.083% IN NEBU: 2.5000 mg | INHALATION_SOLUTION | RESPIRATORY_TRACT | Status: DC | PRN

## 2019-06-28 MED ORDER — MAGNESIUM SULFATE 2 GM/50ML IV SOLN
2.0000 g | Freq: Once | INTRAVENOUS | Status: AC
  Administered 2019-06-28: 2 g via INTRAVENOUS
  Filled 2019-06-28: qty 50

## 2019-06-28 MED ORDER — APIXABAN 5 MG PO TABS
5.0000 mg | ORAL_TABLET | Freq: Two times a day (BID) | ORAL | Status: DC
  Administered 2019-06-28 – 2019-06-30 (×4): 5 mg via ORAL
  Filled 2019-06-28 (×4): qty 1

## 2019-06-28 MED ORDER — LABETALOL HCL 5 MG/ML IV SOLN: 5.0000 mg | INTRAVENOUS | Status: DC | PRN

## 2019-06-28 MED ORDER — MAGNESIUM SULFATE IN D5W 1-5 GM/100ML-% IV SOLN
1.0000 g | Freq: Once | INTRAVENOUS | Status: DC
  Filled 2019-06-28: qty 100

## 2019-06-28 MED ORDER — PNEUMOCOCCAL VAC POLYVALENT 25 MCG/0.5ML IJ INJ: 0.5000 mL | INJECTION | INTRAMUSCULAR | Status: DC

## 2019-06-28 MED ORDER — POTASSIUM CHLORIDE 20 MEQ PO PACK
40.0000 meq | PACK | Freq: Once | ORAL | Status: AC
  Administered 2019-06-28: 40 meq via ORAL
  Filled 2019-06-28: qty 2

## 2019-06-28 NOTE — ED Notes (Signed)
ED TO INPATIENT HANDOFF REPORT  ED Nurse Name and Phone #:  Geraldine Contras [phone]  S Name/Age/Gender Brian Baker 83 y.o. male Room/Bed: ED37A/ED37A  Code Status   Code Status: Full Code  Home/SNF/Other Home Patient oriented to: self, place, time and situation Is this baseline? Yes   Triage Complete: Triage complete  Chief Complaint CHF  Triage Note Patient brought in by son who he lives with for fluid on his lungs. Symptoms of leg swelling and reports abnormal lung sounds when sleeping. Patient speaks arabic. HX stroke that also affected speech. Son is able to translate for him.    Allergies No Known Allergies  Level of Care/Admitting Diagnosis ED Disposition    ED Disposition Condition Comment   Admit  Hospital Area: [facility] [[id number]]  Level of Care: Telemetry [5]  Covid Evaluation: Asymptomatic Screening Protocol (No Symptoms)  Diagnosis: Acute on chronic combined systolic and diastolic CHF (congestive heart failure) [facility]) [[id number]]  Admitting Physician: Hannah Beat [[id number]]  Attending Physician: Hannah Beat [[id number]]  Estimated length of stay: past midnight tomorrow  Certification:: I certify this patient will need inpatient services for at least 2 midnights  PT Class (Do Not Modify): Inpatient [101]  PT Acc Code (Do Not Modify): Private [1]       B Medical/Surgery History Past Medical History:  Diagnosis Date  . Arrhythmia    atrial fibrillation  . CHF (congestive heart failure) (HCC)   . COPD (chronic obstructive pulmonary disease) (HCC)   . Enlarged prostate   . Hyperlipemia   . Hypertension   . Stroke [facility])    History reviewed. No pertinent surgical history.   A IV Location/Drains/Wounds Patient Lines/Drains/Airways Status   Active Line/Drains/Airways    Name:   Placement date:   Placement time:   Site:   Days:   Peripheral IV 03/13/19 Left Forearm   03/13/19    0002    Forearm   107   Peripheral IV 06/27/19 Left Forearm   06/27/19     1742    Forearm   1   External Urinary Catheter   03/13/19    2000    -   107          Intake/Output Last 24 hours No intake or output data in the 24 hours ending 06/28/19 0057  Labs/Imaging Results for orders placed or performed during the hospital encounter of 06/27/19 (from the past 48 hour(s))  Basic metabolic panel     Status: Abnormal   Collection Time: 06/27/19  5:36 PM  Result Value Ref Range   Sodium 143 135 - 145 mmol/L   Potassium 2.9 (L) 3.5 - 5.1 mmol/L   Chloride 103 98 - 111 mmol/L   CO2 28 22 - 32 mmol/L   Glucose, Bld 204 (H) 70 - 99 mg/dL   BUN 23 8 - 23 mg/dL   Creatinine, Ser 7.34 0.61 - 1.24 mg/dL   Calcium 9.5 8.9 - 19.3 mg/dL   GFR calc non Af Amer >60 >60 mL/min   GFR calc Af Amer >60 >60 mL/min   Anion gap 12 5 - 15    Comment: Performed at [facility], [address] [place], [place] [address]  CBC     Status: Abnormal   Collection Time: 06/27/19  5:36 PM  Result Value Ref Range   WBC 6.5 4.0 - 10.5 K/uL   RBC 4.69 4.22 - 5.81 MIL/uL   Hemoglobin 12.0 (L) 13.0 - 17.0 g/dL  HCT 38.3 (L) 39.0 - 52.0 %   MCV 81.7 80.0 - 100.0 fL   MCH 25.6 (L) 26.0 - 34.0 pg   MCHC 31.3 30.0 - 36.0 g/dL   RDW 54.014.8 98.111.5 - 19.115.5 %   Platelets 143 (L) 150 - 400 K/uL   nRBC 0.0 0.0 - 0.2 %    Comment: Performed at [facility], [address] [place], [place] [address]  Troponin I (High Sensitivity)     Status: Abnormal   Collection Time: 06/27/19  5:36 PM  Result Value Ref Range   Troponin I (High Sensitivity) 33 (H) <18 ng/L    Comment: (NOTE) Elevated high sensitivity troponin I (hsTnI) values and significant  changes across serial measurements may suggest ACS but many other  chronic and acute conditions are known to elevate hsTnI results.  Refer to the "Links" section for chest pain algorithms and additional  guidance. Performed at [facility], [address] [place], [place] [address]   Protime-INR (order if Patient is  taking Coumadin / Warfarin)     Status: Abnormal   Collection Time: 06/27/19  5:36 PM  Result Value Ref Range   Prothrombin Time 17.3 (H) 11.4 - 15.2 seconds   INR 1.4 (H) 0.8 - 1.2    Comment: (NOTE) INR goal varies based on device and disease states. Performed at [facility], [address] [place], [place] [address]   Brain natriuretic peptide     Status: Abnormal   Collection Time: 06/27/19  5:36 PM  Result Value Ref Range   B Natriuretic Peptide 533.0 (H) 0.0 - 100.0 pg/mL    Comment: Performed at [facility], [address] [place], [place] [address]  Magnesium     Status: None   Collection Time: 06/27/19  5:36 PM  Result Value Ref Range   Magnesium 1.8 1.7 - 2.4 mg/dL    Comment: Performed at [facility], [address] [place], [place] [address]   Dg Chest 2 View  Result Date: 06/27/2019 CLINICAL DATA:  Shortness of breath EXAM: CHEST - 2 VIEW COMPARISON:  03/13/2019 FINDINGS: Small bilateral pleural effusions and basilar airspace disease. Cardiomegaly with central vascular congestion. Aortic atherosclerosis. No pneumothorax. IMPRESSION: Cardiomegaly with central vascular congestion and small pleural effusions. Basilar atelectasis or infiltrates. Electronically Signed   By: Jasmine PangKim  Fujinaga M.D.   On: 06/27/2019 18:02    Pending Labs Unresulted Labs (From admission, onward)    Start     Ordered   06/28/19 0500  Basic metabolic panel  Daily,   STAT     06/27/19 2216   06/28/19 0500  CBC WITH DIFFERENTIAL  Daily,   STAT     06/27/19 2216   06/27/19 2202  SARS CORONAVIRUS 2 (TAT 6-24 HRS) Nasopharyngeal Nasopharyngeal Swab  (Asymptomatic/Tier 2 Patients Labs)  Once,   STAT    Question Answer Comment  Is this test for diagnosis or screening Screening   Symptomatic for COVID-19 as defined by CDC No   Hospitalized for COVID-19 No   Admitted to ICU for COVID-19 No   Previously tested for COVID-19 Yes   Resident in a congregate (group) care  setting No   Employed in healthcare setting No      06/27/19 2201          Vitals/Pain Today's Vitals   06/27/19 1723 06/27/19 1725 06/27/19 1726 06/27/19 1731  BP:  (!) 179/92    Pulse:  (!) 101    Resp:  Marland Kitchen(!)  22    Temp:  98.3 F (36.8 C)    TempSrc:  Oral    SpO2: (!) 80% (!) 80%  92%  Weight:   58.1 kg   Height:   5\' 7"  (1.702 m)   PainSc:  0-No pain      Isolation Precautions No active isolations  Medications Medications  lisinopril (ZESTRIL) tablet 10 mg (has no administration in time range)  metoprolol tartrate (LOPRESSOR) tablet 37.5 mg (has no administration in time range)  pravastatin (PRAVACHOL) tablet 40 mg (has no administration in time range)  spironolactone (ALDACTONE) tablet 12.5 mg (has no administration in time range)  pantoprazole (PROTONIX) EC tablet 40 mg (has no administration in time range)  finasteride (PROSCAR) tablet 5 mg (has no administration in time range)  apixaban (ELIQUIS) tablet 5 mg (has no administration in time range)  tamsulosin (FLOMAX) capsule 0.4 mg (has no administration in time range)  potassium chloride SA (KLOR-CON) CR tablet 20 mEq (has no administration in time range)  albuterol (VENTOLIN HFA) 108 (90 Base) MCG/ACT inhaler 2 puff (has no administration in time range)  sodium chloride flush (NS) 0.9 % injection 3 mL ( Intravenous Canceled Entry 06/27/19 2230)  sodium chloride flush (NS) 0.9 % injection 3 mL (has no administration in time range)  0.9 %  sodium chloride infusion (has no administration in time range)  acetaminophen (TYLENOL) tablet 650 mg (has no administration in time range)  ondansetron (ZOFRAN) injection 4 mg (has no administration in time range)  furosemide (LASIX) injection 40 mg (has no administration in time range)  aspirin EC tablet 81 mg (has no administration in time range)  zolpidem (AMBIEN) tablet 5 mg (has no administration in time range)  potassium chloride (KLOR-CON) packet 40 mEq ( Oral Canceled  Entry 06/27/19 2300)  furosemide (LASIX) injection 40 mg (40 mg Intravenous Given 06/27/19 2122)  potassium chloride (KLOR-CON) packet 40 mEq (40 mEq Oral Given 06/27/19 2146)    Mobility walks with person assist Moderate fall risk   Focused Assessments    R Recommendations: See Admitting Provider Note  Report given to:

## 2019-06-28 NOTE — Consult Note (Signed)
Cardiology Consultation:   Patient ID: Brian Baker; [id number]; August 10, 1936   Admit date: 06/27/2019 Date of Consult: 06/28/2019  [facility] Provider: [facility] Primary Cardiologist: [facility]   Patient Profile:   Brian Baker is a 83 y.o. male with a hx of CAD with reported MI prior to coming to the [place], chronic combined systolic and diastolic CHF, permanent Afib on Eliquis, pulmonary hypertension, CVA in 2018, moderate mitral regurgitation, COPD with prior tobacco abuse with a 40 pack year history quitting in 1995, HTN, and HLD who is being seen today for the evaluation of volume overload at the request of Dr. Arville Care.  History of Present Illness:   Brian Baker is a refugee from [place] whom immigrated to [place] prior to coming to the [place] in 10/2016. He is followed by Dr. Fredrich Birks with [facility] (prior [facility] and Vascular physician).   He was admitted in 11/2016 with aspiration PNA and had Afib with RVR. Echo at that time showed an EF of 40-45%, moderately dilated left atrium, mildly dilated right atrium, moderate mitral regurgitation, concentric LVH. He was treated for permanent Afib with rate control, aspiration PNA and HTN. He was readmitted in 05/2018 with acute on chronic combined systolic and diastolic CHF requiring IV diuresis. Echo during that admission showed an EF of 50%, severely dilated left atrium, mildly dilated right atrium, moderate mitral regurgitation, mildly dilated ascending aorta. He was prescribed home oxygen at discharge. He as admitted in 01/2019 with acute on chronic combined CHF. CTA head for code stroke showed likely chronic left MCA territory infarct. He underwent successful IV diuresis with improvement in symptoms. Echo during that admission showed an EF of 35-40%, mildly reduced RVSF with normal RV cavity size, PASP 62.9 mmHg, severely dilated left atrium, moderately dilated right atrium, mild to moderate MR/TR. He ended up leaving the hospital  abruptly. Ischemic evaluation was deferred to this primary cardiologist. Most recently, he was admitted in 02/2019 with acute on chronic combined systolic and diastolic CHF requiring IV diuresis with improvement in symptoms.   Called and spoke with the patient's son, Park Breed, to gather details. Patient has been compliant with his medications without missing any doses. There have been no changes to his diet. Over the past 2 days, the son noticed his father (the patient) having orthopnea with bilateral lower extremity swelling. He described a "gurgling" when his father would attempt to lay down. He is uncertain of his weight trend.   Upon the patient's arrival to [facility] they were found to have BP 179 systolic, hypoxic with O2 saturations in the 80s on room air requiring nasal cannula, and afebrile. Documented weight between 58 and 64 kg. EKG showed Afib as detailed below, CXR showed cardiomegaly with central vascular congestion and small pleural effusions along with basilar atelectasis vs infiltrates. Labs showed high sensitivity troponin 33 with a delta of 37 and subsequent value of 38, BNP 533, WBC 6.5, HGB 12.0, PLT 143, BUN/SCr 23/0.95, K+ 2.9-->3.1, magnesium 1.8. In the ED, he was given IV Lasix 40 mg. Upon admission, he was placed on IV Lasix 40 mg bid. He has received 100 mEq of KCl since arrival in the ED. Documented UOP of 450 mL for the admission to date. Currently, appears comfortable sitting at the bedside.   Past Medical History:  Diagnosis Date   Arrhythmia    atrial fibrillation   CHF (congestive heart failure) (HCC)    COPD (chronic obstructive pulmonary disease) (HCC)  Enlarged prostate    Hyperlipemia    Hypertension    Stroke [facility])     History reviewed. No pertinent surgical history.   Home Meds: Prior to Admission medications   Medication Sig Start Date End Date Taking? Authorizing Provider  albuterol (VENTOLIN HFA) 108 (90 Base) MCG/ACT inhaler Inhale 1-2 puffs into the  lungs every 4 (four) hours as needed.  05/27/18 05/27/19  [provider]  apixaban (ELIQUIS) 5 MG TABS tablet Take 1 tablet (5 mg total) by mouth 2 (two) times daily. 03/14/19   Loletha Grayer, MD  finasteride (PROSCAR) 5 MG tablet TK 1 T PO Q 24 H 12/02/18   [provider]  furosemide (LASIX) 40 MG tablet Take 1 tablet (40 mg total) by mouth 2 (two) times daily. 03/14/19   Loletha Grayer, MD  lisinopril (ZESTRIL) 10 MG tablet TK 1 T PO  D 12/27/18   [provider]  metoprolol tartrate 37.5 MG TABS Take 37.5 mg by mouth 2 (two) times daily. 03/14/19   Loletha Grayer, MD  omeprazole (PRILOSEC) 20 MG capsule Take 20 mg by mouth daily.  12/25/18   [provider]  potassium chloride SA (K-DUR) 20 MEQ tablet Take 1 tablet (20 mEq total) by mouth 2 (two) times daily. 03/14/19   Loletha Grayer, MD  pravastatin (PRAVACHOL) 40 MG tablet TK 1 T PO HS 01/04/19   [provider]  spironolactone (ALDACTONE) 25 MG tablet Take 0.5 tablets (12.5 mg total) by mouth daily. 03/14/19   Loletha Grayer, MD  tamsulosin (FLOMAX) 0.4 MG CAPS capsule TK ONE C PO QD 10/29/18   [provider]    Inpatient Medications: Scheduled Meds:  apixaban  2.5 mg Oral BID   aspirin EC  81 mg Oral Daily   finasteride  5 mg Oral Daily   furosemide  40 mg Intravenous Q12H   lisinopril  10 mg Oral Daily   metoprolol tartrate  37.5 mg Oral BID   pantoprazole  40 mg Oral Daily   [START ON 06/29/2019] pneumococcal 23 valent vaccine  0.5 mL Intramuscular Tomorrow-1000   potassium chloride  40 mEq Oral Once   pravastatin  40 mg Oral q1800   sodium chloride flush  3 mL Intravenous Q12H   spironolactone  12.5 mg Oral Daily   tamsulosin  0.4 mg Oral Daily   Continuous Infusions:  sodium chloride     PRN Meds: sodium chloride, acetaminophen, albuterol, labetalol, ondansetron (ZOFRAN) IV, sodium chloride flush, zolpidem  Allergies:  No Known Allergies  Social  History:   Social History   Socioeconomic History   Marital status: Widowed    Spouse name: Not on file   Number of children: Not on file   Years of education: Not on file   Highest education level: Not on file  Occupational History   Not on file  Social Needs   Financial resource strain: Not on file   Food insecurity    Worry: Not on file    Inability: Not on file   Transportation needs    Medical: Not on file    Non-medical: Not on file  Tobacco Use   Smoking status: Former Smoker   Smokeless tobacco: Never Used  Substance and Sexual Activity   Alcohol use: Not Currently   Drug use: Never   Sexual activity: Not on file  Lifestyle   Physical activity    Days per week: Not on file    Minutes per session: Not on file  Stress: Not on file  Relationships   Social connections    Talks on phone: Not on file    Gets together: Not on file    Attends religious service: Not on file    Active member of club or organization: Not on file    Attends meetings of clubs or organizations: Not on file    Relationship status: Not on file   Intimate partner violence    Fear of current or ex partner: Not on file    Emotionally abused: Not on file    Physically abused: Not on file    Forced sexual activity: Not on file  Other Topics Concern   Not on file  Social History Narrative   Not on file     Family History: History reviewed. No pertinent family history.  -Unable to perform secondary to language.   ROS:  Review of Systems  Unable to perform ROS: Language      Physical Exam/Data:   Vitals:   06/28/19 0207 06/28/19 0524 06/28/19 0635 06/28/19 0836  BP: (!) 173/110 (!) 164/113 128/88 (!) 157/93  Pulse: 99 92 73 75  Resp:  20  19  Temp:    98.2 F (36.8 C)  TempSrc:      SpO2: 98% 98%  95%  Weight: 64 kg     Height:        Intake/Output Summary (Last 24 hours) at 06/28/2019 1032 Last data filed at 06/28/2019 0800 Gross per 24 hour  Intake 0 ml   Output 300 ml  Net -300 ml   Filed Weights   06/27/19 1726 06/28/19 0207  Weight: 58.1 kg 64 kg   Body mass index is 22.12 kg/m.   Physical Exam: General: Elderly appearing; Well developed, well nourished, in no acute distress. Head: Normocephalic, atraumatic, sclera non-icteric, no xanthomas, nares without discharge.  Neck: Negative for carotid bruits. JVD not elevated. Lungs: Faint rales along the bilateral bases. Breathing is unlabored. Heart: Irregularly irregulat with S1 S2. II/VI systolic murmur at the apex, no rubs, or gallops appreciated. Abdomen: Soft, non-tender, non-distended with normoactive bowel sounds. No hepatomegaly. No rebound/guarding. No obvious abdominal masses. Msk:  Strength and tone appear normal for age. Extremities: No clubbing or cyanosis. No edema. Distal pedal pulses are 2+ and equal bilaterally. Neuro: Alert and oriented X 3. No facial asymmetry. No focal deficit. Moves all extremities spontaneously. Psych:  Responds to questions appropriately with a normal affect.   EKG:  The EKG was personally reviewed and demonstrates: Afib 96 bpm, prolonged QTc, poor R wave progression along the precordial leads, nonspecific st/t changes  Telemetry:  Telemetry was personally reviewed and demonstrates: Afib with ventricular rates ranging from the 90s to 130s bpm, occasional PVCs, rare ventricular couplet  Weights: Filed Weights   06/27/19 1726 06/28/19 0207  Weight: 58.1 kg 64 kg    Relevant CV Studies:  2D echo 01/25/2019: 1. The left ventricle has moderately reduced systolic function, with an ejection fraction of 35-40%. The cavity size was normal. There is moderately increased left ventricular wall thickness. Left ventricular diastolic Doppler parameters are indeterminate.Global hypokinesis, select images suggests significant inferior wall hypokinesis.  2. The right ventricle has mildly reduced systolic function. The cavity was normal. There is no increase in  right ventricular wall thickness. Right ventricular systolic pressure is severely elevated with an estimated pressure of 62.9 mmHg.  3. Left atrial size was severely dilated.  4. Right atrial size was moderately dilated.  5. Mitral valve regurgitation  is mild to moderate  6. Tricuspid valve regurgitation is mild-moderate.  Laboratory Data:  Chemistry Recent Labs  Lab 06/27/19 1736 06/28/19 0259  NA 143 142  K 2.9* 3.1*  CL 103 102  CO2 28 29  GLUCOSE 204* 146*  BUN 23 21  CREATININE 0.95 0.98  CALCIUM 9.5 9.3  GFRNONAA >60 >60  GFRAA >60 >60  ANIONGAP 12 11    No results for input(s): PROT, ALBUMIN, AST, ALT, ALKPHOS, BILITOT in the last 168 hours. Hematology Recent Labs  Lab 06/27/19 1736 06/28/19 0259  WBC 6.5 8.5  RBC 4.69 4.58  HGB 12.0* 11.6*  HCT 38.3* 37.7*  MCV 81.7 82.3  MCH 25.6* 25.3*  MCHC 31.3 30.8  RDW 14.8 14.8  PLT 143* 148*   Cardiac EnzymesNo results for input(s): TROPONINI in the last 168 hours. No results for input(s): TROPIPOC in the last 168 hours.  BNP Recent Labs  Lab 06/27/19 1736  BNP 533.0*    DDimer No results for input(s): DDIMER in the last 168 hours.  Radiology/Studies:  Dg Chest 2 View  Result Date: 06/27/2019 IMPRESSION: Cardiomegaly with central vascular congestion and small pleural effusions. Basilar atelectasis or infiltrates. Electronically Signed   By: Jasmine PangKim  Fujinaga M.D.   On: 06/27/2019 18:02    Assessment and Plan:   1. Acute on chronic combined systolic and diastolic CHF/pulmonary hypertension: -Mildly volume up -BNP is improved from prior of 721 (in 02/2019) to 533 currently -ReDs 39% with a weight 141.5 pounds this morning -Continue IV diuresis for today, reassess volume status and ReDs vest in the morning -Continue Lopressor for now as outlined below -Given his cardiomyopathy, would look to transition to Toprol XL or Coreg prior to discharge as rates allow -Continue lisinopril (if echo demonstrates an EF < 40%  could change to Entresto following washout of ACEi), this could also be done as an outpatient -Continue spironolactone -ReDs vest tomorrow (order placed) -Daily weights -Strict I/O -Patient's son indicates compliance with all medications -PTA he was taking Lasix 40 mg bid, may need to escalate this or change to torsemide prior to discharge  -His son is available by phone anytime for questions or can come in during the evening if needed   2. Permanent Afib with intermittent RVR: -Ventricular rates currently reasonably controlled in the 80s bpm, though it appears with movement he becomes tachycardic into the 120s bpm -Eliquis has been changed to 5 mg bid (PTA 2.5 mg bid, though does not meet reduced dosing criteria) -For now, continue Lopressor for rate control, though given his cardiomyopathy, would change to Toprol XL or Coreg prior to discharge or as rates consistently improve  -Will need to monitor ventricular rates as previously, metoprolol has been changed to Coreg as an outpatient secondary to bradycardia  -Will increase Lopressor to 50 mg bid, watch for bradycardia   3. Elevated troponin: -Minimally elevated and not consistent with ACS, peaking at 38 -Check echo, further recommendations regarding ischemic work up pending echo -Ischemic evaluations have previously been deferred to primary cardiologist  -On ASA pending echo -Beta blocker as above  4. Hypokalemia: -Replete to goal of 4.0 -Has received 120 mEq of KCl  5. Hypomagnesemia: -Replete to goal of 2.0 -Status post IV magnesium    For questions or updates, please contact [facility] Please consult [email or web address] for contact info under Cardiology/STEMI.   Signed, Eula Listenyan Jayne Peckenpaugh, PA-C [facility] Pager: [phone] 06/28/2019, 10:32 AM

## 2019-06-28 NOTE — Plan of Care (Signed)

## 2019-06-28 NOTE — Progress Notes (Signed)
PROGRESS NOTE    Brian Oxfordhmad Hillebrand  ZOX:[id number] DOB: 10/11/1935 DOA: 06/27/2019 [facility]    Brief Narrative:  Brian Baker  is a 83 y.o. [place] male with a known history of chronic systolic and diastolic CHF, who presented to the emergency room with acute onset  worsening dyspnea since this evening with associated orthopnea and paroxysmal nocturnal dyspnea as well as dry cough. No chest pain or palpitations.  He admitted to mild lower extremity edema.No fever or chills. No dysuria, oliguria or hematuria or flank pain.  No fever or chills. No recent sick exposure to COVID-19.  Upon presentation to the emergency room, his blood pressure was elevated 179/92 with a pulse of 1 1 respiratory to 22 and pulse ox 90 was 80% on room air and 92% on 2 L of O2 by nasal cannula and BNP of 533 and high-sensitivity troponin I of 33 and later 37 with hypokalemia of 2.9 blood glucose of 2 4 and magnesium 1.8.  CBC showed mild anemia and INR was 1.4 with a PT of 17.3.  COVID-19 test is currently pending.  Two-view chest x-ray showed cardiomegaly with central vascular congestion and small pleural effusions with bilateral atelectasis or infiltrates.  EKG showed atrial fibrillation with controlled ventricular spots of 96 with poor R wave progression, prolonged QT interval with QTC of 507 MS.  The patient was given 40 mg of IV Lasix and 40 mEq p.o. potassium chloride.  He will be admitted to telemetry bed for further evaluation and management.  Interim History: 1. consulted Card, Dr. Shea Evansunn evaluated pt.  2. In/out:  -600 3. 2D echo 11/6 showed that the left ventricle has moderately reduced systolic function, with an ejection fraction of 35-40%. Left ventricular diastolic Doppler parameters are  indeterminate.Global hypokinesis, select images suggests significant inferior wall hypokinesis.  Assessment & Plan:   Principal Problem:   Acute on chronic combined systolic and diastolic CHF  (congestive heart failure) (HCC) Active Problems:   HTN (hypertension)   Permanent atrial fibrillation (HCC)   Stroke (HCC)   Hyperlipemia   Enlarged prostate   GERD (gastroesophageal reflux disease)   Hypokalemia   Elevated troponin   Acute respiratory failure with hypoxia (HCC)   Acute respiratory failure with hypoxia due to acute on chronic combined systolic and diastolic CHF (congestive heart failure): Oxygen saturation down to 80d% on room air, which improved to 98% on 2 L nasal cannula oxygen.  (HCC): BNP 533. 2D echo 11/6 showed that the left ventricle has moderately reduced systolic function, with an ejection fraction of 35-40%. Left ventricular diastolic Doppler parameters are  indeterminate.Global hypokinesis, select images suggests significant inferior wall hypokinesis. Dr. Shea Evansunn of card was consulted.  -highly appreciate Dr. Shea Evansunn recommendations -Lasix 40 mg bid by IV -Daily weights -strict I/O's -Low salt diet -Fluid restriction -per Dr. Shea Evansunn, will continue lisinopril (if echo demonstrates an EF < 40% [facility]), this could also be done as an outpatient -Continue spironolactone -ReDs vest tomorrow (order placed) -Daily weights -Strict I/O  Permanent Afib with intermittent RVR: CHA2DS2-VASc Score is 6, needs oral anticoagulation. Patient is Eliquis at home. INR is  on admission. Heart rate is 70-100s. -Eliquis has been changed to 5 mg bid (PTA 2.5 mg bid, though does not meet reduced dosing criteria) - Per Dr. Shea Evansunn, for now continue Lopressor for rate control, though given his cardiomyopathy, would change to Toprol XL or Coreg prior to discharge or as  rates consistently improve  -Will increase Lopressor to 50 mg bid, watch for bradycardia   Elevated troponin: trop 33, 37,38, 35.  Likely due to demand ischemia secondary to CHF exacerbation. -On ASA  -Beta blocker  Hypokalemia: K 3.1 and Mg 1.8 -Replete to goal of 4.0 -Has  received 120 mEq of KCl give 1 g of IV magnesium  HTN:  -on IV lasix -Lisinopril, metoprolol  Hx of Stroke [facility]): - on ASA  Hyperlipemia: -statin  BPH: stable - Continue  Proscar  GERD (gastroesophageal reflux disease): -protonix   DVT prophylaxis: on eliquis Code Status: full Family Communication: yes, I called his son by phone. Disposition Plan: not ready to be discharged. Pt has high Emergency HF Mortality Risk Grade [facility]) score of 131.6 with 7 day mortality risk of 8.2% which is high risk. Barriers for discharge:    Consultants:   cardiology  Procedures:    Antimicrobials:     Subjective: Has some mild shortness of breath, no chest pain.  has bilateral leg edema  Objective: Vitals:   06/28/19 0524 06/28/19 0635 06/28/19 0836 06/28/19 1610  BP: (!) 164/113 128/88 (!) 157/93 134/83  Pulse: 92 73 75 82  Resp: 20  19 18   Temp:   98.2 F (36.8 C) 98.1 F (36.7 C)  TempSrc:      SpO2: 98%  95% 93%  Weight:      Height:        Intake/Output Summary (Last 24 hours) at 06/28/2019 1655 Last data filed at 06/28/2019 1341 Gross per 24 hour  Intake 0 ml  Output 700 ml  Net -700 ml   Filed Weights   06/27/19 1726 06/28/19 0207  Weight: 58.1 kg 64 kg    Examination: Physical Exam:  General: Not in acute distress HEENT: PERRL, EOMI, no scleral icterus, No JVD or bruit Cardiac: S1/S2, RRR, No murmurs, gallops or rubs Pulm: Clear to auscultation bilaterally. No rales, wheezing, rhonchi or rubs. Abd: Soft, nondistended, nontender, no rebound pain, no organomegaly, BS present Ext: 1+ pitting leg edema. 2+DP/PT pulse bilaterally Musculoskeletal: No joint deformities, erythema, or stiffness, ROM full Skin: No rashes.  Neuro: Alert and oriented X3, cranial nerves II-XII grossly intact.  Psych: Patient is not psychotic, no suicidal or hemocidal ideation.    Data Reviewed: I have personally reviewed following labs and imaging studies  CBC: Recent  Labs  Lab 06/27/19 1736 06/28/19 0259  WBC 6.5 8.5  NEUTROABS  --  6.6  HGB 12.0* 11.6*  HCT 38.3* 37.7*  MCV 81.7 82.3  PLT 143* 148*   Basic Metabolic Panel: Recent Labs  Lab 06/27/19 1736 06/28/19 0259  NA 143 142  K 2.9* 3.1*  CL 103 102  CO2 28 29  GLUCOSE 204* 146*  BUN 23 21  CREATININE 0.95 0.98  CALCIUM 9.5 9.3  MG 1.8  --    GFR: Estimated Creatinine Clearance: 53.5 mL/min (by C-G formula based on SCr of 0.98 mg/dL). Liver Function Tests: No results for input(s): AST, ALT, ALKPHOS, BILITOT, PROT, ALBUMIN in the last 168 hours. No results for input(s): LIPASE, AMYLASE in the last 168 hours. No results for input(s): AMMONIA in the last 168 hours. Coagulation Profile: Recent Labs  Lab 06/27/19 1736  INR 1.4*   Cardiac Enzymes: No results for input(s): CKTOTAL, CKMB, CKMBINDEX, TROPONINI in the last 168 hours. BNP (last 3 results) No results for input(s): PROBNP in the last 8760 hours. HbA1C: No results for input(s): HGBA1C in the last 72 hours.  CBG: No results for input(s): GLUCAP in the last 168 hours. Lipid Profile: No results for input(s): CHOL, HDL, LDLCALC, TRIG, CHOLHDL, LDLDIRECT in the last 72 hours. Thyroid Function Tests: No results for input(s): TSH, T4TOTAL, FREET4, T3FREE, THYROIDAB in the last 72 hours. Anemia Panel: No results for input(s): VITAMINB12, FOLATE, FERRITIN, TIBC, IRON, RETICCTPCT in the last 72 hours. Sepsis Labs: No results for input(s): PROCALCITON, LATICACIDVEN in the last 168 hours.  Recent Results (from the past 240 hour(s))  SARS CORONAVIRUS 2 (TAT 6-24 HRS) Nasopharyngeal Nasopharyngeal Swab     Status: None   Collection Time: 06/28/19  2:13 AM   Specimen: Nasopharyngeal Swab  Result Value Ref Range Status   SARS Coronavirus 2 NEGATIVE NEGATIVE Final    Comment: (NOTE) SARS-CoV-2 target nucleic acids are NOT DETECTED. The SARS-CoV-2 RNA is generally detectable in upper and lower respiratory specimens during the  acute phase of infection. Negative results do not preclude SARS-CoV-2 infection, do not rule out co-infections with other pathogens, and should not be used as the sole basis for treatment or other patient management decisions. Negative results must be combined with clinical observations, patient history, and epidemiological information. The expected result is Negative. Fact Sheet for Patients: SugarRoll.be Fact Sheet for Healthcare Providers: https://www.woods-mathews.com/ This test is not yet approved or cleared by the [place] FDA and  has been authorized for detection and/or diagnosis of SARS-CoV-2 by FDA under an Emergency Use Authorization (EUA). This EUA will remain  in effect (meaning this test can be used) for the duration of the COVID-19 declaration under Section 56 4(b)(1) of the Act, 21 U.S.C. section 360bbb-3(b)(1), unless the authorization is terminated or revoked sooner. Performed at [facility], [place] [address] [place], [place] [address]     Radiology Studies: Dg Chest 2 View  Result Date: 06/27/2019 CLINICAL DATA:  Shortness of breath EXAM: CHEST - 2 VIEW COMPARISON:  03/13/2019 FINDINGS: Small bilateral pleural effusions and basilar airspace disease. Cardiomegaly with central vascular congestion. Aortic atherosclerosis. No pneumothorax. IMPRESSION: Cardiomegaly with central vascular congestion and small pleural effusions. Basilar atelectasis or infiltrates. Electronically Signed   By: Donavan Foil M.D.   On: 06/27/2019 18:02        Scheduled Meds: . apixaban  5 mg Oral BID  . aspirin EC  81 mg Oral Daily  . finasteride  5 mg Oral Daily  . furosemide  40 mg Intravenous Q12H  . lisinopril  10 mg Oral Daily  . metoprolol tartrate  50 mg Oral BID  . pantoprazole  40 mg Oral Daily  . [START ON 06/29/2019] pneumococcal 23 valent vaccine  0.5 mL Intramuscular Tomorrow-1000  . potassium chloride  40 mEq Oral Once   . pravastatin  40 mg Oral q1800  . sodium chloride flush  3 mL Intravenous Q12H  . [START ON 06/29/2019] spironolactone  25 mg Oral Daily  . tamsulosin  0.4 mg Oral Daily   Continuous Infusions: . sodium chloride       LOS: 1 day    Time spent: [place], DO [facility] PAGER is on [place]  If 7PM-7AM, please contact night-coverage [email or web address] Password [email or web address] 06/28/2019, 4:55 PM

## 2019-06-29 DIAGNOSIS — N400 Enlarged prostate without lower urinary tract symptoms: Secondary | ICD-10-CM

## 2019-06-29 DIAGNOSIS — I10 Essential (primary) hypertension: Secondary | ICD-10-CM

## 2019-06-29 LAB — BASIC METABOLIC PANEL
Anion gap: 11 (ref 5–15)
BUN: 33 mg/dL — ABNORMAL HIGH (ref 8–23)
CO2: 29 mmol/L (ref 22–32)
Calcium: 9.2 mg/dL (ref 8.9–10.3)
Chloride: 102 mmol/L (ref 98–111)
Creatinine, Ser: 1.16 mg/dL (ref 0.61–1.24)
GFR calc Af Amer: >60 mL/min (ref >60)
GFR calc non Af Amer: 59 mL/min — ABNORMAL LOW (ref >60)
Glucose, Bld: 159 mg/dL — ABNORMAL HIGH (ref 70–99)
Potassium: 3.2 mmol/L — ABNORMAL LOW (ref 3.5–5.1)
Sodium: 142 mmol/L (ref 135–145)

## 2019-06-29 LAB — CBC WITH DIFFERENTIAL/PLATELET
Abs Immature Granulocytes: 0.07 10*3/uL (ref 0.00–0.07)
Basophils Absolute: 0 10*3/uL (ref 0.0–0.1)
Basophils Relative: 0 %
Eosinophils Absolute: 0.2 10*3/uL (ref 0.0–0.5)
Eosinophils Relative: 3 %
HCT: 36.9 % — ABNORMAL LOW (ref 39.0–52.0)
Hemoglobin: 11.8 g/dL — ABNORMAL LOW (ref 13.0–17.0)
Immature Granulocytes: 1 %
Lymphocytes Relative: 15 %
Lymphs Abs: 1 10*3/uL (ref 0.7–4.0)
MCH: 25.4 pg — ABNORMAL LOW (ref 26.0–34.0)
MCHC: 32 g/dL (ref 30.0–36.0)
MCV: 79.5 fL — ABNORMAL LOW (ref 80.0–100.0)
Monocytes Absolute: 0.8 10*3/uL (ref 0.1–1.0)
Monocytes Relative: 11 %
Neutro Abs: 4.9 10*3/uL (ref 1.7–7.7)
Neutrophils Relative %: 70 %
Platelets: 141 10*3/uL — ABNORMAL LOW (ref 150–400)
RBC: 4.64 MIL/uL (ref 4.22–5.81)
RDW: 15 % (ref 11.5–15.5)
WBC: 7 10*3/uL (ref 4.0–10.5)
nRBC: 0 % (ref 0.0–0.2)

## 2019-06-29 LAB — MAGNESIUM: Magnesium: 1.9 mg/dL (ref 1.7–2.4)

## 2019-06-29 MED ORDER — MAGNESIUM SULFATE IN D5W 1-5 GM/100ML-% IV SOLN
1.0000 g | Freq: Once | INTRAVENOUS | Status: AC
  Administered 2019-06-29: 1 g via INTRAVENOUS
  Filled 2019-06-29: qty 100

## 2019-06-29 MED ORDER — POTASSIUM CHLORIDE CRYS ER 20 MEQ PO TBCR
60.0000 meq | EXTENDED_RELEASE_TABLET | Freq: Once | ORAL | Status: AC
  Administered 2019-06-29: 60 meq via ORAL
  Filled 2019-06-29: qty 3

## 2019-06-29 NOTE — Evaluation (Signed)
Physical Therapy Evaluation Patient Details Name: Brian Baker MRN: [id number] DOB: 03-27-1936 Today's Date: 06/29/2019   History of Present Illness  83 year old gentleman non-English speaking, immigrant from [place], prior stroke 2018, long smoking history 40 years, coronary artery disease, reported prior MI, permanent atrial fibrillation on Eliquis, previous hospital admissions for acute on chronic diastolic and systolic CHF July 2020, presenting with worsening shortness of breath  Clinical Impression  Pt is an 83 yo male admitted for above. Pt is non [place] speaking with Arabic interpreter utilized over phone with some confusion/difficulty capturing information. Per interpreter and chart review pt lives with his wife and son, requires some assistance with ADLs and ambulates with SPC (although frequency unknown). Pt required CGA +2 for safety with transfers and ambulating to recliner in room. Pt on 3L O2 nasal cannula throughout session. Pt denied any complaints of pain. Pt presents with strength, balance, activity tolerance deficits limiting functional mobility. Pt would benefit from skilled acute PT to improve deficits. After hospital discharge recommend home health PT to improve deficits, increase independence and decrease caregiver burden and fall risk.     Follow Up Recommendations Home health PT    Equipment Recommendations  Other (comment)(TBD)    Recommendations for Other Services       Precautions / Restrictions Precautions Precautions: Fall Precaution Comments: watch O2 Restrictions Weight Bearing Restrictions: No      Mobility  Bed Mobility Overal bed mobility: Needs Assistance Bed Mobility: Supine to Sit     Supine to sit: Min assist;+2 for safety/equipment     General bed mobility comments: assist for LE advancement and trunk elevation to get EOB  Transfers Overall transfer level: Needs assistance Equipment used: 2 person hand held assist Transfers: Sit to/from  Stand Sit to Stand: Min guard         General transfer comment: min guard of 2 for safety, mild unsteadiness with transfer  Ambulation/Gait             General Gait Details: took a few steps to recliner requiring min guard +2 for safety, pt ambulating with small steps and reaching towards recliner  Stairs            Wheelchair Mobility    Modified Rankin (Stroke Patients Only)       Balance Overall balance assessment: Needs assistance Sitting-balance support: No upper extremity supported;Feet supported Sitting balance-Leahy Scale: Good Sitting balance - Comments: steady sitting EOB   Standing balance support: Bilateral upper extremity supported Standing balance-Leahy Scale: Fair Standing balance comment: HHA during sit to stand and ambulating to recliner                             Pertinent Vitals/Pain Pain Assessment: No/denies pain Faces Pain Scale: No hurt    Home Living Family/patient expects to be discharged to:: Private residence Living Arrangements: Spouse/significant other;Children Available Help at Discharge: Family Type of Home: House         Home Equipment: [employer] Mor - single point      Prior Function Level of Independence: Needs assistance   Gait / Transfers Assistance Needed: through interpreter, pt reports using SPC  ADL's / Homemaking Assistance Needed: through interpreter and chart review, pt gets help from wife for ADL if needed  Comments: through interpreter pt using SPC, through interpreter and chart review pt needs help from wife for ADLs     Hand Dominance   Dominant Hand: Right  Extremity/Trunk Assessment   Upper Extremity Assessment Upper Extremity Assessment: Defer to OT evaluation;Generalized weakness    Lower Extremity Assessment Lower Extremity Assessment: Generalized weakness       Communication   Communication: Interpreter utilized(Arabic Interpreter [phone])  Cognition Arousal/Alertness:  Awake/alert Behavior During Therapy: WFL for tasks assessed/performed Overall Cognitive Status: Within Functional Limits for tasks assessed                                        General Comments      Exercises     Assessment/Plan    PT Assessment Patient needs continued PT services  PT Problem List Decreased strength;Decreased mobility;Decreased range of motion;Decreased activity tolerance;Decreased balance;Decreased knowledge of use of DME;Decreased safety awareness;Cardiopulmonary status limiting activity       PT Treatment Interventions DME instruction;Therapeutic exercise;Gait training;Balance training;Stair training;Neuromuscular re-education;Functional mobility training;Patient/family education;Therapeutic activities    PT Goals (Current goals can be found in the Care Plan section)  Acute Rehab PT Goals Patient Stated Goal: go home PT Goal Formulation: With patient Time For Goal Achievement: 07/13/19 Potential to Achieve Goals: Good    Frequency Min 2X/week   Barriers to discharge        Co-evaluation PT/OT/SLP Co-Evaluation/Treatment: Yes Reason for Co-Treatment: For patient/therapist safety;To address functional/ADL transfers PT goals addressed during session: Mobility/safety with mobility OT goals addressed during session: ADL's and self-care       AM-PAC PT "6 Clicks" Mobility  Outcome Measure Help needed turning from your back to your side while in a flat bed without using bedrails?: A Little Help needed moving from lying on your back to sitting on the side of a flat bed without using bedrails?: A Little Help needed moving to and from a bed to a chair (including a wheelchair)?: A Little Help needed standing up from a chair using your arms (e.g., wheelchair or bedside chair)?: A Little Help needed to walk in hospital room?: A Little Help needed climbing 3-5 steps with a railing? : A Little 6 Click Score: 18    End of Session Equipment  Utilized During Treatment: Gait belt Activity Tolerance: Patient tolerated treatment well Patient left: in chair;with call bell/phone within reach;with chair alarm set Nurse Communication: Mobility status PT Visit Diagnosis: Difficulty in walking, not elsewhere classified (R26.2);Unsteadiness on feet (R26.81);Muscle weakness (generalized) (M62.81)    Time: 0454-0981 PT Time Calculation (min) (ACUTE ONLY): 13 min   Charges:   PT Evaluation $PT Eval Moderate Complexity: 1 Mod         Elyn Krogh PT, DPT 3:18 PM,06/29/19 [phone]

## 2019-06-29 NOTE — Progress Notes (Signed)
Made dr. Blaine Hamper aware patients potassium is 3.2 this am. Per md will place orders for po kdur 60mg  po once. Will continue to monitor

## 2019-06-29 NOTE — Progress Notes (Signed)
Reds vest performed. Current reading is 98

## 2019-06-29 NOTE — Progress Notes (Addendum)
PROGRESS NOTE    Brian Baker  HEN:[id number] DOB: 1936-08-01 DOA: 06/27/2019 PCP: [facility]    Brief Narrative:  Brian Baker  is a 83 y.o. [place] male with a known history of chronic systolic and diastolic CHF, who presented to the emergency room with acute onset  worsening dyspnea since this evening with associated orthopnea and paroxysmal nocturnal dyspnea as well as dry cough. No chest pain or palpitations.  He admitted to mild lower extremity edema.No fever or chills. No dysuria, oliguria or hematuria or flank pain.  No fever or chills. No recent sick exposure to COVID-19.  Upon presentation to the emergency room, his blood pressure was elevated 179/92 with a pulse of 1 1 respiratory to 22 and pulse ox 90 was 80% on room air and 92% on 2 L of O2 by nasal cannula and BNP of 533 and high-sensitivity troponin I of 33 and later 37 with hypokalemia of 2.9 blood glucose of 2 4 and magnesium 1.8.  CBC showed mild anemia and INR was 1.4 with a PT of 17.3.  COVID-19 test is currently pending.  Two-view chest x-ray showed cardiomegaly with central vascular congestion and small pleural effusions with bilateral atelectasis or infiltrates.  EKG showed atrial fibrillation with controlled ventricular spots of 96 with poor R wave progression, prolonged QT interval with QTC of 507 MS.  The patient was given 40 mg of IV Lasix and 40 mEq p.o. potassium chloride.  He will be admitted to telemetry bed for further evaluation and management.  Interim History: 1. consulted Card, Dr. Shea Evans evaluated pt.  2. In/out:  -1.2 L 3. 2D echo 11/6 showed that the left ventricle has moderately reduced systolic function, with an ejection fraction of 35-40%. Left ventricular diastolic Doppler parameters are  indeterminate.Global hypokinesis, select images suggests significant inferior wall hypokinesis.  Assessment & Plan:   Principal Problem:   Acute on chronic combined systolic and diastolic CHF  (congestive heart failure) (HCC) Active Problems:   HTN (hypertension)   Permanent atrial fibrillation (HCC)   Stroke (HCC)   Hyperlipemia   Enlarged prostate   GERD (gastroesophageal reflux disease)   Hypokalemia   Elevated troponin   Acute respiratory failure with hypoxia (HCC)   Acute respiratory failure with hypoxia due to acute on chronic combined systolic and diastolic CHF (congestive heart failure): Oxygen saturation down to 80d% on room air, which improved to 98% on 2 L nasal cannula oxygen.  (HCC): BNP 533. 2D echo 11/6 showed that the left ventricle has moderately reduced systolic function, with an ejection fraction of 35-40%. Left ventricular diastolic Doppler parameters are  indeterminate.Global hypokinesis, select images suggests significant inferior wall hypokinesis. Dr. Shea Evans of card was consulted. Pt still need 4L nasal cannula oxygen (patient only needed 2 L oxygen at night at home).  Still has shortness of breath on minimal exertion.  -highly appreciate Dr. Shea Evans recommendations -Lasix 40 mg bid by IV -Daily weights -strict I/O's -Low salt diet -Fluid restriction -per Dr. Shea Evans, will continue lisinopril (if echo demonstrates an EF < 40% could change to [facility] following washout of ACEi), this could also be done as an outpatient -Continue spironolactone -Daily weights -Strict I/O  Permanent Afib with intermittent RVR: CHA2DS2-VASc Score is 6, needs oral anticoagulation. Patient is Eliquis at home. INR is  on admission. Heart rate is 70-100s. -Eliquis has been changed to 5 mg bid (PTA 2.5 mg bid, though does not meet reduced dosing criteria) - Per Dr. Shea Evans, for  now continue Lopressor for rate control, though given his cardiomyopathy, would change to Toprol XL or Coreg prior to discharge or as rates consistently improve  - increased Lopressor to 50 mg bid, watch for bradycardia   Elevated troponin: trop 33, 37,38, 35.  Likely due to demand ischemia secondary to CHF  exacerbation. -On ASA  -Beta blocker  Hypokalemia: K 3.2 and Mg 1.9 -Replete to goal of 6.0 -Has received 120 mEq of KCl give 1 g of IV magnesium  HTN:  -on IV lasix -Lisinopril, metoprolol  Hx of Stroke [facility](HCC): - on ASA  Hyperlipemia: -statin  BPH: stable - Continue  Proscar  GERD (gastroesophageal reflux disease): -protonix   DVT prophylaxis: on eliquis Code Status: full Family Communication: yes, I called his son by phone. Disposition Plan: not ready to be discharged.  Patient is still needs 4L nasal cannula oxygen, and has oxygen desaturation on minimal exertion. Pt has high Emergency HF Mortality Risk Grade [facility](EHMRG) score of 131.6 with 7 day mortality risk of 8.2% which is high risk.  Barriers for discharge:    Consultants:   cardiology  Procedures:    Antimicrobials:     Subjective: Has some mild shortness of breath, no chest pain.  has bilateral leg edema  Objective: Vitals:   06/28/19 0836 06/28/19 1610 06/28/19 1941 06/29/19 0419  BP: (!) 157/93 134/83 122/72 (!) 151/93  Pulse: 75 82 74 82  Resp: 19 18 19 18   Temp: 98.2 F (36.8 C) 98.1 F (36.7 C) 98.2 F (36.8 C) 97.8 F (36.6 C)  TempSrc:   Oral Oral  SpO2: 95% 93% 95% 96%  Weight:    63.3 kg  Height:        Intake/Output Summary (Last 24 hours) at 06/29/2019 0735 Last data filed at 06/28/2019 2318 Gross per 24 hour  Intake 240 ml  Output 1300 ml  Net -1060 ml   Filed Weights   06/27/19 1726 06/28/19 0207 06/29/19 0419  Weight: 58.1 kg 64 kg 63.3 kg    Examination: Physical Exam:  General: Not in acute distress HEENT: PERRL, EOMI, no scleral icterus, No JVD or bruit Cardiac: S1/S2, RRR, No murmurs, gallops or rubs Pulm: Clear to auscultation bilaterally. No rales, wheezing, rhonchi or rubs. Abd: Soft, nondistended, nontender, no rebound pain, no organomegaly, BS present Ext: 1+ pitting leg edema. 2+DP/PT pulse bilaterally Musculoskeletal: No joint deformities, erythema,  or stiffness, ROM full Skin: No rashes.  Neuro: Alert and oriented X3, cranial nerves II-XII grossly intact.  Psych: Patient is not psychotic, no suicidal or hemocidal ideation.    Data Reviewed: I have personally reviewed following labs and imaging studies  CBC: Recent Labs  Lab 06/27/19 1736 06/28/19 0259 06/29/19 0544  WBC 6.5 8.5 7.0  NEUTROABS  --  6.6 4.9  HGB 12.0* 11.6* 11.8*  HCT 38.3* 37.7* 36.9*  MCV 81.7 82.3 79.5*  PLT 143* 148* 141*   Basic Metabolic Panel: Recent Labs  Lab 06/27/19 1736 06/28/19 0259 06/29/19 0544  NA 143 142 142  K 2.9* 3.1* 3.2*  CL 103 102 102  CO2 28 29 29   GLUCOSE 204* 146* 159*  BUN 23 21 33*  CREATININE 0.95 0.98 1.16  CALCIUM 9.5 9.3 9.2  MG 1.8  --  1.9   GFR: Estimated Creatinine Clearance: 44.7 mL/min (by C-G formula based on SCr of 1.16 mg/dL). Liver Function Tests: No results for input(s): AST, ALT, ALKPHOS, BILITOT, PROT, ALBUMIN in the last 168 hours. No results for input(s): LIPASE,  AMYLASE in the last 168 hours. No results for input(s): AMMONIA in the last 168 hours. Coagulation Profile: Recent Labs  Lab 06/27/19 1736  INR 1.4*   Cardiac Enzymes: No results for input(s): CKTOTAL, CKMB, CKMBINDEX, TROPONINI in the last 168 hours. BNP (last 3 results) No results for input(s): PROBNP in the last 8760 hours. HbA1C: No results for input(s): HGBA1C in the last 72 hours. CBG: No results for input(s): GLUCAP in the last 168 hours. Lipid Profile: No results for input(s): CHOL, HDL, LDLCALC, TRIG, CHOLHDL, LDLDIRECT in the last 72 hours. Thyroid Function Tests: No results for input(s): TSH, T4TOTAL, FREET4, T3FREE, THYROIDAB in the last 72 hours. Anemia Panel: No results for input(s): VITAMINB12, FOLATE, FERRITIN, TIBC, IRON, RETICCTPCT in the last 72 hours. Sepsis Labs: No results for input(s): PROCALCITON, LATICACIDVEN in the last 168 hours.  Recent Results (from the past 240 hour(s))  SARS CORONAVIRUS 2  (TAT 6-24 HRS) Nasopharyngeal Nasopharyngeal Swab     Status: None   Collection Time: 06/28/19  2:13 AM   Specimen: Nasopharyngeal Swab  Result Value Ref Range Status   SARS Coronavirus 2 NEGATIVE NEGATIVE Final    Comment: (NOTE) SARS-CoV-2 target nucleic acids are NOT DETECTED. The SARS-CoV-2 RNA is generally detectable in upper and lower respiratory specimens during the acute phase of infection. Negative results do not preclude SARS-CoV-2 infection, do not rule out co-infections with other pathogens, and should not be used as the sole basis for treatment or other patient management decisions. Negative results must be combined with clinical observations, patient history, and epidemiological information. The expected result is Negative. Fact Sheet for Patients: SugarRoll.be Fact Sheet for Healthcare Providers: https://www.woods-mathews.com/ This test is not yet approved or cleared by the [place] FDA and  has been authorized for detection and/or diagnosis of SARS-CoV-2 by FDA under an Emergency Use Authorization (EUA). This EUA will remain  in effect (meaning this test can be used) for the duration of the COVID-19 declaration under Section 56 4(b)(1) of the Act, 21 U.S.C. section 360bbb-3(b)(1), unless the authorization is terminated or revoked sooner. Performed at [facility], [place] [address] [place], [place] [address]     Radiology Studies: Dg Chest 2 View  Result Date: 06/27/2019 CLINICAL DATA:  Shortness of breath EXAM: CHEST - 2 VIEW COMPARISON:  03/13/2019 FINDINGS: Small bilateral pleural effusions and basilar airspace disease. Cardiomegaly with central vascular congestion. Aortic atherosclerosis. No pneumothorax. IMPRESSION: Cardiomegaly with central vascular congestion and small pleural effusions. Basilar atelectasis or infiltrates. Electronically Signed   By: Donavan Foil M.D.   On: 06/27/2019 18:02         Scheduled Meds: . apixaban  5 mg Oral BID  . aspirin EC  81 mg Oral Daily  . finasteride  5 mg Oral Daily  . furosemide  40 mg Intravenous Q12H  . lisinopril  10 mg Oral Daily  . metoprolol tartrate  50 mg Oral BID  . pantoprazole  40 mg Oral Daily  . pneumococcal 23 valent vaccine  0.5 mL Intramuscular Tomorrow-1000  . potassium chloride  40 mEq Oral Once  . pravastatin  40 mg Oral q1800  . sodium chloride flush  3 mL Intravenous Q12H  . spironolactone  25 mg Oral Daily  . tamsulosin  0.4 mg Oral Daily   Continuous Infusions: . sodium chloride    . magnesium sulfate bolus IVPB       LOS: 2 days    Time spent: [address],  DO [facility] PAGER is on AMION  If 7PM-7AM, please contact night-coverage [email or web address] Password [facility] 06/29/2019, 7:35 AM

## 2019-06-29 NOTE — Progress Notes (Signed)
Progress Note  Patient Name: Brian Baker Date of Encounter: 06/29/2019  Primary Cardiologist:   No primary care provider on file.   Subjective   No chest pain.  Breathing better but not at baseline.    Inpatient Medications    Scheduled Meds: . apixaban  5 mg Oral BID  . aspirin EC  81 mg Oral Daily  . finasteride  5 mg Oral Daily  . furosemide  40 mg Intravenous Q12H  . lisinopril  10 mg Oral Daily  . metoprolol tartrate  50 mg Oral BID  . pantoprazole  40 mg Oral Daily  . pneumococcal 23 valent vaccine  0.5 mL Intramuscular Tomorrow-1000  . pravastatin  40 mg Oral q1800  . sodium chloride flush  3 mL Intravenous Q12H  . spironolactone  25 mg Oral Daily  . tamsulosin  0.4 mg Oral Daily   Continuous Infusions: . sodium chloride     PRN Meds: sodium chloride, acetaminophen, albuterol, labetalol, ondansetron (ZOFRAN) IV, sodium chloride flush, zolpidem   Vital Signs    Vitals:   06/29/19 0812 06/29/19 0959 06/29/19 1055 06/29/19 1220  BP: (!) 164/99     Pulse: 89     Resp: 19     Temp:      TempSrc:      SpO2: 98% 90% 96% 95%  Weight:      Height:        Intake/Output Summary (Last 24 hours) at 06/29/2019 1233 Last data filed at 06/29/2019 0939 Gross per 24 hour  Intake 600 ml  Output 1100 ml  Net -500 ml   Filed Weights   06/27/19 1726 06/28/19 0207 06/29/19 0419  Weight: 58.1 kg 64 kg 63.3 kg    Telemetry    Atrial Fib - Personally Reviewed  ECG    NA - Personally Reviewed  Physical Exam   GEN: No acute distress.   Neck: No  JVD Cardiac: RRR, no murmurs, rubs, or gallops.  Respiratory: Clear  to auscultation bilaterally. GI: Soft, nontender, non-distended  MS: No edema; No deformity. Neuro:  Nonfocal  Psych: Normal affect   Labs    Chemistry Recent Labs  Lab 06/27/19 1736 06/28/19 0259 06/29/19 0544  NA 143 142 142  K 2.9* 3.1* 3.2*  CL 103 102 102  CO2 28 29 29   GLUCOSE 204* 146* 159*  BUN 23 21 33*  CREATININE 0.95 0.98  1.16  CALCIUM 9.5 9.3 9.2  GFRNONAA >60 >60 59*  GFRAA >60 >60 >60  ANIONGAP 12 11 11      Hematology Recent Labs  Lab 06/27/19 1736 06/28/19 0259 06/29/19 0544  WBC 6.5 8.5 7.0  RBC 4.69 4.58 4.64  HGB 12.0* 11.6* 11.8*  HCT 38.3* 37.7* 36.9*  MCV 81.7 82.3 79.5*  MCH 25.6* 25.3* 25.4*  MCHC 31.3 30.8 32.0  RDW 14.8 14.8 15.0  PLT 143* 148* 141*    Cardiac EnzymesNo results for input(s): TROPONINI in the last 168 hours. No results for input(s): TROPIPOC in the last 168 hours.   BNP Recent Labs  Lab 06/27/19 1736  BNP 533.0*     DDimer No results for input(s): DDIMER in the last 168 hours.   Radiology    Dg Chest 2 View  Result Date: 06/27/2019 CLINICAL DATA:  Shortness of breath EXAM: CHEST - 2 VIEW COMPARISON:  03/13/2019 FINDINGS: Small bilateral pleural effusions and basilar airspace disease. Cardiomegaly with central vascular congestion. Aortic atherosclerosis. No pneumothorax. IMPRESSION: Cardiomegaly with central vascular congestion and small  pleural effusions. Basilar atelectasis or infiltrates. Electronically Signed   By: Donavan Foil M.D.   On: 06/27/2019 18:02    Cardiac Studies   ECHO:    1. Left ventricular ejection fraction, by visual estimation, is 45 to 50%. The left ventricle has mildly decreased function. There is no left ventricular hypertrophy. 2. Left ventricular diastolic parameters are indeterminate. 3. The left ventricle demonstrates global hypokinesis. 4. Global right ventricle has mildly reduced systolic function.The right ventricular size is mildly enlarged. No increase in right ventricular wall thickness. 5. Left atrial size was severely dilated. 6. Right atrial size was moderately dilated. 7. Tricuspid valve regurgitation mild-moderate. 8. Moderately elevated pulmonary artery systolic pressure. 9. Rhythm is atrial fibrillation.  Patient Profile     83 y.o. male non-English speaking, immigrant from [place], prior stroke 2018,  long smoking history 40 years, coronary artery disease, reported prior MI, permanent atrial fibrillation on Eliquis, previous hospital admissions for acute on chronic diastolic and systolic CHF July 6803, who presented with worsening shortness of breath  Assessment & Plan    ACUTE ON CHRONIC SYSTOLIC AND DIASTOLIC HF:  Mildly reduced EF.    Net negative 1.3 liters .  Good UO last 24 hours.   Continue current diuresis.   PERMANENT ATRIAL FIB:  Rate controlled.  Continue Eliquis.  No change in therapy.   ELEVATED TROPONIN:    Non diagnostic flat trend.  No further work up.  No suggestion of acute ischemia.      For questions or updates, please contact [place] Please consult [email or web address] for contact info under Cardiology/STEMI.   Signed, Minus Breeding, MD  06/29/2019, 12:33 PM

## 2019-06-29 NOTE — Evaluation (Signed)
Occupational Therapy Evaluation Patient Details Name: Brian Baker MRN: [id number] DOB: March 15, 1936 Today's Date: 06/29/2019    History of Present Illness 83 year old gentleman non-English speaking, immigrant from [place], prior stroke 2018, long smoking history 40 years, coronary artery disease, reported prior MI, permanent atrial fibrillation on Eliquis, previous hospital admissions for acute on chronic diastolic and systolic CHF July 2020, presenting with worsening shortness of breath   Clinical Impression   Pt seen for OT co-evaluation with PT this date. Arabic interpreter (by phone) utilized, some difficulty capturing clear information. Per chart and pt, prior to hospital admission, pt was using [facility] (not sure about frequency) and getting help from wife at home with ADL. Pt lives wife and son.  Currently pt demonstrates impairments in strength, balance, and activity tolerance, currently on 3L O2 and requiring Min A for LB ADL, Min assist for bed mobility, and CGA +2 for functional transfers. Pt would benefit from skilled OT to address noted impairments and functional limitations (see below for any additional details) in order to maximize safety and independence while minimizing falls risk and caregiver burden.  Upon hospital discharge, recommend pt discharge with [facility] services.     Follow Up Recommendations  Home health OT    Equipment Recommendations  None recommended by OT    Recommendations for Other Services       Precautions / Restrictions Precautions Precautions: Fall Precaution Comments: watch O2 Restrictions Weight Bearing Restrictions: No      Mobility Bed Mobility Overal bed mobility: Needs Assistance Bed Mobility: Supine to Sit     Supine to sit: Min assist;+2 for safety/equipment        Transfers Overall transfer level: Needs assistance Equipment used: 2 person hand held assist Transfers: Sit to/from Stand Sit to Stand: Min guard              Balance  Overall balance assessment: Needs assistance Sitting-balance support: No upper extremity supported;Feet supported Sitting balance-Leahy Scale: Good     Standing balance support: Bilateral upper extremity supported Standing balance-Leahy Scale: Fair                             ADL either performed or assessed with clinical judgement   ADL Overall ADL's : Needs assistance/impaired                                       General ADL Comments: CGA for functional ADL transfers,  Min A for LB ADL     Vision Patient Visual Report: No change from baseline       Perception     Praxis      Pertinent Vitals/Pain Pain Assessment: Faces Faces Pain Scale: No hurt     Hand Dominance Right   Extremity/Trunk Assessment Upper Extremity Assessment Upper Extremity Assessment: Generalized weakness   Lower Extremity Assessment Lower Extremity Assessment: Generalized weakness       Communication Communication Communication: Interpreter utilized(Arabic interpreter, [phone])   Cognition Arousal/Alertness: Awake/alert Behavior During Therapy: WFL for tasks assessed/performed Overall Cognitive Status: Within Functional Limits for tasks assessed                                     General Comments       Exercises     Shoulder Instructions  Home Living Family/patient expects to be discharged to:: Private residence Living Arrangements: Spouse/significant other;Children Available Help at Discharge: Family Type of Home: [place] - single point          Prior Functioning/Environment Level of Independence: Needs assistance  Gait / Transfers Assistance Needed: through interpreter, pt reports using [place] ADL's / Homemaking Assistance Needed: through interpreter and chart review, pt gets help from wife for ADL if needed            OT Problem List: Decreased strength;Decreased  activity tolerance;Impaired balance (sitting and/or standing);Decreased knowledge of use of DME or AE      OT Treatment/Interventions: Self-care/ADL training;Therapeutic exercise;Therapeutic activities;DME and/or AE instruction;Patient/family education;Balance training    OT Goals(Current goals can be found in the care plan section) Acute Rehab OT Goals Patient Stated Goal: go home OT Goal Formulation: With patient Time For Goal Achievement: 07/13/19 Potential to Achieve Goals: Good ADL Goals Pt Will Perform Lower Body Dressing: sit to/from stand;with supervision Pt Will Transfer to Toilet: with supervision;bedside commode;ambulating(LRAD for amb)  OT Frequency: Min 1X/week   Barriers to D/C:            Co-evaluation PT/OT/SLP Co-Evaluation/Treatment: Yes Reason for Co-Treatment: For patient/therapist safety;To address functional/ADL transfers PT goals addressed during session: Mobility/safety with mobility OT goals addressed during session: ADL's and self-care      AM-PAC OT "6 Clicks" Daily Activity     Outcome Measure Help from another person eating meals?: None Help from another person taking care of personal grooming?: None Help from another person toileting, which includes using toliet, bedpan, or urinal?: A Little Help from another person bathing (including washing, rinsing, drying)?: A Little Help from another person to put on and taking off regular upper body clothing?: None Help from another person to put on and taking off regular lower body clothing?: A Little 6 Click Score: 21   End of Session Equipment Utilized During Treatment: Gait belt  Activity Tolerance: Patient tolerated treatment well Patient left: in chair;with call bell/phone within reach;with chair alarm set  OT Visit Diagnosis: Other abnormalities of gait and mobility (R26.89);Muscle weakness (generalized) (M62.81)                Time: 1914-7829 OT Time Calculation (min): 13 min Charges:  OT  General Charges $OT Visit: 1 Visit OT Evaluation $OT Eval Low Complexity: 1 Low  Jeni Salles, MPH, MS, OTR/L ascom [phone] 06/29/19, 1:05 PM

## 2019-06-30 DIAGNOSIS — K219 Gastro-esophageal reflux disease without esophagitis: Secondary | ICD-10-CM

## 2019-06-30 LAB — CBC WITH DIFFERENTIAL/PLATELET
Abs Immature Granulocytes: 0.07 10*3/uL (ref 0.00–0.07)
Basophils Absolute: 0 10*3/uL (ref 0.0–0.1)
Basophils Relative: 0 %
Eosinophils Absolute: 0.3 10*3/uL (ref 0.0–0.5)
Eosinophils Relative: 4 %
HCT: 41.6 % (ref 39.0–52.0)
Hemoglobin: 12.7 g/dL — ABNORMAL LOW (ref 13.0–17.0)
Immature Granulocytes: 1 %
Lymphocytes Relative: 18 %
Lymphs Abs: 1.2 10*3/uL (ref 0.7–4.0)
MCH: 25.6 pg — ABNORMAL LOW (ref 26.0–34.0)
MCHC: 30.5 g/dL (ref 30.0–36.0)
MCV: 83.9 fL (ref 80.0–100.0)
Monocytes Absolute: 0.6 10*3/uL (ref 0.1–1.0)
Monocytes Relative: 9 %
Neutro Abs: 4.5 10*3/uL (ref 1.7–7.7)
Neutrophils Relative %: 68 %
Platelets: 148 10*3/uL — ABNORMAL LOW (ref 150–400)
RBC: 4.96 MIL/uL (ref 4.22–5.81)
RDW: 15 % (ref 11.5–15.5)
WBC: 6.8 10*3/uL (ref 4.0–10.5)
nRBC: 0 % (ref 0.0–0.2)

## 2019-06-30 LAB — MAGNESIUM: Magnesium: 2.3 mg/dL (ref 1.7–2.4)

## 2019-06-30 LAB — BASIC METABOLIC PANEL
Anion gap: 11 (ref 5–15)
BUN: 34 mg/dL — ABNORMAL HIGH (ref 8–23)
CO2: 29 mmol/L (ref 22–32)
Calcium: 9.7 mg/dL (ref 8.9–10.3)
Chloride: 101 mmol/L (ref 98–111)
Creatinine, Ser: 1.16 mg/dL (ref 0.61–1.24)
GFR calc Af Amer: >60 mL/min (ref >60)
GFR calc non Af Amer: 59 mL/min — ABNORMAL LOW (ref >60)
Glucose, Bld: 158 mg/dL — ABNORMAL HIGH (ref 70–99)
Potassium: 3.9 mmol/L (ref 3.5–5.1)
Sodium: 141 mmol/L (ref 135–145)

## 2019-06-30 MED ORDER — LISINOPRIL 20 MG PO TABS: 20.0000 mg | ORAL_TABLET | Freq: Every day | ORAL | 1 refills | Status: DC

## 2019-06-30 MED ORDER — METOPROLOL TARTRATE 50 MG PO TABS: 50.0000 mg | ORAL_TABLET | Freq: Two times a day (BID) | ORAL | 1 refills | Status: AC

## 2019-06-30 NOTE — Progress Notes (Signed)
Discharge instructions explained to son and understood. IV d/c'd tip intact, tele removed and returned. Pt left unit via w/c accompanied by son and staff. Personal belongings in son's possession.

## 2019-06-30 NOTE — Discharge Instructions (Signed)
You were cared for by a hospitalist during your hospital stay. If you have any questions about your discharge medications or the care you received while you were in the hospital after you are discharged, you can call the unit and ask to speak with the hospitalist on call if the hospitalist that took care of you is not available. Once you are discharged, your primary care physician will handle any further medical issues. Please note that NO REFILLS for any discharge medications will be authorized once you are discharged, as it is imperative that you return to your primary care physician (or establish a relationship with a primary care physician if you do not have one) for your aftercare needs so that they can reassess your need for medications and monitor your lab values.  Follow up with PCP. Please call and follow up with your cardiologist in [facility] within 2 weeks. Take all medications as prescribed. If symptoms change or worsen please return to the ED for evaluation

## 2019-06-30 NOTE — TOC Transition Note (Signed)
Transition of Care [facility]) - CM/SW Discharge Note   Patient Details  Name: Brian Baker MRN: [id number] Date of Birth: March 22, 1936  [facility] CM/SW Contact:  Latanya Maudlin, RN Phone Number: 06/30/2019, 1:54 PM   Clinical Narrative:  Patient to be discharged per MD order. Orders in place for home health services. Patient is noted to be from home with son. Patient speaks arabic. No arabic translator available. Unable to complete full assessment. Voicemail left for son. Patient has [place], [place] address. Home health referral given to [place] at [facility]. No DME needs    Final next level of care: Home w Home Health Services Barriers to Discharge: Continued Medical Work up   Patient Goals and CMS Choice     Choice offered to / list presented to : Patient  Discharge Placement                       Discharge Plan and Services                          HH Arranged: PT, RN [facility] Agency: [place] Date [place]: 06/30/19 Time [place]: [id number] Representative spoke with at [place]: [place] Determinants of Health ([place]) Interventions     Readmission Risk Interventions No flowsheet data found.

## 2019-06-30 NOTE — Progress Notes (Signed)
Progress Note  Patient Name: Brian Baker Date of Encounter: 06/30/2019  Primary Cardiologist:   No primary care provider on file.   Subjective   He looks good today.  Resting comfortably and smiling.  No distress  Inpatient Medications    Scheduled Meds: . apixaban  5 mg Oral BID  . aspirin EC  81 mg Oral Daily  . finasteride  5 mg Oral Daily  . furosemide  40 mg Intravenous Q12H  . lisinopril  10 mg Oral Daily  . metoprolol tartrate  50 mg Oral BID  . pantoprazole  40 mg Oral Daily  . pneumococcal 23 valent vaccine  0.5 mL Intramuscular Tomorrow-1000  . pravastatin  40 mg Oral q1800  . sodium chloride flush  3 mL Intravenous Q12H  . spironolactone  25 mg Oral Daily  . tamsulosin  0.4 mg Oral Daily   Continuous Infusions: . sodium chloride     PRN Meds: sodium chloride, acetaminophen, albuterol, labetalol, ondansetron (ZOFRAN) IV, sodium chloride flush, zolpidem   Vital Signs    Vitals:   06/29/19 1516 06/29/19 1926 06/30/19 0542 06/30/19 0739  BP: 127/79 (!) 153/92 127/86 (!) 144/85  Pulse: 77 81 79 (!) 48  Resp: 19 16 16 19   Temp: 98.4 F (36.9 C) 98.7 F (37.1 C) 97.6 F (36.4 C) (!) 97.4 F (36.3 C)  TempSrc: Oral Oral Oral Oral  SpO2: 90% 95% 97% 96%  Weight:   61.9 kg   Height:        Intake/Output Summary (Last 24 hours) at 06/30/2019 0826 Last data filed at 06/30/2019 13/03/2019 Gross per 24 hour  Intake 934.05 ml  Output 1500 ml  Net -565.95 ml   Filed Weights   06/28/19 0207 06/29/19 0419 06/30/19 0542  Weight: 64 kg 63.3 kg 61.9 kg    Telemetry    Atrial fib with controlled ventricular rate - Personally Reviewed  ECG    NA - Personally Reviewed  Physical Exam   GEN: No  acute distress.   Neck: No  JVD Cardiac: RRR, no murmurs, rubs, or gallops.  Respiratory: Clear  to auscultation bilaterally. GI: Soft, nontender, non-distended, normal bowel sounds  MS:  No edema; No deformity. Neuro:   Nonfocal  Psych: Oriented and appropriate     Labs    Chemistry Recent Labs  Lab 06/28/19 0259 06/29/19 0544 06/30/19 0559  NA 142 142 141  K 3.1* 3.2* 3.9  CL 102 102 101  CO2 29 29 29   GLUCOSE 146* 159* 158*  BUN 21 33* 34*  CREATININE 0.98 1.16 1.16  CALCIUM 9.3 9.2 9.7  GFRNONAA >60 59* 59*  GFRAA >60 >60 >60  ANIONGAP 11 11 11      Hematology Recent Labs  Lab 06/28/19 0259 06/29/19 0544 06/30/19 0559  WBC 8.5 7.0 6.8  RBC 4.58 4.64 4.96  HGB 11.6* 11.8* 12.7*  HCT 37.7* 36.9* 41.6  MCV 82.3 79.5* 83.9  MCH 25.3* 25.4* 25.6*  MCHC 30.8 32.0 30.5  RDW 14.8 15.0 15.0  PLT 148* 141* 148*    Cardiac EnzymesNo results for input(s): TROPONINI in the last 168 hours. No results for input(s): TROPIPOC in the last 168 hours.   BNP Recent Labs  Lab 06/27/19 1736  BNP 533.0*     DDimer No results for input(s): DDIMER in the last 168 hours.   Radiology    No results found.  Cardiac Studies   ECHO:    1. Left ventricular ejection fraction, by visual estimation,  is 45 to 50%. The left ventricle has mildly decreased function. There is no left ventricular hypertrophy. 2. Left ventricular diastolic parameters are indeterminate. 3. The left ventricle demonstrates global hypokinesis. 4. Global right ventricle has mildly reduced systolic function.The right ventricular size is mildly enlarged. No increase in right ventricular wall thickness. 5. Left atrial size was severely dilated. 6. Right atrial size was moderately dilated. 7. Tricuspid valve regurgitation mild-moderate. 8. Moderately elevated pulmonary artery systolic pressure. 9. Rhythm is atrial fibrillation.  Patient Profile     83 y.o. male non-English speaking, immigrant from [place], prior stroke 2018, long smoking history 40 years, coronary artery disease, reported prior MI, permanent atrial fibrillation on Eliquis, previous hospital admissions for acute on chronic diastolic and systolic CHF July 8341, who presented with worsening shortness of  breath  Assessment & Plan    ACUTE ON CHRONIC SYSTOLIC AND DIASTOLIC HF:  Mildly reduced EF.    Net negative 1.8 liters .  He looks good and I think he could go home.  We have increased his beta blocker here.  I would also increase the lisinopril to 20 mg daily.  He follows with cardiology at [facility] and should have follow up in the next two weeks. I tried to call his son to discuss daily weights and PRN increased diuretic but there was no answer.   PERMANENT ATRIAL FIB:  Rate controlled.  Continue Eliquis.  No ASA at discharge.   ELEVATED TROPONIN:    Non diagnostic flat trend x 3. No further work up.    For questions or updates, please contact [place] Please consult [email or web address] for contact info under Cardiology/STEMI.   Signed, Minus Breeding, MD  06/30/2019, 8:26 AM

## 2019-06-30 NOTE — Discharge Summary (Signed)
Physician Discharge Summary  Brian Baker ZOX:[id number] DOB: 07/17/1936 DOA: 06/27/2019  [facility]  Admit date: 06/27/2019 Discharge date: 06/30/2019  Recommendations for Outpatient Follow-up:  1. Follow up with PCP in 1 weeks 2. Please obtain BMP and magnesium level in one week 3. [facility] in [facility] weeks  Home Health: HH PT and HH OT Equipment/Devices: none  Discharge Condition: stable CODE STATUS: full Diet recommendation: low salt  Brief/Interim Summary (HPI)  Brian Baker a83 y.o.[place] malewith a known history of chronic systolic and diastolic CHF, who presented to the emergency room with acute onset worsening dyspnea since this evening with associated orthopnea and paroxysmal nocturnal dyspneaas well as dry cough. No chest pain or palpitations.He admitted to mild lower extremity edema.No fever or chills. No dysuria, oliguria or hematuria or flank pain. No fever or chills. No recent sick exposureto COVID-19.  Upon presentation to the emergency room, his bloodpressurewas elevated 179/92 with a pulse of 1 1 respiratory to 22 and pulse ox 90 was 80% on room air and 92% on 2 L of O2 by nasal cannula and BNP of 533 and high-sensitivity troponin I of 33 and later 37 with hypokalemia of 2.9 blood glucose of 2 4 and magnesium 1.8. CBC showed mild anemia and INR was 1.4 with a PT of 17.3. COVID-19 test is currently pending. Two-view chest x-ray showed cardiomegaly with central vascular congestion and small pleural effusions with bilateral atelectasis or infiltrates. EKG showed atrial fibrillation with controlled ventricular spots of 96 with poor R wave progression, prolonged QT interval with QTC of 507 MS.  The patient was given 40 mg of IV Lasix and 40 mEq p.o. potassium chloride. He will be admitted to telemetry bed for further evaluation and management.   Discharge Diagnoses and Hospital Course:   Principal  Problem:   Acute on chronic combined systolic and diastolic CHF (congestive heart failure) (HCC) Active Problems:   HTN (hypertension)   Permanent atrial fibrillation (HCC)   Stroke (HCC)   Hyperlipemia   Enlarged prostate   GERD (gastroesophageal reflux disease)   Hypokalemia   Elevated troponin   Acute respiratory failure with hypoxia (HCC)  Acute respiratory failure with hypoxia due to acute on chronic combined systolic and diastolic CHF (congestive heart failure): Oxygen saturation down to 80d% on room air, which improved to 98% on 2 L nasal cannula oxygen.  (HCC): BNP 533. 2D echo 11/6 showed that the left ventricle has moderately reduced systolic function, with an ejection fraction of 35-40%. Left ventricular diastolic Doppler parameters are  indeterminate.Global hypokinesis, select images suggests significant inferior wall hypokinesis. Dr. Shea Evansunn and Dr. Antoine PocheHochrein of card were consulted. Pt was treated with IV Lasix.  Symptoms improved significantly. -continue home lasix 40 mg bid and spironolactone 25 mg daily -continue lisinopril, metoprolol -Daily weights -Low salt diet   Permanent Afibwith intermittent RVR: CHA2DS2-VASc Score is 6, needs oral anticoagulation. Patient is Eliquis at home. Heart rate is controled -Eliquis has been changed to 5 mg bid (PTA 2.5 mg bid, though does not meet reduced dosing criteria) - increased Lopressor to 50 mg bid, watch for bradycardia  Elevated troponin: trop 33, 37,38, 35.  Likely due to demand ischemia secondary to CHF exacerbation. -On ASA  Hypokalemia: resolved  -Beta blocker  HTN:  -on  lasix -Lisinopril, metoprolol  Hx of Stroke [facility](HCC): - on ASA  Hyperlipemia: -statin  BPH: stable - Continue  Proscar  GERD (gastroesophageal reflux disease): -protonix  Discharge Instructions  Discharge Instructions    Call MD for:  difficulty breathing, headache or visual disturbances   Complete by: As directed    Call MD for:   severe uncontrolled pain   Complete by: As directed    Call MD for:  temperature >100.4   Complete by: As directed    Diet - low sodium heart healthy   Complete by: As directed    Increase activity slowly   Complete by: As directed      Allergies as of 06/30/2019   No Known Allergies     Medication List    TAKE these medications   albuterol 108 (90 Base) MCG/ACT inhaler Commonly known as: VENTOLIN HFA Inhale 1-2 puffs into the lungs every 4 (four) hours as needed.   apixaban 5 MG Tabs tablet Commonly known as: Eliquis Take 1 tablet (5 mg total) by mouth 2 (two) times daily.   finasteride 5 MG tablet Commonly known as: PROSCAR Take 5 mg by mouth daily.   furosemide 40 MG tablet Commonly known as: LASIX Take 1 tablet (40 mg total) by mouth 2 (two) times daily.   lisinopril 20 MG tablet Commonly known as: ZESTRIL Take 1 tablet (20 mg total) by mouth daily. What changed:   medication strength  how much to take   metoprolol tartrate 50 MG tablet Commonly known as: LOPRESSOR Take 1 tablet (50 mg total) by mouth 2 (two) times daily. What changed:   medication strength  how much to take   omeprazole 20 MG capsule Commonly known as: PRILOSEC Take 20 mg by mouth daily.   pravastatin 40 MG tablet Commonly known as: PRAVACHOL TK 1 T PO HS   spironolactone 25 MG tablet Commonly known as: ALDACTONE Take 0.5 tablets (12.5 mg total) by mouth daily.   tamsulosin 0.4 MG Caps capsule Commonly known as: FLOMAX Take 0.4 mg by mouth daily.      Follow-up Information    [facility] Follow [facility] in 1 week(s).   Contact information: [address] [place] [address] [phone]          No Known Allergies  Consultations:  card   Procedures/Studies: Dg Chest 2 View  Result Date: 06/27/2019 CLINICAL DATA:  Shortness of breath EXAM: CHEST - 2 VIEW COMPARISON:  03/13/2019 FINDINGS: Small bilateral pleural effusions and basilar airspace disease.  Cardiomegaly with central vascular congestion. Aortic atherosclerosis. No pneumothorax. IMPRESSION: Cardiomegaly with central vascular congestion and small pleural effusions. Basilar atelectasis or infiltrates. Electronically Signed   By: Jasmine Pang M.D.   On: 06/27/2019 18:02     Subjective: Feels much better, no CP, has mild SOB. No fever or chill  Discharge Exam: Vitals:   06/30/19 0739 06/30/19 1025  BP: (!) 144/85   Pulse: (!) 48 85  Resp: 19   Temp: (!) 97.4 F (36.3 C)   SpO2: 96%    Vitals:   06/29/19 1926 06/30/19 0542 06/30/19 0739 06/30/19 1025  BP: (!) 153/92 127/86 (!) 144/85   Pulse: 81 79 (!) 48 85  Resp: 16 16 19    Temp: 98.7 F (37.1 C) 97.6 F (36.4 C) (!) 97.4 F (36.3 C)   TempSrc: Oral Oral Oral   SpO2: 95% 97% 96%   Weight:  61.9 kg    Height:        General: Pt is alert, awake, not in acute distress Cardiovascular: RRR, S1/S2 +, no rubs, no gallops Respiratory: CTA bilaterally, no wheezing, no rhonchi  Abdominal: Soft, NT, ND, bowel sounds + Extremities: no edema, no cyanosis    The results of significant diagnostics from this hospitalization (including imaging, microbiology, ancillary and laboratory) are listed below for reference.     Microbiology: Recent Results (from the past 240 hour(s))  SARS CORONAVIRUS 2 (TAT 6-24 HRS) Nasopharyngeal Nasopharyngeal Swab     Status: None   Collection Time: 06/28/19  2:13 AM   Specimen: Nasopharyngeal Swab  Result Value Ref Range Status   SARS Coronavirus 2 NEGATIVE NEGATIVE Final    Comment: (NOTE) SARS-CoV-2 target nucleic acids are NOT DETECTED. The SARS-CoV-2 RNA is generally detectable in upper and lower respiratory specimens during the acute phase of infection. Negative results do not preclude SARS-CoV-2 infection, do not rule out co-infections with other pathogens, and should not be used as the sole basis for treatment or other patient management decisions. Negative results must be  combined with clinical observations, patient history, and epidemiological information. The expected result is Negative. Fact Sheet for Patients: SugarRoll.be Fact Sheet for Healthcare Providers: https://www.woods-mathews.com/ This test is not yet approved or cleared by the [place] FDA and  has been authorized for detection and/or diagnosis of SARS-CoV-2 by FDA under an Emergency Use Authorization (EUA). This EUA will remain  in effect (meaning this test can be used) for the duration of the COVID-19 declaration under Section 56 4(b)(1) of the Act, 21 U.S.C. section 360bbb-3(b)(1), unless the authorization is terminated or revoked sooner. Performed at [facility], [place] [address] [place], [place] [address]      Labs: BNP (last 3 results) Recent Labs    03/12/19 2239 03/13/19 0443 06/27/19 1736  BNP 611.0* 721.0* 696.7*   Basic Metabolic Panel: Recent Labs  Lab 06/27/19 1736 06/28/19 0259 06/29/19 0544 06/30/19 0559  NA 143 142 142 141  K 2.9* 3.1* 3.2* 3.9  CL 103 102 102 101  CO2 28 29 29 29   GLUCOSE 204* 146* 159* 158*  BUN 23 21 33* 34*  CREATININE 0.95 0.98 1.16 1.16  CALCIUM 9.5 9.3 9.2 9.7  MG 1.8  --  1.9 2.3   Liver Function Tests: No results for input(s): AST, ALT, ALKPHOS, BILITOT, PROT, ALBUMIN in the last 168 hours. No results for input(s): LIPASE, AMYLASE in the last 168 hours. No results for input(s): AMMONIA in the last 168 hours. CBC: Recent Labs  Lab 06/27/19 1736 06/28/19 0259 06/29/19 0544 06/30/19 0559  WBC 6.5 8.5 7.0 6.8  NEUTROABS  --  6.6 4.9 4.5  HGB 12.0* 11.6* 11.8* 12.7*  HCT 38.3* 37.7* 36.9* 41.6  MCV 81.7 82.3 79.5* 83.9  PLT 143* 148* 141* 148*   Cardiac Enzymes: No results for input(s): CKTOTAL, CKMB, CKMBINDEX, TROPONINI in the last 168 hours. BNP: Invalid input(s): POCBNP CBG: No results for input(s): GLUCAP in the last 168 hours. D-Dimer No results for  input(s): DDIMER in the last 72 hours. Hgb A1c No results for input(s): HGBA1C in the last 72 hours. Lipid Profile No results for input(s): CHOL, HDL, LDLCALC, TRIG, CHOLHDL, LDLDIRECT in the last 72 hours. Thyroid function studies No results for input(s): TSH, T4TOTAL, T3FREE, THYROIDAB in the last 72 hours.  Invalid input(s): FREET3 Anemia work up No results for input(s): VITAMINB12, FOLATE, FERRITIN, TIBC, IRON, RETICCTPCT in the last 72 hours. Urinalysis    Component Value Date/Time   COLORURINE YELLOW (A) 03/12/2019 2239   APPEARANCEUR CLEAR (A) 03/12/2019 2239   LABSPEC 1.010 03/12/2019 2239   PHURINE 7.0 03/12/2019 2239  GLUCOSEU NEGATIVE 03/12/2019 2239   HGBUR SMALL (A) 03/12/2019 2239   BILIRUBINUR NEGATIVE 03/12/2019 2239   KETONESUR NEGATIVE 03/12/2019 2239   PROTEINUR 30 (A) 03/12/2019 2239   NITRITE NEGATIVE 03/12/2019 2239   LEUKOCYTESUR LARGE (A) 03/12/2019 2239   Sepsis Labs Invalid input(s): PROCALCITONIN,  WBC,  LACTICIDVEN Microbiology Recent Results (from the past 240 hour(s))  SARS CORONAVIRUS 2 (TAT 6-24 HRS) Nasopharyngeal Nasopharyngeal Swab     Status: None   Collection Time: 06/28/19  2:13 AM   Specimen: Nasopharyngeal Swab  Result Value Ref Range Status   SARS Coronavirus 2 NEGATIVE NEGATIVE Final    Comment: (NOTE) SARS-CoV-2 target nucleic acids are NOT DETECTED. The SARS-CoV-2 RNA is generally detectable in upper and lower respiratory specimens during the acute phase of infection. Negative results do not preclude SARS-CoV-2 infection, do not rule out co-infections with other pathogens, and should not be used as the sole basis for treatment or other patient management decisions. Negative results must be combined with clinical observations, patient history, and epidemiological information. The expected result is Negative. Fact Sheet for Patients: HairSlick.no Fact Sheet for Healthcare  Providers: quierodirigir.com This test is not yet approved or cleared by the [place] FDA and  has been authorized for detection and/or diagnosis of SARS-CoV-2 by FDA under an Emergency Use Authorization (EUA). This EUA will remain  in effect (meaning this test can be used) for the duration of the COVID-19 declaration under Section 56 4(b)(1) of the Act, 21 U.S.C. section 360bbb-3(b)(1), unless the authorization is terminated or revoked sooner. Performed at [facility], [address], [place], [place] [address]     Time coordinating discharge:  35 minutes  SIGNED:  Lorretta Harp, DO [facility] 06/30/2019, 1:19 PM Pager is on AMION  If 7PM-7AM, please contact night-coverage [email or web address] Password [email or web address]

## 2019-07-06 ENCOUNTER — Other Ambulatory Visit: Payer: Self-pay

## 2019-07-06 ENCOUNTER — Emergency Department: Payer: Medicaid Other

## 2019-07-06 ENCOUNTER — Emergency Department
Admission: EM | Admit: 2019-07-06 | Discharge: 2019-07-06 | Disposition: A | Discharge status: Home / Self Care | Payer: Medicaid Other | Attending: Emergency Medicine | Admitting: Emergency Medicine

## 2019-07-06 DIAGNOSIS — Z87891 Personal history of nicotine dependence: Secondary | ICD-10-CM | POA: Diagnosis not present

## 2019-07-06 DIAGNOSIS — J449 Chronic obstructive pulmonary disease, unspecified: Secondary | ICD-10-CM | POA: Diagnosis not present

## 2019-07-06 DIAGNOSIS — Z8673 Personal history of transient ischemic attack (TIA), and cerebral infarction without residual deficits: Secondary | ICD-10-CM | POA: Diagnosis not present

## 2019-07-06 DIAGNOSIS — I5042 Chronic combined systolic (congestive) and diastolic (congestive) heart failure: Secondary | ICD-10-CM | POA: Diagnosis not present

## 2019-07-06 DIAGNOSIS — Z7901 Long term (current) use of anticoagulants: Secondary | ICD-10-CM | POA: Insufficient documentation

## 2019-07-06 DIAGNOSIS — Z79899 Other long term (current) drug therapy: Secondary | ICD-10-CM | POA: Insufficient documentation

## 2019-07-06 DIAGNOSIS — M79602 Pain in left arm: Secondary | ICD-10-CM | POA: Diagnosis present

## 2019-07-06 DIAGNOSIS — I110 Hypertensive heart disease with heart failure: Secondary | ICD-10-CM | POA: Diagnosis not present

## 2019-07-06 DIAGNOSIS — I82612 Acute embolism and thrombosis of superficial veins of left upper extremity: Secondary | ICD-10-CM | POA: Diagnosis not present

## 2019-07-06 NOTE — ED Notes (Signed)
Assessment: pt with recent admission to hospital and iv in left forearm. Pt with area of swelling and pain noted around where previous iv was placed to mid anterior left forearm. 2+ radial pulse palpated to left arm.

## 2019-07-06 NOTE — ED Provider Notes (Signed)
[facility] Emergency Department Provider Note _______________   First MD Initiated Contact with Patient 07/06/19 [phone]     (approximate)  I have reviewed the triage vital signs and the nursing notes.   HISTORY  Chief Complaint Arm Pain   HPI Brian Baker is a 83 y.o. male with below list of previous medical conditions including COPD, CHF with recent hospitalization secondary to CHF on 06/27/2019 returns to the emergency department secondary to left forearm pain and swelling x1 day at previous IV site from the patient's last hospitalization.  Patient denies any chest pain or shortness of breath.  Patient denies any fever.  Patient current pain score 5 out of 10.        Past Medical History:  Diagnosis Date  . Arrhythmia    atrial fibrillation  . CHF (congestive heart failure) ([place])   . COPD (chronic obstructive pulmonary disease) ([place])   . Enlarged prostate   . Hyperlipemia   . Hypertension   . Stroke [facility])     Patient Active Problem List   Diagnosis Date Noted  . Elevated troponin 06/28/2019  . Acute respiratory failure with hypoxia ([place]) 06/28/2019  . Stroke ([place])   . Hyperlipemia   . Enlarged prostate   . GERD (gastroesophageal reflux disease)   . Hypokalemia   . Acute on chronic combined systolic and diastolic CHF (congestive heart failure) ([place])   . Permanent atrial fibrillation ([place])   . Chronic systolic heart failure ([place]) 02/05/2019  . HTN (hypertension) 02/05/2019  . Acute CHF (congestive heart failure) ([place]) 01/24/2019    History reviewed. No pertinent surgical history.  Prior to Admission medications   Medication Sig Start Date End Date Taking? Authorizing Provider  albuterol (VENTOLIN HFA) 108 (90 Base) MCG/ACT inhaler Inhale 1-2 puffs into the lungs every 4 (four) hours as needed.  05/27/18 05/27/19  [provider]  apixaban (ELIQUIS) 5 MG TABS tablet Take 1 tablet (5 mg total) by mouth 2 (two) times daily. 03/14/19    Loletha Grayer, MD  finasteride (PROSCAR) 5 MG tablet Take 5 mg by mouth daily.  12/02/18   [provider]  furosemide (LASIX) 40 MG tablet Take 1 tablet (40 mg total) by mouth 2 (two) times daily. 03/14/19   Loletha Grayer, MD  lisinopril (ZESTRIL) 20 MG tablet Take 1 tablet (20 mg total) by mouth daily. 06/30/19   Ivor Costa, MD  metoprolol tartrate (LOPRESSOR) 50 MG tablet Take 1 tablet (50 mg total) by mouth 2 (two) times daily. 06/30/19   Ivor Costa, MD  omeprazole (PRILOSEC) 20 MG capsule Take 20 mg by mouth daily.  12/25/18   [provider]  pravastatin (PRAVACHOL) 40 MG tablet TK 1 T PO HS 01/04/19   [provider]  spironolactone (ALDACTONE) 25 MG tablet Take 0.5 tablets (12.5 mg total) by mouth daily. 03/14/19   Loletha Grayer, MD  tamsulosin (FLOMAX) 0.4 MG CAPS capsule Take 0.4 mg by mouth daily.  10/29/18   [provider]    Allergies Patient has no known allergies.  No family history on file.  Social History Social History   Tobacco Use  . Smoking status: Former Research scientist (life sciences)  . Smokeless tobacco: Never Used  Substance Use Topics  . Alcohol use: Not Currently  . Drug use: Never    Review of Systems Constitutional: No fever/chills Eyes: No visual changes. ENT: No sore throat. Cardiovascular: Denies chest pain. Respiratory: Denies shortness of breath. Gastrointestinal: No abdominal pain.  No nausea,  no vomiting.  No diarrhea.  No constipation. Genitourinary: Negative for dysuria. Musculoskeletal: Negative for neck pain.  Negative for back pain.  Positive for left forearm pain and swelling Integumentary: Negative for rash. Neurological: Negative for headaches, focal weakness or numbness.  ____________________________________________   PHYSICAL EXAM:  VITAL SIGNS: ED Triage Vitals  Enc Vitals Group     BP 07/06/19 0019 (!) 159/109     Pulse Rate 07/06/19 0019 80     Resp 07/06/19 0019 20     Temp 07/06/19 0019 98.5 F (36.9 C)      Temp Source 07/06/19 0019 Oral     SpO2 07/06/19 0019 92 %     Weight 07/06/19 0030 67.1 kg (148 lb)     Height 07/06/19 0030 1.626 m (5\' 4" )     Head Circumference --      Peak Flow --      Pain Score 07/06/19 0030 5     Pain Loc --      Pain Edu? --      Excl. in GC? --     Constitutional: Alert and oriented.  Eyes: Conjunctivae are normal.  Mouth/Throat: Patient is wearing a mask. Neck: No stridor.  No meningeal signs.   Cardiovascular: Normal rate, regular rhythm. Good peripheral circulation. Grossly normal heart sounds. Respiratory: Normal respiratory effort.  No retractions. Gastrointestinal: Soft and nontender. No distention.  Musculoskeletal: Left forearm pain, swelling, with ropelike structure palpable Neurologic:  Normal speech and language. No gross focal neurologic deficits are appreciated.  Skin:  Skin is warm, dry and intact.    RADIOLOGY I, Winfield N , personally viewed and evaluated these images (plain radiographs) as part of my medical decision making, as well as reviewing the written report by the radiologist.  ED MD interpretation: Superficial thrombus noted in the left forearm IV site no evidence of DVT.  Official radiology report(s): [place] Venous Img Upper Uni Left  Result Date: 07/06/2019 CLINICAL DATA:  Left arm pain and swelling EXAM: Left UPPER EXTREMITY VENOUS DOPPLER ULTRASOUND TECHNIQUE: Gray-scale sonography with graded compression, as well as color Doppler and duplex ultrasound were performed to evaluate the upper extremity deep venous system from the level of the subclavian vein and including the jugular, axillary, basilic, radial, ulnar and upper cephalic vein. Spectral Doppler was utilized to evaluate flow at rest and with distal augmentation maneuvers. COMPARISON:  None. FINDINGS: Contralateral Subclavian Vein: Respiratory phasicity is normal and symmetric with the symptomatic side. No evidence of thrombus. Normal compressibility. Internal  Jugular Vein: No evidence of thrombus. Normal compressibility, respiratory phasicity and response to augmentation. Subclavian Vein: No evidence of thrombus. Normal compressibility, respiratory phasicity and response to augmentation. Axillary Vein: No evidence of thrombus. Normal compressibility, respiratory phasicity and response to augmentation. Cephalic Vein: Not confidently identified. Basilic Vein: No evidence of thrombus. Normal compressibility, respiratory phasicity and response to augmentation. Brachial Veins: No evidence of thrombus. Normal compressibility, respiratory phasicity and response to augmentation. Radial Veins: No evidence of thrombus. Normal compressibility, respiratory phasicity and response to augmentation. Ulnar Veins: No evidence of thrombus. Normal compressibility, respiratory phasicity and response to augmentation. Venous Reflux:  None visualized. Other Findings: Superficial thrombus noted in the left forearm IV site IMPRESSION: No evidence of DVT within the left upper extremity. Superficial thrombus noted in the left forearm IV site. Electronically Signed   By: Signa Kellaylor  Stroud M.D.   On: 07/06/2019 04:28      Procedures   ____________________________________________   INITIAL IMPRESSION / MDM /  ASSESSMENT AND PLAN / ED COURSE  As part of my medical decision making, I reviewed the following data within the electronic MEDICAL RECORD NUMBER  83 year old male presented with above-stated history and physical exam consistent with possible superficial thrombus/thrombophlebitis and wound much less likely DVT.  Ultrasound revealed evidence of a superficial thrombus in the left forearm IV site.  Spoke with the patient's daughter-in-law at length regarding management at home.  ____________________________________________  FINAL CLINICAL IMPRESSION(S) / ED DIAGNOSES  Final diagnoses:  Acute embolism and thrombosis of superficial vein of left upper extremity     MEDICATIONS GIVEN  DURING THIS VISIT:  Medications - No data to display   ED Discharge Orders    None      *Please note:  Brian Baker was evaluated in Emergency Department on 07/06/2019 for the symptoms described in the history of present illness. He was evaluated in the context of the global COVID-19 pandemic, which necessitated consideration that the patient might be at risk for infection with the SARS-CoV-2 virus that causes COVID-19. Institutional protocols and algorithms that pertain to the evaluation of patients at risk for COVID-19 are in a state of rapid change based on information released by regulatory bodies including the CDC and federal and state organizations. These policies and algorithms were followed during the patient's care in the ED.  Some ED evaluations and interventions may be delayed as a result of limited staffing during the pandemic.*  Note:  This document was prepared using Dragon voice recognition software and may include unintentional dictation errors.   Darci Current, MD 07/06/19 [phone]

## 2019-07-06 NOTE — ED Notes (Signed)
US complete

## 2019-07-06 NOTE — ED Triage Notes (Signed)
Pt arrives to ED via POV from home with daughter-in-law with c/o left arm pain and swelling x1 day. Pt has an area of bruising noted to the left upper forearm where he had an IV placed on last visit. No c/o CP or SHOB, no N/V/D; no fever.

## 2020-09-08 ENCOUNTER — Inpatient Hospital Stay: Payer: Medicaid Other

## 2020-09-08 ENCOUNTER — Inpatient Hospital Stay
Admission: EM | Admit: 2020-09-08 | Discharge: 2020-09-12 | DRG: 177 | Disposition: A | Discharge status: Home / Self Care | Payer: Medicaid Other | Attending: Internal Medicine | Admitting: Internal Medicine

## 2020-09-08 ENCOUNTER — Other Ambulatory Visit: Payer: Self-pay

## 2020-09-08 ENCOUNTER — Emergency Department: Payer: Medicaid Other

## 2020-09-08 DIAGNOSIS — I639 Cerebral infarction, unspecified: Secondary | ICD-10-CM | POA: Diagnosis not present

## 2020-09-08 DIAGNOSIS — I5022 Chronic systolic (congestive) heart failure: Secondary | ICD-10-CM | POA: Diagnosis present

## 2020-09-08 DIAGNOSIS — T380X5A Adverse effect of glucocorticoids and synthetic analogues, initial encounter: Secondary | ICD-10-CM | POA: Diagnosis present

## 2020-09-08 DIAGNOSIS — E1169 Type 2 diabetes mellitus with other specified complication: Secondary | ICD-10-CM | POA: Diagnosis present

## 2020-09-08 DIAGNOSIS — Z79899 Other long term (current) drug therapy: Secondary | ICD-10-CM

## 2020-09-08 DIAGNOSIS — E1122 Type 2 diabetes mellitus with diabetic chronic kidney disease: Secondary | ICD-10-CM | POA: Diagnosis present

## 2020-09-08 DIAGNOSIS — J440 Chronic obstructive pulmonary disease with acute lower respiratory infection: Secondary | ICD-10-CM | POA: Diagnosis present

## 2020-09-08 DIAGNOSIS — G4700 Insomnia, unspecified: Secondary | ICD-10-CM | POA: Diagnosis not present

## 2020-09-08 DIAGNOSIS — J1282 Pneumonia due to coronavirus disease 2019: Secondary | ICD-10-CM | POA: Diagnosis present

## 2020-09-08 DIAGNOSIS — Z87891 Personal history of nicotine dependence: Secondary | ICD-10-CM

## 2020-09-08 DIAGNOSIS — N179 Acute kidney failure, unspecified: Secondary | ICD-10-CM | POA: Diagnosis present

## 2020-09-08 DIAGNOSIS — Z6825 Body mass index (BMI) 25.0-25.9, adult: Secondary | ICD-10-CM

## 2020-09-08 DIAGNOSIS — N400 Enlarged prostate without lower urinary tract symptoms: Secondary | ICD-10-CM | POA: Diagnosis present

## 2020-09-08 DIAGNOSIS — N1831 Chronic kidney disease, stage 3a: Secondary | ICD-10-CM | POA: Diagnosis present

## 2020-09-08 DIAGNOSIS — J9621 Acute and chronic respiratory failure with hypoxia: Secondary | ICD-10-CM | POA: Diagnosis present

## 2020-09-08 DIAGNOSIS — G9341 Metabolic encephalopathy: Secondary | ICD-10-CM | POA: Diagnosis present

## 2020-09-08 DIAGNOSIS — D6869 Other thrombophilia: Secondary | ICD-10-CM | POA: Diagnosis present

## 2020-09-08 DIAGNOSIS — I482 Chronic atrial fibrillation, unspecified: Secondary | ICD-10-CM

## 2020-09-08 DIAGNOSIS — U071 COVID-19: Principal | ICD-10-CM | POA: Diagnosis present

## 2020-09-08 DIAGNOSIS — I4821 Permanent atrial fibrillation: Secondary | ICD-10-CM | POA: Diagnosis present

## 2020-09-08 DIAGNOSIS — E46 Unspecified protein-calorie malnutrition: Secondary | ICD-10-CM | POA: Diagnosis present

## 2020-09-08 DIAGNOSIS — H5461 Unqualified visual loss, right eye, normal vision left eye: Secondary | ICD-10-CM | POA: Diagnosis present

## 2020-09-08 DIAGNOSIS — E785 Hyperlipidemia, unspecified: Secondary | ICD-10-CM | POA: Diagnosis not present

## 2020-09-08 DIAGNOSIS — J9601 Acute respiratory failure with hypoxia: Secondary | ICD-10-CM | POA: Diagnosis not present

## 2020-09-08 DIAGNOSIS — N189 Chronic kidney disease, unspecified: Secondary | ICD-10-CM | POA: Diagnosis not present

## 2020-09-08 DIAGNOSIS — R531 Weakness: Secondary | ICD-10-CM | POA: Diagnosis not present

## 2020-09-08 DIAGNOSIS — E43 Unspecified severe protein-calorie malnutrition: Secondary | ICD-10-CM | POA: Diagnosis present

## 2020-09-08 DIAGNOSIS — Z7901 Long term (current) use of anticoagulants: Secondary | ICD-10-CM

## 2020-09-08 DIAGNOSIS — I10 Essential (primary) hypertension: Secondary | ICD-10-CM | POA: Diagnosis present

## 2020-09-08 DIAGNOSIS — I130 Hypertensive heart and chronic kidney disease with heart failure and stage 1 through stage 4 chronic kidney disease, or unspecified chronic kidney disease: Secondary | ICD-10-CM | POA: Diagnosis present

## 2020-09-08 DIAGNOSIS — R7301 Impaired fasting glucose: Secondary | ICD-10-CM | POA: Diagnosis not present

## 2020-09-08 DIAGNOSIS — I69328 Other speech and language deficits following cerebral infarction: Secondary | ICD-10-CM

## 2020-09-08 LAB — CBG MONITORING, ED
Glucose-Capillary: 225 mg/dL — ABNORMAL HIGH (ref 70–99)
Glucose-Capillary: 244 mg/dL — ABNORMAL HIGH (ref 70–99)

## 2020-09-08 LAB — COMPREHENSIVE METABOLIC PANEL
ALT: 15 U/L (ref 0–44)
AST: 27 U/L (ref 15–41)
Albumin: 3.6 g/dL (ref 3.5–5.0)
Alkaline Phosphatase: 59 U/L (ref 38–126)
Anion gap: 12 (ref 5–15)
BUN: 68 mg/dL — ABNORMAL HIGH (ref 8–23)
CO2: 25 mmol/L (ref 22–32)
Calcium: 8.7 mg/dL — ABNORMAL LOW (ref 8.9–10.3)
Chloride: 101 mmol/L (ref 98–111)
Creatinine, Ser: 1.58 mg/dL — ABNORMAL HIGH (ref 0.61–1.24)
GFR, Estimated: 43 mL/min — ABNORMAL LOW (ref >60)
Glucose, Bld: 312 mg/dL — ABNORMAL HIGH (ref 70–99)
Potassium: 3.9 mmol/L (ref 3.5–5.1)
Sodium: 138 mmol/L (ref 135–145)
Total Bilirubin: 1.5 mg/dL — ABNORMAL HIGH (ref 0.3–1.2)
Total Protein: 7.4 g/dL (ref 6.5–8.1)

## 2020-09-08 LAB — APTT: aPTT: 49 seconds — ABNORMAL HIGH (ref 24–36)

## 2020-09-08 LAB — LACTIC ACID, PLASMA
Lactic Acid, Venous: 0.9 mmol/L (ref 0.5–1.9)
Lactic Acid, Venous: 1.5 mmol/L (ref 0.5–1.9)

## 2020-09-08 LAB — CBC WITH DIFFERENTIAL/PLATELET
Abs Immature Granulocytes: 0.07 10*3/uL (ref 0.00–0.07)
Basophils Absolute: 0 10*3/uL (ref 0.0–0.1)
Basophils Relative: 0 %
Eosinophils Absolute: 0 10*3/uL (ref 0.0–0.5)
Eosinophils Relative: 1 %
HCT: 32.5 % — ABNORMAL LOW (ref 39.0–52.0)
Hemoglobin: 10.4 g/dL — ABNORMAL LOW (ref 13.0–17.0)
Immature Granulocytes: 1 %
Lymphocytes Relative: 11 %
Lymphs Abs: 0.6 10*3/uL — ABNORMAL LOW (ref 0.7–4.0)
MCH: 25.6 pg — ABNORMAL LOW (ref 26.0–34.0)
MCHC: 32 g/dL (ref 30.0–36.0)
MCV: 79.9 fL — ABNORMAL LOW (ref 80.0–100.0)
Monocytes Absolute: 0.4 10*3/uL (ref 0.1–1.0)
Monocytes Relative: 7 %
Neutro Abs: 4.6 10*3/uL (ref 1.7–7.7)
Neutrophils Relative %: 80 %
Platelets: 58 10*3/uL — ABNORMAL LOW (ref 150–400)
RBC: 4.07 MIL/uL — ABNORMAL LOW (ref 4.22–5.81)
RDW: 13.2 % (ref 11.5–15.5)
WBC: 5.8 10*3/uL (ref 4.0–10.5)
nRBC: 0 % (ref 0.0–0.2)

## 2020-09-08 LAB — BLOOD GAS, VENOUS
Acid-Base Excess: 2 mmol/L (ref 0.0–2.0)
Bicarbonate: 25.5 mmol/L (ref 20.0–28.0)
FIO2: 1
O2 Saturation: 89.3 %
Patient temperature: 37
pCO2, Ven: 35 mmHg — ABNORMAL LOW (ref 44.0–60.0)
pH, Ven: 7.47 — ABNORMAL HIGH (ref 7.250–7.430)
pO2, Ven: 53 mmHg — ABNORMAL HIGH (ref 32.0–45.0)

## 2020-09-08 LAB — POC SARS CORONAVIRUS 2 AG -  ED: SARS Coronavirus 2 Ag: POSITIVE — AB

## 2020-09-08 LAB — PROTIME-INR
INR: 1.5 — ABNORMAL HIGH (ref 0.8–1.2)
Prothrombin Time: 17.5 seconds — ABNORMAL HIGH (ref 11.4–15.2)

## 2020-09-08 LAB — PROCALCITONIN: Procalcitonin: 0.14 ng/mL

## 2020-09-08 LAB — BRAIN NATRIURETIC PEPTIDE: B Natriuretic Peptide: 137.3 pg/mL — ABNORMAL HIGH (ref 0.0–100.0)

## 2020-09-08 MED ORDER — APIXABAN 2.5 MG PO TABS
2.5000 mg | ORAL_TABLET | Freq: Two times a day (BID) | ORAL | Status: DC
  Administered 2020-09-09 – 2020-09-10 (×2): 2.5 mg via ORAL
  Filled 2020-09-08 (×3): qty 1

## 2020-09-08 MED ORDER — DILTIAZEM HCL ER COATED BEADS 180 MG PO CP24
180.0000 mg | ORAL_CAPSULE | Freq: Every day | ORAL | Status: DC
  Administered 2020-09-10 – 2020-09-12 (×3): 180 mg via ORAL
  Filled 2020-09-08 (×4): qty 1

## 2020-09-08 MED ORDER — SPIRONOLACTONE 25 MG PO TABS
12.5000 mg | ORAL_TABLET | Freq: Every day | ORAL | Status: DC
  Administered 2020-09-10 – 2020-09-12 (×3): 12.5 mg via ORAL
  Filled 2020-09-08: qty 1
  Filled 2020-09-08 (×3): qty 0.5
  Filled 2020-09-08: qty 1
  Filled 2020-09-08: qty 0.5

## 2020-09-08 MED ORDER — ONDANSETRON HCL 4 MG/2ML IJ SOLN: 4.0000 mg | Freq: Four times a day (QID) | INTRAMUSCULAR | Status: DC | PRN

## 2020-09-08 MED ORDER — ALBUTEROL SULFATE HFA 108 (90 BASE) MCG/ACT IN AERS
2.0000 | INHALATION_SPRAY | RESPIRATORY_TRACT | Status: DC
  Administered 2020-09-09 – 2020-09-12 (×12): 2 via RESPIRATORY_TRACT
  Filled 2020-09-08: qty 6.7

## 2020-09-08 MED ORDER — PRAVASTATIN SODIUM 20 MG PO TABS
40.0000 mg | ORAL_TABLET | Freq: Every day | ORAL | Status: DC
  Administered 2020-09-09 – 2020-09-11 (×3): 40 mg via ORAL
  Filled 2020-09-08 (×2): qty 1
  Filled 2020-09-08: qty 2

## 2020-09-08 MED ORDER — SODIUM CHLORIDE 0.9 % IV SOLN
200.0000 mg | Freq: Once | INTRAVENOUS | Status: AC
  Administered 2020-09-09: 200 mg via INTRAVENOUS
  Filled 2020-09-08: qty 200

## 2020-09-08 MED ORDER — DEXAMETHASONE SODIUM PHOSPHATE 10 MG/ML IJ SOLN
8.0000 mg | Freq: Once | INTRAMUSCULAR | Status: AC
  Administered 2020-09-08: 8 mg via INTRAVENOUS
  Filled 2020-09-08: qty 1

## 2020-09-08 MED ORDER — LACTATED RINGERS IV BOLUS (SEPSIS)
500.0000 mL | Freq: Once | INTRAVENOUS | Status: AC
  Administered 2020-09-08: 500 mL via INTRAVENOUS

## 2020-09-08 MED ORDER — PREDNISONE 50 MG PO TABS: 50.0000 mg | ORAL_TABLET | Freq: Every day | ORAL | Status: DC

## 2020-09-08 MED ORDER — SODIUM CHLORIDE 0.9 % IV SOLN
100.0000 mg | Freq: Every day | INTRAVENOUS | Status: AC
  Administered 2020-09-09 – 2020-09-12 (×4): 100 mg via INTRAVENOUS
  Filled 2020-09-08 (×4): qty 20

## 2020-09-08 MED ORDER — PANTOPRAZOLE SODIUM 40 MG PO TBEC
40.0000 mg | DELAYED_RELEASE_TABLET | Freq: Every day | ORAL | Status: DC
  Administered 2020-09-09 – 2020-09-11 (×3): 40 mg via ORAL
  Filled 2020-09-08 (×3): qty 1

## 2020-09-08 MED ORDER — SODIUM CHLORIDE 0.9 % IV SOLN
500.0000 mg | INTRAVENOUS | Status: DC
  Administered 2020-09-08 – 2020-09-09 (×2): 500 mg via INTRAVENOUS
  Filled 2020-09-08 (×2): qty 500

## 2020-09-08 MED ORDER — LACTATED RINGERS IV SOLN: INTRAVENOUS | Status: AC

## 2020-09-08 MED ORDER — METOPROLOL TARTRATE 50 MG PO TABS
50.0000 mg | ORAL_TABLET | Freq: Two times a day (BID) | ORAL | Status: DC
  Administered 2020-09-09 – 2020-09-11 (×5): 50 mg via ORAL
  Filled 2020-09-08 (×7): qty 1

## 2020-09-08 MED ORDER — LACTATED RINGERS IV SOLN: INTRAVENOUS | Status: DC

## 2020-09-08 MED ORDER — ONDANSETRON HCL 4 MG PO TABS: 4.0000 mg | ORAL_TABLET | Freq: Four times a day (QID) | ORAL | Status: DC | PRN

## 2020-09-08 MED ORDER — BUDESONIDE 180 MCG/ACT IN AEPB
2.0000 | INHALATION_SPRAY | Freq: Two times a day (BID) | RESPIRATORY_TRACT | Status: DC
  Administered 2020-09-09 – 2020-09-11 (×4): 2 via RESPIRATORY_TRACT
  Filled 2020-09-08 (×3): qty 1

## 2020-09-08 MED ORDER — METHYLPREDNISOLONE SODIUM SUCC 125 MG IJ SOLR
1.0000 mg/kg | Freq: Two times a day (BID) | INTRAMUSCULAR | Status: DC
  Administered 2020-09-09 – 2020-09-11 (×5): 72.5 mg via INTRAVENOUS
  Filled 2020-09-08 (×5): qty 2

## 2020-09-08 MED ORDER — APIXABAN 5 MG PO TABS: 5.0000 mg | ORAL_TABLET | Freq: Two times a day (BID) | ORAL | Status: DC

## 2020-09-08 MED ORDER — SODIUM CHLORIDE 0.9 % IV SOLN
2.0000 g | INTRAVENOUS | Status: DC
  Administered 2020-09-08 – 2020-09-09 (×2): 2 g via INTRAVENOUS
  Filled 2020-09-08 (×2): qty 20

## 2020-09-08 MED ORDER — ACETAMINOPHEN 325 MG PO TABS: 325.0000 mg | ORAL_TABLET | Freq: Four times a day (QID) | ORAL | Status: DC | PRN

## 2020-09-08 NOTE — Progress Notes (Signed)
Remdesivir - Pharmacy Brief Note  O:  ALT: 15 CXR:  SpO2: 95 % on    A/P:  Remdesivir 200 mg IVPB once followed by 100 mg IVPB daily x 4 days.   Manuel Dall D 09/08/2020 9:50 PM

## 2020-09-08 NOTE — ED Notes (Signed)
ED at bedside.  Will hold fluid and repeat chest xray

## 2020-09-08 NOTE — ED Provider Notes (Signed)
[facility] Emergency Department Provider Note   ____________________________________________   Event Date/Time   First MD Initiated Contact with Patient 09/08/20 1949     (approximate)  I have reviewed the triage vital signs and the nursing notes.   HISTORY  Chief Complaint Shortness of Breath (Covid positive via rapid home test , alt loc  w/fever )  EM caveat: Patient with communication difficulties, son provides interpretation as the patient has previous history of stroke and causing speech impediment  HPI Brian Baker is a 85 y.o. male with a history of atrial fibrillation congestive heart failure and COPD  Patient was diagnosed with COVID on Friday, he was diagnosed after he developed a fever and fatigue. Son is noticed throughout the last couple of days he is not eating or drinking well, he is also been very fatigued and his conditioning worsening with some shortness of breath as well. EMS reports they were found the patient to be hypoxic, corrected with oxygen therapy, IVs established and patient transported for concerns of potential COVID. EMS also reports patient is noted to have rails and a history of CHF   Past Medical History:  Diagnosis Date  . Arrhythmia    atrial fibrillation  . CHF (congestive heart failure) (H[facility])   . COPD (chronic obstructive pulmonary disease) (H[facility])   . Enlarged prostate   . Hyperlipemia   . Hypertension   . Stroke [facility])     Patient Active Problem List   Diagnosis Date Noted  . Acute hypoxemic respiratory failure due to COVID-19 (H[facility]) 09/08/2020  . Elevated troponin 06/28/2019  . Acute respiratory failure with hypoxia ([facility]) 06/28/2019  . Stroke ([facility])   . Hyperlipemia   . Enlarged prostate   . GERD (gastroesophageal reflux disease)   . Hypokalemia   . Acute on chronic combined systolic and diastolic CHF (congestive heart failure) (H[facility])   . Permanent atrial fibrillation (H[facility])   . Chronic systolic heart failure  (HCC) 68/34/1962  . HTN (hypertension) 02/05/2019  . Acute CHF (congestive heart failure) (H[facility]) 01/24/2019    No past surgical history on file.  Prior to Admission medications   Medication Sig Start Date End Date Taking? Authorizing Provider  apixaban (ELIQUIS) 5 MG TABS tablet Take 1 tablet (5 mg total) by mouth 2 (two) times daily. 03/14/19  Yes Wieting, Richard, MD  diltiazem (CARDIZEM CD) 180 MG 24 hr capsule Take 180 mg by mouth daily.   Yes [provider]  furosemide (LASIX) 40 MG tablet Take 1 tablet (40 mg total) by mouth 2 (two) times daily. 03/14/19  Yes Wieting, Richard, MD  lisinopril (ZESTRIL) 20 MG tablet Take 1 tablet (20 mg total) by mouth daily. 06/30/19  Yes Lorretta Harp, MD  metoprolol tartrate (LOPRESSOR) 50 MG tablet Take 1 tablet (50 mg total) by mouth 2 (two) times daily. 06/30/19  Yes Lorretta Harp, MD  omeprazole (PRILOSEC) 20 MG capsule Take 20 mg by mouth daily.  12/25/18  Yes [provider]  pravastatin (PRAVACHOL) 40 MG tablet TK 1 T PO HS 01/04/19  Yes [provider]  spironolactone (ALDACTONE) 25 MG tablet Take 0.5 tablets (12.5 mg total) by mouth daily. 03/14/19  Yes Wieting, Richard, MD  tamsulosin (FLOMAX) 0.4 MG CAPS capsule Take 0.4 mg by mouth daily.  10/29/18  Yes [provider]    Allergies Patient has no known allergies.  No family history on file.  Social History Social History   Tobacco Use  . Smoking status: Former  Smoker  . Smokeless tobacco: Never Used  Substance Use Topics  . Alcohol use: Not Currently  . Drug use: Never    Review of Systems  EM caveat   ____________________________________________   PHYSICAL EXAM:  VITAL SIGNS: ED Triage Vitals  Enc Vitals Group     BP 09/08/20 2004 90/64     Pulse Rate 09/08/20 2004 (!) 112     Resp 09/08/20 2004 20     Temp 09/08/20 2004 98 F (36.7 C)     Temp Source 09/08/20 2004 Oral     SpO2 09/08/20 2008 95 %     Weight 09/08/20 2004 160 lb (72.6 kg)      Height 09/08/20 2004 5\' 7"  (1.702 m)     Head Circumference --      Peak Flow --      Pain Score --      Pain Loc --      Pain Edu? --      Excl. in GC? --     Constitutional: Alert and oriented. He feels moderately ill, fatigued and slightly tachypneic. Oxygen saturation on 15 L nasal cannula high 90s. He does appear warm and well-perfused extremities.  Eyes: Conjunctivae are normal. Head: Atraumatic. Nose: No congestion/rhinnorhea. Mouth/Throat: Mucous membranes are moist. Neck: No stridor.  Cardiovascular: Slightly tachycardic mildly irregular. Grossly normal heart sounds.  Good peripheral circulation. Respiratory: Patient with mild Rales bases bilateral. No wheezing. He does have mild accessory muscle use and is slightly tachypneic. Mild increased work of breathing Gastrointestinal: Soft and nontender. No distention. Musculoskeletal: No lower extremity tenderness nor edema. No noted JVD. Neurologic: Per the patient's son's speech is normal after he has had his stroke. Patient does move all extremities. He makes good visual contact, performs test such as lifting sheets up covering himself with both arms. Skin:  Skin is warm, dry and intact. No rash noted. Psychiatric: Mood and affect are calm  ____________________________________________   LABS (all labs ordered are listed, but only abnormal results are displayed)  Labs Reviewed  COMPREHENSIVE METABOLIC PANEL - Abnormal; Notable for the following components:      Result Value   Glucose, Bld 312 (*)    BUN 68 (*)    Creatinine, Ser 1.58 (*)    Calcium 8.7 (*)    Total Bilirubin 1.5 (*)    GFR, Estimated 43 (*)    All other components within normal limits  CBC WITH DIFFERENTIAL/PLATELET - Abnormal; Notable for the following components:   RBC 4.07 (*)    Hemoglobin 10.4 (*)    HCT 32.5 (*)    MCV 79.9 (*)    MCH 25.6 (*)    Platelets 58 (*)    Lymphs Abs 0.6 (*)    All other components within normal limits   PROTIME-INR - Abnormal; Notable for the following components:   Prothrombin Time 17.5 (*)    INR 1.5 (*)    All other components within normal limits  APTT - Abnormal; Notable for the following components:   aPTT 49 (*)    All other components within normal limits  BLOOD GAS, VENOUS - Abnormal; Notable for the following components:   pH, Ven 7.47 (*)    pCO2, Ven 35 (*)    pO2, Ven 53.0 (*)    All other components within normal limits  BRAIN NATRIURETIC PEPTIDE - Abnormal; Notable for the following components:   B Natriuretic Peptide 137.3 (*)    All other components within normal limits  POC SARS CORONAVIRUS 2 AG -  ED - Abnormal; Notable for the following components:   SARS Coronavirus 2 Ag Positive (*)    All other components within normal limits  CBG MONITORING, ED - Abnormal; Notable for the following components:   Glucose-Capillary 225 (*)    All other components within normal limits  CBG MONITORING, ED - Abnormal; Notable for the following components:   Glucose-Capillary 244 (*)    All other components within normal limits  CULTURE, BLOOD (ROUTINE X 2)  CULTURE, BLOOD (ROUTINE X 2)  URINE CULTURE  LACTIC ACID, PLASMA  LACTIC ACID, PLASMA  PROCALCITONIN  URINALYSIS, COMPLETE (UACMP) WITH MICROSCOPIC  CBC WITH DIFFERENTIAL/PLATELET  COMPREHENSIVE METABOLIC PANEL  C-REACTIVE PROTEIN  FIBRIN DERIVATIVES D-DIMER ([facility] ONLY)   ____________________________________________  EKG  Reviewed by me at 1950 Heart rate 95 QRS 90 QTC 460 Atrial fibrillation, no evidence of acute ischemia, mild nonspecific T wave abnormality ____________________________________________  RADIOLOGY  DG Chest Portable 1 View  Result Date: 09/08/2020 CLINICAL DATA:  Possible CHF EXAM: PORTABLE CHEST 1 VIEW COMPARISON:  Film from earlier in the same day. FINDINGS: Cardiac shadow is enlarged but stable. Aortic calcifications are again noted. The lungs are well aerated bilaterally. Skin fold is  noted over the left chest. No focal infiltrate or sizable effusion is noted. No bony abnormality is seen. IMPRESSION: No acute abnormality noted. Electronically Signed   By: Alcide CleverMark  Lukens M.D.   On: 09/08/2020 22:36   DG Chest Port 1 View  Result Date: 09/08/2020 CLINICAL DATA:  Shortness of breath, COVID positive. EXAM: PORTABLE CHEST 1 VIEW COMPARISON:  June 27, 2019 FINDINGS: Mild areas of atelectasis and/or infiltrate are seen within the bilateral lung bases and mid left lung. There is no evidence of a pleural effusion or pneumothorax. There is mild to moderate severity enlargement of the cardiac silhouette. Mild calcification of the aortic arch is noted. Degenerative changes seen within the mid and lower thoracic spine. IMPRESSION: Mild bilateral atelectasis and/or infiltrate. Electronically Signed   By: Aram Candelahaddeus  Houston M.D.   On: 09/08/2020 20:40    Initial and repeat chest imaging reviewed by me.  Initial imaging concerning for bilateral atelectasis or infiltrate, repeat imaging well aerated, improved aeration ____________________________________________   PROCEDURES  Procedure(s) performed: None  Procedures  Critical Care performed: Yes, see critical care note(s)  CRITICAL CARE Performed by: Sharyn CreamerMark Quale   Total critical care time: 30 minutes  Critical care time was exclusive of separately billable procedures and treating other patients.  Critical care was necessary to treat or prevent imminent or life-threatening deterioration.  Critical care was time spent personally by me on the following activities: development of treatment plan with patient and/or surrogate as well as nursing, discussions with consultants, evaluation of patient's response to treatment, examination of patient, obtaining history from patient or surrogate, ordering and performing treatments and interventions, ordering and review of laboratory studies, ordering and review of radiographic studies, pulse  oximetry and re-evaluation of patient's condition.  ____________________________________________   INITIAL IMPRESSION / ASSESSMENT AND PLAN / ED COURSE  Pertinent labs & imaging results that were available during my care of the patient were reviewed by me and considered in my medical decision making (see chart for details).   Patient presents and tested positive for COVID here as well as reportedly on Friday.  He has evidence of mild hypotension and tachycardia and significant oxygen requirement.  Imaging demonstrates infiltrative process in the lung bases bilateral, and does have a history  of CHF with mild Rales however appears dry and volume down.  Oxygen corrects on 15 L, and the patient shows evidence of improvement after fluid resuscitation.  Denies chest pain history of A. fib.  Patient is anticoagulated per son had his medications this morning  ----------------------------------------- 11:43 PM on 09/08/2020 ----------------------------------------- Patient earlier was noted to have continued Rales, repeat imaging does not demonstrate evidence of acute pulmonary edema developing.  He appears to be responding well with regard to blood pressure with fluids.  Patient be admitted to hospitalist service, on my repeat assessment is respiratory status appears to be consistent with when he presented.  I do not see evidence of acute volume overload at this time.     Admission discussed with Dr. Sedalia Muta  ____________________________________________   FINAL CLINICAL IMPRESSION(S) / ED DIAGNOSES  Final diagnoses:  Acute hypoxemic respiratory failure due to COVID-19 [facility])        Note:  This document was prepared using Dragon voice recognition software and may include unintentional dictation errors       Sharyn Creamer, MD 09/08/20 2346

## 2020-09-08 NOTE — Progress Notes (Signed)
CODE SEPSIS - PHARMACY COMMUNICATION  **Broad Spectrum Antibiotics should be administered within 1 hour of Sepsis diagnosis**  Time Code Sepsis Called/Page Received:  1/18 @ 1958   Antibiotics Ordered: Ceftriaxone , Azithromycin   Time of 1st antibiotic administration:  Ceftriaxone 2 gm IV X 1 1/18 @ 1958   Additional action taken by pharmacy:   If necessary, Name of Provider/Nurse Contacted:     Mystie Ormand D ,PharmD Clinical Pharmacist  09/08/2020  9:22 PM

## 2020-09-08 NOTE — H&P (Signed)
History and Physical   Brian Baker QPY:[id number] DOB: 09-Nov-1935 DOA: 09/08/2020  PCP: Dr. Loleta Rose Outpatient Specialists: Dr. Lynnea Ferrier, [facility] cardiologist  Patient coming from: home  I have personally briefly reviewed patient's old medical records in [facility] EMR.  Chief Concern: Behavioral changes  HPI obtained from son as patient has been minimally verbal to nonverbal since his stroke in 2019  HPI: Brian Baker is a 85 y.o. [place] refugee male with medical history significant for right eye blindness (1960), hypertension, combined heart failure, permanent atrial fibrillation, aspiration pneumonia, CVA in 2019, came via EMS, presented to the emergency department for chief concerns of not behaving like himself.  Son called EMS because patient had behavioral changes, including patient not walking and not responding to family.  Son states that patient was opening eyes and still breathing.  Son reports that at baseline patient is able to ambulate with cane and able to respond yes or no to some questions including: ' Are you hungry', ' do you want to eat'.  Reports that the last time patient was his normal self was approximately noon on 09/08/2020.   Furthermore, son endorses that for the last 2 days patient has had poor p.o. intake and generally appears weaker than his baseline.  Son reports that at baseline patient ambulates weekly with a cane.  Son states that he and his brother are going to get patient a wheelchair tomorrow 09/09/2020.  Son endorses T max of 104. Son denies vomiting, diarrhea. Patient last had a bowel movement at 10 AM and it was normal per family. Family denies blood and/or black stool.   Vaccination: Patient is vaccinated x2 dose, he has not received his booster yet  ROS: Unable to participate as patient is nonverbal  ED Course: Discussed with ED provider, patient requiring hospitalization due to COVID-19 infection requiring oxygen  supplementation.  Assessment/Plan  Principal Problem:   Acute hypoxemic respiratory failure due to COVID-19  [facility]) Active Problems:   Chronic systolic heart failure (HCC)   HTN (hypertension)   Permanent atrial fibrillation (HCC)   Stroke (HCC)   Hyperlipemia   Protein malnutrition (HCC)   Acute hypoxemic respiratory failure secondary to COVID-19 infection - IV remdesivir per pharmacy, IV solumedrol 1 g/kg IV q12h initiated  - Superimposed bacterial infection coverage with ceftriaxone 1 g daily IV and azithromycin 500 mg IV daily started - Incentive spirometry and flutter valve for 10 reps every 2 hours while awake - Albuterol inhaler 2 puffs every 4 hours while awake, budesonide 2 puffs inhaler twice daily - Daily labs: CMP, CBC, CRP, D-dimer - continue high flow nasal cannula to maintain SPO2 goal of greater than 88% - Airborne and contact precautions - Admit to stepdown inpatient  Acute kidney injury-suspect secondary to prerenal in setting of poor p.o. intake - Baseline serum creatinine is 0.95-1.16 in 2020 - Serum creatinine on presentation was 1.58, GFR 43 -Status post total of 1 L per ED provider -LR IVF at 125 cc/h for 10 hours -BMP in the a.m. -Given COVID-19 infection, we will maintain conservative fluid resuscitation  Microcytic anemia -patient's baseline hemoglobin is 11.6-12.7 in 2020 - Extensive questions by myself regarding vomiting, stool colors were all negative - Low clinical suspicion for GI bleed at this time due to history obtained from son - Continue to monitor - CBC in a.m.  Patient meets criteria for sepsis secondary to COVID-19 - Blood cultures x2 pending - UA ordered and pending collection - Continue broad-spectrum antibiotics with ceftriaxone and azithromycin  pending cultures  Combined heart failure does not appear to be in exacerbation at this time - Resumed home metoprolol tartrate 50 mg p.o. twice daily, spironolactone 12.5 mg daily  Atrial  fibrillation- resumed home metoprolol tartrate 50 mg twice daily, diltiazem 180 mg p.o. daily, Eliquis p.o. twice daily - Change Eliquis to 2.5 mg p.o. twice daily due to serum creatinine greater than 1.5 and age greater than 80   Bilateral lower extremity muscle atrophy and appears to have chronic muscle loss- doubt patient is able to ambulate at baseline even with a walker  Protein malnutrition-present on admission, dietary has been consulted for recommendations of supplementation  As needed Meds: Acetaminophen, ondansetron  Chart reviewed.   DVT prophylaxis: Apixaban 5 mg p.o. twice daily Code Status: Full code Diet: N.p.o. pending speech evaluation Family Communication: Brian Baker, son who lives with patient Disposition Plan: Pending clinical course Consults called: Not at this time Admission status: Inpatient to stepdown  Past Medical History:  Diagnosis Date  . Arrhythmia    atrial fibrillation  . CHF (congestive heart failure) (HCC)   . COPD (chronic obstructive pulmonary disease) (HCC)   . Enlarged prostate   . Hyperlipemia   . Hypertension   . Stroke [facility])    No past surgical history on file.  Social History:  reports that he has quit smoking. He has never used smokeless tobacco. He reports previous alcohol use. He reports that he does not use drugs.  No Known Allergies No family history on file. Family history: Family history reviewed and not pertinent  Prior to Admission medications   Medication Sig Start Date End Date Taking? Authorizing Provider  apixaban (ELIQUIS) 5 MG TABS tablet Take 1 tablet (5 mg total) by mouth 2 (two) times daily. 03/14/19  Yes Wieting, Richard, MD  diltiazem (CARDIZEM CD) 180 MG 24 hr capsule Take 180 mg by mouth daily.   Yes [provider]  furosemide (LASIX) 40 MG tablet Take 1 tablet (40 mg total) by mouth 2 (two) times daily. 03/14/19  Yes Wieting, Richard, MD  lisinopril (ZESTRIL) 20 MG tablet Take 1 tablet (20 mg total) by  mouth daily. 06/30/19  Yes Lorretta Harp, MD  metoprolol tartrate (LOPRESSOR) 50 MG tablet Take 1 tablet (50 mg total) by mouth 2 (two) times daily. 06/30/19  Yes Lorretta Harp, MD  omeprazole (PRILOSEC) 20 MG capsule Take 20 mg by mouth daily.  12/25/18  Yes [provider]  pravastatin (PRAVACHOL) 40 MG tablet TK 1 T PO HS 01/04/19  Yes [provider]  spironolactone (ALDACTONE) 25 MG tablet Take 0.5 tablets (12.5 mg total) by mouth daily. 03/14/19  Yes Wieting, Richard, MD  tamsulosin (FLOMAX) 0.4 MG CAPS capsule Take 0.4 mg by mouth daily.  10/29/18  Yes [provider]   Physical Exam: Vitals:   09/08/20 2004 09/08/20 2008  BP: 90/64   Pulse: (!) 112   Resp: 20   Temp: 98 F (36.7 C)   TempSrc: Oral   SpO2:  95%  Weight: 72.6 kg   Height: 5\' 7"  (1.702 m)    Constitutional: appears consistent with chronological age, cachectic, frail, Brian, calm, comfortable Eyes: PERRL, lids and conjunctivae normal ENMT: Mucous membranes are moist. Posterior pharynx clear of any exudate or lesions.  Poor dentition.  Unable to assess as patient is nonverbal Neck: normal, supple, no masses, no thyromegaly Respiratory: clear to auscultation bilaterally, no wheezing, no crackles. Normal respiratory effort. No accessory muscle use.   Cardiovascular: Regular rate  and rhythm, no murmurs / rubs / gallops. No extremity edema. 2+ pedal pulses. No carotid bruits.  Abdomen: no tenderness, no masses palpated, no hepatosplenomegaly. Bowel sounds positive.  Musculoskeletal: no clubbing / cyanosis. No joint deformity upper and lower extremities. Good ROM, no contractures.  Decreased lower extremity muscle tone. Bilateral lower extremity atrophy Skin: no rashes, lesions, ulcers. No induration Neurologic: Sensation intact. Strength 5/5 in all 4.  Psychiatric: Normal judgment and insight. Alert and oriented x 3. Normal mood.   EKG: independently reviewed, showing atrial fibrillation, rate of 98, QTc  459  Chest x-ray on Admission: I personally reviewed and I agree with radiologist reading as below.  DG Chest Portable 1 View  Result Date: 09/08/2020 CLINICAL DATA:  Possible CHF EXAM: PORTABLE CHEST 1 VIEW COMPARISON:  Film from earlier in the same day. FINDINGS: Cardiac shadow is enlarged but stable. Aortic calcifications are again noted. The lungs are well aerated bilaterally. Skin fold is noted over the left chest. No focal infiltrate or sizable effusion is noted. No bony abnormality is seen. IMPRESSION: No acute abnormality noted. Electronically Signed   By: Alcide Clever M.D.   On: 09/08/2020 22:36   DG Chest Port 1 View  Result Date: 09/08/2020 CLINICAL DATA:  Shortness of breath, COVID positive. EXAM: PORTABLE CHEST 1 VIEW COMPARISON:  June 27, 2019 FINDINGS: Mild areas of atelectasis and/or infiltrate are seen within the bilateral lung bases and mid left lung. There is no evidence of a pleural effusion or pneumothorax. There is mild to moderate severity enlargement of the cardiac silhouette. Mild calcification of the aortic arch is noted. Degenerative changes seen within the mid and lower thoracic spine. IMPRESSION: Mild bilateral atelectasis and/or infiltrate. Electronically Signed   By: Aram Candela M.D.   On: 09/08/2020 20:40   Labs on Admission: I have personally reviewed following labs  CBC: Recent Labs  Lab 09/08/20 1951  WBC 5.8  NEUTROABS 4.6  HGB 10.4*  HCT 32.5*  MCV 79.9*  PLT 58*   Basic Metabolic Panel: Recent Labs  Lab 09/08/20 1951  NA 138  K 3.9  CL 101  CO2 25  GLUCOSE 312*  BUN 68*  CREATININE 1.58*  CALCIUM 8.7*   GFR: Estimated Creatinine Clearance: 33.7 mL/min (A) (by C-G formula based on SCr of 1.58 mg/dL (H)).  Liver Function Tests: Recent Labs  Lab 09/08/20 1951  AST 27  ALT 15  ALKPHOS 59  BILITOT 1.5*  PROT 7.4  ALBUMIN 3.6   Coagulation Profile: Recent Labs  Lab 09/08/20 1951  INR 1.5*   CBG: Recent Labs  Lab  09/08/20 2258 09/08/20 2300  GLUCAP 225* 244*   Urine analysis:    Component Value Date/Time   COLORURINE YELLOW (A) 03/12/2019 2239   APPEARANCEUR CLEAR (A) 03/12/2019 2239   LABSPEC 1.010 03/12/2019 2239   PHURINE 7.0 03/12/2019 2239   GLUCOSEU NEGATIVE 03/12/2019 2239   HGBUR SMALL (A) 03/12/2019 2239   BILIRUBINUR NEGATIVE 03/12/2019 2239   KETONESUR NEGATIVE 03/12/2019 2239   PROTEINUR 30 (A) 03/12/2019 2239   NITRITE NEGATIVE 03/12/2019 2239   LEUKOCYTESUR LARGE (A) 03/12/2019 2239   Brian Baker N Kyden Potash D.O. [facility]  If 7PM-7AM, please contact overnight-coverage provider If 7AM-7PM, please contact day coverage provider [email or web address]  09/08/2020, 11:33 PM

## 2020-09-09 DIAGNOSIS — I5022 Chronic systolic (congestive) heart failure: Secondary | ICD-10-CM

## 2020-09-09 DIAGNOSIS — G9341 Metabolic encephalopathy: Secondary | ICD-10-CM

## 2020-09-09 DIAGNOSIS — I639 Cerebral infarction, unspecified: Secondary | ICD-10-CM

## 2020-09-09 DIAGNOSIS — N189 Chronic kidney disease, unspecified: Secondary | ICD-10-CM

## 2020-09-09 DIAGNOSIS — R531 Weakness: Secondary | ICD-10-CM

## 2020-09-09 DIAGNOSIS — I4821 Permanent atrial fibrillation: Secondary | ICD-10-CM

## 2020-09-09 DIAGNOSIS — R7301 Impaired fasting glucose: Secondary | ICD-10-CM

## 2020-09-09 DIAGNOSIS — N179 Acute kidney failure, unspecified: Secondary | ICD-10-CM

## 2020-09-09 LAB — CBC WITH DIFFERENTIAL/PLATELET
Abs Immature Granulocytes: 0.13 10*3/uL — ABNORMAL HIGH (ref 0.00–0.07)
Basophils Absolute: 0 10*3/uL (ref 0.0–0.1)
Basophils Relative: 0 %
Eosinophils Absolute: 0 10*3/uL (ref 0.0–0.5)
Eosinophils Relative: 0 %
HCT: 32.4 % — ABNORMAL LOW (ref 39.0–52.0)
Hemoglobin: 10.2 g/dL — ABNORMAL LOW (ref 13.0–17.0)
Immature Granulocytes: 2 %
Lymphocytes Relative: 9 %
Lymphs Abs: 0.5 10*3/uL — ABNORMAL LOW (ref 0.7–4.0)
MCH: 25.7 pg — ABNORMAL LOW (ref 26.0–34.0)
MCHC: 31.5 g/dL (ref 30.0–36.0)
MCV: 81.6 fL (ref 80.0–100.0)
Monocytes Absolute: 0.2 10*3/uL (ref 0.1–1.0)
Monocytes Relative: 4 %
Neutro Abs: 4.9 10*3/uL (ref 1.7–7.7)
Neutrophils Relative %: 85 %
Platelets: 53 10*3/uL — ABNORMAL LOW (ref 150–400)
RBC: 3.97 MIL/uL — ABNORMAL LOW (ref 4.22–5.81)
RDW: 13.3 % (ref 11.5–15.5)
WBC: 5.8 10*3/uL (ref 4.0–10.5)
nRBC: 0 % (ref 0.0–0.2)

## 2020-09-09 LAB — COMPREHENSIVE METABOLIC PANEL
ALT: 16 U/L (ref 0–44)
AST: 28 U/L (ref 15–41)
Albumin: 3.4 g/dL — ABNORMAL LOW (ref 3.5–5.0)
Alkaline Phosphatase: 63 U/L (ref 38–126)
Anion gap: 16 — ABNORMAL HIGH (ref 5–15)
BUN: 59 mg/dL — ABNORMAL HIGH (ref 8–23)
CO2: 25 mmol/L (ref 22–32)
Calcium: 8.7 mg/dL — ABNORMAL LOW (ref 8.9–10.3)
Chloride: 101 mmol/L (ref 98–111)
Creatinine, Ser: 1.51 mg/dL — ABNORMAL HIGH (ref 0.61–1.24)
GFR, Estimated: 46 mL/min — ABNORMAL LOW (ref >60)
Glucose, Bld: 369 mg/dL — ABNORMAL HIGH (ref 70–99)
Potassium: 4.5 mmol/L (ref 3.5–5.1)
Sodium: 142 mmol/L (ref 135–145)
Total Bilirubin: 1.2 mg/dL (ref 0.3–1.2)
Total Protein: 7.3 g/dL (ref 6.5–8.1)

## 2020-09-09 LAB — URINALYSIS, COMPLETE (UACMP) WITH MICROSCOPIC
Bacteria, UA: NONE SEEN
Bilirubin Urine: NEGATIVE
Glucose, UA: >=500 mg/dL — AB
Ketones, ur: 5 mg/dL — AB
Nitrite: NEGATIVE
Protein, ur: NEGATIVE mg/dL
Specific Gravity, Urine: 1.017 (ref 1.005–1.030)
WBC, UA: >50 WBC/hpf — ABNORMAL HIGH (ref 0–5)
pH: 5 (ref 5.0–8.0)

## 2020-09-09 LAB — FIBRIN DERIVATIVES D-DIMER (ARMC ONLY): Fibrin derivatives D-dimer (ARMC): 753.14 ng/mL (FEU) — ABNORMAL HIGH (ref 0.00–499.00)

## 2020-09-09 LAB — C-REACTIVE PROTEIN: CRP: 18.6 mg/dL — ABNORMAL HIGH (ref <1.0)

## 2020-09-09 LAB — PATHOLOGIST SMEAR REVIEW

## 2020-09-09 LAB — CBG MONITORING, ED: Glucose-Capillary: 359 mg/dL — ABNORMAL HIGH (ref 70–99)

## 2020-09-09 MED ORDER — INSULIN ASPART 100 UNIT/ML ~~LOC~~ SOLN
0.0000 [IU] | Freq: Every day | SUBCUTANEOUS | Status: DC
  Administered 2020-09-10: 4 [IU] via SUBCUTANEOUS
  Administered 2020-09-10: 5 [IU] via SUBCUTANEOUS
  Administered 2020-09-11: 2 [IU] via SUBCUTANEOUS
  Filled 2020-09-09 (×3): qty 1

## 2020-09-09 MED ORDER — INSULIN DETEMIR 100 UNIT/ML ~~LOC~~ SOLN
8.0000 [IU] | Freq: Every day | SUBCUTANEOUS | Status: DC
  Administered 2020-09-10: 8 [IU] via SUBCUTANEOUS
  Filled 2020-09-09 (×2): qty 0.08

## 2020-09-09 MED ORDER — BARICITINIB 2 MG PO TABS
2.0000 mg | ORAL_TABLET | Freq: Every day | ORAL | Status: DC
  Administered 2020-09-10 – 2020-09-12 (×3): 2 mg via ORAL
  Filled 2020-09-09 (×4): qty 1

## 2020-09-09 MED ORDER — INSULIN ASPART 100 UNIT/ML ~~LOC~~ SOLN
0.0000 [IU] | Freq: Three times a day (TID) | SUBCUTANEOUS | Status: DC
  Administered 2020-09-09: 9 [IU] via SUBCUTANEOUS
  Administered 2020-09-10: 5 [IU] via SUBCUTANEOUS
  Administered 2020-09-10: 7 [IU] via SUBCUTANEOUS
  Administered 2020-09-10: 3 [IU] via SUBCUTANEOUS
  Administered 2020-09-11 (×2): 9 [IU] via SUBCUTANEOUS
  Administered 2020-09-11: 3 [IU] via SUBCUTANEOUS
  Administered 2020-09-12: 14:00:00 7 [IU] via SUBCUTANEOUS
  Filled 2020-09-09 (×8): qty 1

## 2020-09-09 NOTE — Progress Notes (Addendum)
Chart reviewed, bedside swallow eval completed. Full report to follow. Pt was sleeping but awakened for Po's and seemed hungry, eager to eat, nodding head yes. Pt tolerated applesauce and water without difficulty. Oral deficits with solids. Dys 1 diet ordered. Rec meds crushed in applesauce. ST to follow. COVID precautions in place.

## 2020-09-09 NOTE — Progress Notes (Signed)
Inpatient Diabetes Program Recommendations  AACE/ADA: New Consensus Statement on Inpatient Glycemic Control (2015)  Target Ranges:  Prepandial:   less than 140 mg/dL      Peak postprandial:   less than 180 mg/dL (1-2 hours)      Critically ill patients:  140 - 180 mg/dL   Lab Results  Component Value Date   GLUCAP 244 (H) 09/08/2020    Review of Glycemic Control Results for Brian Baker, Brian Baker (MRN [id number]) as of 09/09/2020 11:57  Ref. Range 09/08/2020 22:58 09/08/2020 23:00  Glucose-Capillary Latest Ref Range: 70 - 99 mg/dL 409 (H) 811 (H)   Diabetes history: no hx noted Outpatient Diabetes medications: none  Current orders for Inpatient glycemic control: none Solumedrol 72 mg BID  Inpatient Diabetes Program Recommendations:    Glucose trends exceeding inpatient goals of 180 mg/dL.   Consider adding:  -A1C -Novolog 0-9 units Q4H Levemir 8 units BID.   Thanks, Lujean Rave, MSN, RNC-OB Diabetes Coordinator [phone] (8a-5p)

## 2020-09-09 NOTE — Progress Notes (Signed)
Patient ID: Brian Baker, male   DOB: 02/10/1936, 85 y.o.   MRN: [id number] [facility] PROGRESS NOTE  Brian Baker [id number]:[id number] DOB: Jun 27, 1936 DOA: 09/08/2020 PCP: [facility]  HPI/Subjective:   Patient admitted yesterday with altered mental status and weakness.  Found to have a COVID infection.  Today through the translator he was speaking so softly he was hard to understand.  The translator could not hear him.  The patient did shake his head yes to some questions that I did ask.  Objective: Vitals:   09/09/20 0830 09/09/20 0900  BP: 102/76 121/86  Pulse: (!) 101 92  Resp: 16 14  Temp:    SpO2: 99% 100%    Intake/Output Summary (Last 24 hours) at 09/09/2020 1339 Last data filed at 09/09/2020 0100 Gross per 24 hour  Intake 250 ml  Output --  Net 250 ml   Filed Weights   09/08/20 2004  Weight: 72.6 kg    ROS: Review of Systems  Respiratory: Positive for shortness of breath.   Cardiovascular: Negative for chest pain.   Exam: Physical Exam HENT:     Head: Normocephalic.     Mouth/Throat:     Pharynx: No oropharyngeal exudate.  Eyes:     General: Lids are normal.     Conjunctiva/sclera: Conjunctivae normal.  Cardiovascular:     Rate and Rhythm: Normal rate and regular rhythm.     Heart sounds: Normal heart sounds, S1 normal and S2 normal.  Pulmonary:     Breath sounds: Examination of the right-lower field reveals decreased breath sounds. Examination of the left-lower field reveals decreased breath sounds. Decreased breath sounds present. No wheezing, rhonchi or rales.  Abdominal:     Palpations: Abdomen is soft.     Tenderness: There is no abdominal tenderness.  Musculoskeletal:     Right lower leg: No swelling.     Left lower leg: No swelling.  Skin:    General: Skin is warm.     Findings: No rash.  Neurological:     Mental Status: He is alert.       Data Reviewed: Basic Metabolic Panel: Recent Labs  Lab 09/08/20 1951 09/09/20 0557  NA  138 142  K 3.9 4.5  CL 101 101  CO2 25 25  GLUCOSE 312* 369*  BUN 68* 59*  CREATININE 1.58* 1.51*  CALCIUM 8.7* 8.7*   Liver Function Tests: Recent Labs  Lab 09/08/20 1951 09/09/20 0557  AST 27 28  ALT 15 16  ALKPHOS 59 63  BILITOT 1.5* 1.2  PROT 7.4 7.3  ALBUMIN 3.6 3.4*   CBC: Recent Labs  Lab 09/08/20 1951 09/09/20 0557  WBC 5.8 5.8  NEUTROABS 4.6 4.9  HGB 10.4* 10.2*  HCT 32.5* 32.4*  MCV 79.9* 81.6  PLT 58* 53*   BNP (last 3 results) Recent Labs    09/08/20 2233  BNP 137.3*    CBG: Recent Labs  Lab 09/08/20 2258 09/08/20 2300  GLUCAP 225* 244*    Recent Results (from the past 240 hour(s))  Blood Culture (routine x 2)     Status: None (Preliminary result)   Collection Time: 09/08/20  7:51 PM   Specimen: BLOOD  Result Value Ref Range Status   Specimen Description BLOOD BLOOD RIGHT FOREARM  Final   Special Requests   Final    BOTTLES DRAWN AEROBIC AND ANAEROBIC Blood Culture results may not be optimal due to an excessive volume of blood received in culture bottles   Culture  Final    NO GROWTH < 12 HOURS Performed at [facility], [address] [place], [place] [address]    Report Status PENDING  Incomplete  Blood Culture (routine x 2)     Status: None (Preliminary result)   Collection Time: 09/08/20  7:51 PM   Specimen: BLOOD  Result Value Ref Range Status   Specimen Description BLOOD LEFT ANTECUBITAL  Final   Special Requests   Final    BOTTLES DRAWN AEROBIC AND ANAEROBIC Blood Culture adequate volume   Culture   Final    NO GROWTH < 12 HOURS Performed at [facility], [address] [place], [place] [address]    Report Status PENDING  Incomplete     Studies: DG Chest Portable 1 View  Result Date: 09/08/2020 CLINICAL DATA:  Possible CHF EXAM: PORTABLE CHEST 1 VIEW COMPARISON:  Film from earlier in the same day. FINDINGS: Cardiac shadow is enlarged but stable. Aortic calcifications are again noted. The lungs are  well aerated bilaterally. Skin fold is noted over the left chest. No focal infiltrate or sizable effusion is noted. No bony abnormality is seen. IMPRESSION: No acute abnormality noted. Electronically Signed   By: Alcide Clever M.D.   On: 09/08/2020 22:36   DG Chest Port 1 View  Result Date: 09/08/2020 CLINICAL DATA:  Shortness of breath, COVID positive. EXAM: PORTABLE CHEST 1 VIEW COMPARISON:  June 27, 2019 FINDINGS: Mild areas of atelectasis and/or infiltrate are seen within the bilateral lung bases and mid left lung. There is no evidence of a pleural effusion or pneumothorax. There is mild to moderate severity enlargement of the cardiac silhouette. Mild calcification of the aortic arch is noted. Degenerative changes seen within the mid and lower thoracic spine. IMPRESSION: Mild bilateral atelectasis and/or infiltrate. Electronically Signed   By: Aram Candela M.D.   On: 09/08/2020 20:40    Scheduled Meds: . albuterol  2 puff Inhalation Q4H  . apixaban  2.5 mg Oral BID  . baricitinib  4 mg Oral Daily  . budesonide  2 puff Inhalation BID  . diltiazem  180 mg Oral Daily  . methylPREDNISolone (SOLU-MEDROL) injection  1 mg/kg Intravenous Q12H   Followed by  . [START ON 09/12/2020] predniSONE  50 mg Oral Daily  . metoprolol tartrate  50 mg Oral BID  . pantoprazole  40 mg Oral q1800  . pravastatin  40 mg Oral q1800  . spironolactone  12.5 mg Oral Daily   Continuous Infusions: . azithromycin Stopped (09/08/20 2204)  . cefTRIAXone (ROCEPHIN)  IV Stopped (09/08/20 2053)  . remdesivir 100 mg in NS 100 mL Stopped (09/09/20 1047)    Assessment/Plan:  1. Acute hypoxic area last respiratory failure due to COVID-19 infection and possible pneumonia.  Patient on salter high flow nasal cannula 15 L.  Patient on remdesivir, Solu-Medrol.  Low blood sugar added baricitinib with elevated CRP and being on high flow nasal cannula. 2. Acute metabolic encephalopathy.  Patient has history of a stroke.   Speaking very softly and translator unable to anything by mouth today as understand the patient.  Case discussed with son and we will meet at the bedside tomorrow at 9 AM.  Patient placed on dysphagia diet. 3. Acute kidney injury on chronic kidney disease stage IIIa.  Patient received IV fluids overnight.  Hopefully being back on a diet will help out with this.  Hold off on diuretic.  Check again tomorrow morning. 4. Chronic systolic congestive heart failure.  Holding off  on diuretic.  Patient on spironolactone.  Patient on metoprolol. 5. Chronic atrial fibrillation on metoprolol diltiazem and Eliquis. 6. Weakness.  Physical therapy evaluation  7. Impaired fasting glucose.  Check a hemoglobin A1c.  Sugars elevated secondary to steroids.  Start low-dose Lantus and sliding scale 8. History of completed ischemic stroke on Eliquis for anticoagulation. 9. Hyperlipidemia unspecified on pravastatin        Code Status:     Code Status Orders  (From admission, onward)         Start     Ordered   09/08/20 2114  Full code  Continuous        09/08/20 2116        Code Status History    Date Active Date Inactive Code Status Order ID Comments User Context   06/27/2019 2216 06/30/2019 1905 Full Code [id number]  [facility] Vernetta Honey, MD ED   03/13/2019 0133 03/14/2019 1550 Full Code [id number]  [facility] Vernetta Honey, MD ED   01/24/2019 2354 01/25/2019 1904 Full Code [id number]  Mansy, Vernetta Honey, MD ED   Advance Care Planning Activity     Family Communication: Spoke with son on the phone Disposition Plan: Status is: Inpatient  Dispo: The patient is from: Home              Anticipated d/c is to: Home              Anticipated d/c date is: Likely will need for 5 days here in the hospital              Patient currently being treated for acute hypoxic respiratory failure secondary to COVID-19 pneumonia.  Time spent: 28 minutes, through Print production planner.  Aditri Louischarles [employer]  [facility] [employer]

## 2020-09-09 NOTE — ED Notes (Signed)
Pt noted to be incontinent of urine. Pt provided with new linens, gown, and brief. Pt repositioned in bed and assisted with eating meal with use of translator. Pt does not voice any other concerns at this time.

## 2020-09-09 NOTE — Evaluation (Signed)
Clinical/Bedside Swallow Evaluation Patient Details  Name: Brian Baker MRN: [id number] Date of Birth: 1936/05/01  Today's Date: 09/09/2020 Time: SLP Start Time (ACUTE ONLY): 1220 SLP Stop Time (ACUTE ONLY): 1300 SLP Time Calculation (min) (ACUTE ONLY): 40 min  Past Medical History:  Past Medical History:  Diagnosis Date  . Arrhythmia    atrial fibrillation  . CHF (congestive heart failure) (HCC)   . COPD (chronic obstructive pulmonary disease) (HCC)   . Enlarged prostate   . Hyperlipemia   . Hypertension   . Stroke [facility])    Past Surgical History: No past surgical history on file. HPI:  per admitting H&P "Brian Baker is a 85 y.o. [place] refugee male with medical history significant for right eye blindness (1960), hypertension, combined heart failure, permanent atrial fibrillation, aspiration pneumonia, CVA in 2019, came via EMS, presented to the emergency department for chief concerns of not behaving like himself.     Son called EMS because patient had behavioral changes, including patient not walking and not responding to family.  Son states that patient was opening eyes and still breathing.  Son reports that at baseline patient is able to ambulate with cane and able to respond yes or no to some questions including: ' Are you hungry', ' do you want to eat'.  Reports that the last time patient was his normal self was approximately noon on 09/08/2020.      Furthermore, son endorses that for the last 2 days patient has had poor p.o. intake and generally appears weaker than his baseline.  Son reports that at baseline patient ambulates weekly with a cane.     Son states that he and his brother are going to get patient a wheelchair tomorrow 09/09/2020.     Son endorses T max of 104. Son denies vomiting, diarrhea. Patient last had a bowel movement at 10 AM and it was normal per family. Family denies blood and/or black stool.      Vaccination: Patient is vaccinated x2 dose, he has not received his booster yet      ROS: Unable to participate as patient is nonverbal" COVID positive   Assessment / Plan / Recommendation Clinical Impression st Bedside swallow eval revealed overall mild to moderate oral phase dysphagia with inability to adequately masticate solids for a safe swallow. NO s/s of aspiration with any tested consistency. Pt was sleeping upon ST entering but awakened easily for Po's. No family present for eval. Pt made no attempts to verbalize but did nod his head yes when presented with the cup or spoon. Pt tolerated 4 oz of applesauce with no oral difficulty or s/s of aspiration. Given solid boluses, Pt needed extended time to masticate and presented with mild to moderate oral residue. Pt was able to take several sips of thin water from a straw with no s/s of aspiration. Rec Dys 1 diet with thin liquids. Meds to be given in applesauce one at a time. May need to crush them if Pt does not tolerate whole. ST to follow up with toleration of diet 1-2 days. SLP Visit Diagnosis: Dysphagia, oropharyngeal phase (R13.12)    Aspiration Risk  Mild aspiration risk    Diet Recommendation Dysphagia 1 (Puree)   Liquid Administration via: Cup;Straw Medication Administration: Whole meds with puree Supervision: Staff to assist with self feeding;Full supervision/cueing for compensatory strategies Compensations: Minimize environmental distractions;Slow rate;Small sips/bites Postural Changes: Seated upright at 90 degrees;Remain upright for at least 30 minutes after po intake    Other  Recommendations Oral Care Recommendations: Oral care BID   Follow up Recommendations   ST to f/u with toleration of diet     Frequency and Duration min 1 x/week  1 week       Prognosis Prognosis for Safe Diet Advancement: Guarded Barriers to Reach Goals: Cognitive deficits      Swallow Study   General Date of Onset: 09/08/20 HPI: per admitting H&P "Brian Baker is a 85 y.o. [place] refugee male with medical history  significant for right eye blindness (1958), hypertension, combined heart failure, permanent atrial fibrillation, aspiration pneumonia, CVA in 2019, came via EMS, presented to the emergency department for chief concerns of not behaving like himself.     Son called EMS because patient had behavioral changes, including patient not walking and not responding to family.  Son states that patient was opening eyes and still breathing.  Son reports that at baseline patient is able to ambulate with cane and able to respond yes or no to some questions including: ' Are you hungry', ' do you want to eat'.  Reports that the last time patient was his normal self was approximately noon on 09/08/2020.      Furthermore, son endorses that for the last 2 days patient has had poor p.o. intake and generally appears weaker than his baseline.  Son reports that at baseline patient ambulates weekly with a cane.     Son states that he and his brother are going to get patient a wheelchair tomorrow 09/09/2020.     Son endorses T max of 104. Son denies vomiting, diarrhea. Patient last had a bowel movement at 10 AM and it was normal per family. Family denies blood and/or black stool.      Vaccination: Patient is vaccinated x2 dose, he has not received his booster yet     ROS: Unable to participate as patient is nonverbal" COVID positive Type of Study: Bedside Swallow Evaluation Diet Prior to this Study: NPO Temperature Spikes Noted: No Respiratory Status: Nasal cannula History of Recent Intubation: No Behavior/Cognition: Alert;Cooperative;Pleasant mood;Other (Comment) (Non verbal, did not follow commands) Oral Cavity Assessment: Other (comment) (Not able to thoroughly assess) Oral Cavity - Dentition: Edentulous Vision: Impaired for self-feeding Self-Feeding Abilities: Total assist Patient Positioning: Upright in bed Baseline Vocal Quality: Other (comment) (No attempts to verbalize) Volitional Cough: Congested    Oral/Motor/Sensory  Function     Ice Chips Ice chips: Within functional limits Presentation: Spoon   Thin Liquid Thin Liquid: Within functional limits Presentation: Straw    Nectar Thick Nectar Thick Liquid: Not tested   Honey Thick Honey Thick Liquid: Not tested   Puree Puree: Within functional limits Presentation: Spoon   Solid     Solid: Impaired Oral Phase Impairments: Impaired mastication Oral Phase Functional Implications: Prolonged oral transit;Impaired mastication;Oral residue      Eather Colas 09/09/2020,1:14 PM

## 2020-09-10 DIAGNOSIS — E785 Hyperlipidemia, unspecified: Secondary | ICD-10-CM

## 2020-09-10 LAB — BLOOD CULTURE ID PANEL (REFLEXED) - BCID2

## 2020-09-10 LAB — HEMOGLOBIN A1C
Hgb A1c MFr Bld: 7.2 % — ABNORMAL HIGH (ref 4.8–5.6)
Mean Plasma Glucose: 159.94 mg/dL

## 2020-09-10 LAB — COMPREHENSIVE METABOLIC PANEL
ALT: 17 U/L (ref 0–44)
AST: 30 U/L (ref 15–41)
Albumin: 3.4 g/dL — ABNORMAL LOW (ref 3.5–5.0)
Alkaline Phosphatase: 65 U/L (ref 38–126)
Anion gap: 12 (ref 5–15)
BUN: 66 mg/dL — ABNORMAL HIGH (ref 8–23)
CO2: 29 mmol/L (ref 22–32)
Calcium: 8.9 mg/dL (ref 8.9–10.3)
Chloride: 107 mmol/L (ref 98–111)
Creatinine, Ser: 1.33 mg/dL — ABNORMAL HIGH (ref 0.61–1.24)
GFR, Estimated: 53 mL/min — ABNORMAL LOW (ref >60)
Glucose, Bld: 348 mg/dL — ABNORMAL HIGH (ref 70–99)
Potassium: 4 mmol/L (ref 3.5–5.1)
Sodium: 148 mmol/L — ABNORMAL HIGH (ref 135–145)
Total Bilirubin: 1 mg/dL (ref 0.3–1.2)
Total Protein: 7.5 g/dL (ref 6.5–8.1)

## 2020-09-10 LAB — CBC WITH DIFFERENTIAL/PLATELET
Abs Immature Granulocytes: 0.07 10*3/uL (ref 0.00–0.07)
Basophils Absolute: 0 10*3/uL (ref 0.0–0.1)
Basophils Relative: 0 %
Eosinophils Absolute: 0 10*3/uL (ref 0.0–0.5)
Eosinophils Relative: 0 %
HCT: 35.3 % — ABNORMAL LOW (ref 39.0–52.0)
Hemoglobin: 10.8 g/dL — ABNORMAL LOW (ref 13.0–17.0)
Immature Granulocytes: 1 %
Lymphocytes Relative: 10 %
Lymphs Abs: 0.8 10*3/uL (ref 0.7–4.0)
MCH: 25.4 pg — ABNORMAL LOW (ref 26.0–34.0)
MCHC: 30.6 g/dL (ref 30.0–36.0)
MCV: 82.9 fL (ref 80.0–100.0)
Monocytes Absolute: 0.2 10*3/uL (ref 0.1–1.0)
Monocytes Relative: 3 %
Neutro Abs: 6.7 10*3/uL (ref 1.7–7.7)
Neutrophils Relative %: 86 %
Platelets: 72 10*3/uL — ABNORMAL LOW (ref 150–400)
RBC: 4.26 MIL/uL (ref 4.22–5.81)
RDW: 13.6 % (ref 11.5–15.5)
WBC: 7.8 10*3/uL (ref 4.0–10.5)
nRBC: 0 % (ref 0.0–0.2)

## 2020-09-10 LAB — C-REACTIVE PROTEIN: CRP: 19.5 mg/dL — ABNORMAL HIGH (ref <1.0)

## 2020-09-10 LAB — CBG MONITORING, ED
Glucose-Capillary: 242 mg/dL — ABNORMAL HIGH (ref 70–99)
Glucose-Capillary: 271 mg/dL — ABNORMAL HIGH (ref 70–99)
Glucose-Capillary: 308 mg/dL — ABNORMAL HIGH (ref 70–99)
Glucose-Capillary: 328 mg/dL — ABNORMAL HIGH (ref 70–99)

## 2020-09-10 LAB — D-DIMER, QUANTITATIVE: D-Dimer, Quant: 1.87 ug/mL-FEU — ABNORMAL HIGH (ref 0.00–0.50)

## 2020-09-10 LAB — PROCALCITONIN: Procalcitonin: <0.1 ng/mL

## 2020-09-10 MED ORDER — PNEUMOCOCCAL VAC POLYVALENT 25 MCG/0.5ML IJ INJ: 0.5000 mL | INJECTION | INTRAMUSCULAR | Status: DC

## 2020-09-10 MED ORDER — INSULIN ASPART 100 UNIT/ML ~~LOC~~ SOLN
3.0000 [IU] | Freq: Three times a day (TID) | SUBCUTANEOUS | Status: DC
  Administered 2020-09-10 – 2020-09-11 (×3): 3 [IU] via SUBCUTANEOUS
  Filled 2020-09-10 (×2): qty 1

## 2020-09-10 MED ORDER — INSULIN DETEMIR 100 UNIT/ML ~~LOC~~ SOLN
8.0000 [IU] | Freq: Two times a day (BID) | SUBCUTANEOUS | Status: DC
  Administered 2020-09-11: 8 [IU] via SUBCUTANEOUS
  Filled 2020-09-10 (×4): qty 0.08

## 2020-09-10 MED ORDER — APIXABAN 5 MG PO TABS
5.0000 mg | ORAL_TABLET | Freq: Two times a day (BID) | ORAL | Status: DC
Start: 2020-09-10 — End: 2020-09-12
  Administered 2020-09-10 – 2020-09-12 (×4): 5 mg via ORAL
  Filled 2020-09-10 (×4): qty 1

## 2020-09-10 NOTE — Progress Notes (Signed)
Nsg reports pt ate 100 percent of lunch today and had no s/s of aspiration. Tolerating pureed diet well and seems to like it. ST to follow up only if indicated. Continue with current Dysphagia  1 diet.

## 2020-09-10 NOTE — Progress Notes (Signed)
Inpatient Diabetes Program Recommendations  AACE/ADA: New Consensus Statement on Inpatient Glycemic Control (2015)  Target Ranges:  Prepandial:   less than 140 mg/dL      Peak postprandial:   less than 180 mg/dL (1-2 hours)      Critically ill patients:  140 - 180 mg/dL   Lab Results  Component Value Date   GLUCAP 308 (H) 09/10/2020   HGBA1C 7.2 (H) 09/10/2020    Review of Glycemic Control  Diabetes history: no hx noted Outpatient Diabetes medications: none  Current orders for Inpatient glycemic control: Levemir 8 units q hs + Novolog 0-9 units tid Solumedrol 72 mg BID  Inpatient Diabetes Program Recommendations:    Glucose trends exceeding inpatient goals of 180 mg/dL.   Consider adding:  -Increase Novolog 0-9 units to Q4H -Increase Levemir to 8 units BID.   Thank you, Billy Fischer. Tashawn Laswell, RN, MSN, CDE  Diabetes Coordinator Inpatient Glycemic Control Team Team Pager [phone] (8am-5pm) 09/10/2020 11:55 AM

## 2020-09-10 NOTE — Evaluation (Signed)
Physical Therapy Evaluation Patient Details Name: Brian Baker MRN: [id number] DOB: [birth year] Today's Date: 09/10/2020   History of Present Illness  85 y.o. Brian Baker refugee male with medical history significant for right eye blindness (1960), hypertension, combined heart failure, permanent atrial fibrillation, aspiration pneumonia, CVA in 2019.   Son called EMS because patient had behavioral changes, including patient not walking and not responding to family.  Covid (+)  Clinical Impression  Limited eval secondary to pt's cognition/lethargy, fatigue with minimal activity on 7L and language barrier (nurse had limited success with telesitter earlier today and pt waves it off when offered during PT exam).  Spoke with son on the phone who reports that until last week when fever kicked in the patient was able to ambulate in the home with a walker and was able to manage dressing, ADLs largely w/o any assist.  However over the last week he could not even take steps and family needed to carry him around the home and assist with all ADLs.  They are very much wanting to take him home and given that today he was at least able to tolerate some sitting the son feels that he will continue to improve and that he wants to avoid the idea of going to rehab.  Pt will need to show significant improvement to insure home as a safe plan, will maintain on caseload in the hopes that cognition/tolerance improves and he can more fully participate with PT.     Follow Up Recommendations Supervision/Assistance - 24 hour;Home health PT (per ability on eval would need STR, but family very keen to take him home and can give 24/7 assist and open to HHPT)    Equipment Recommendations  Rolling walker with 5" wheels    Recommendations for Other Services       Precautions / Restrictions Precautions Precautions: Fall Restrictions Weight Bearing Restrictions: No      Mobility  Bed Mobility Overal bed mobility: Needs Assistance Bed  Mobility: Supine to Sit;Sit to Supine     Supine to sit: Mod assist Sit to supine: Max assist   General bed mobility comments: Pt struggled with sitting EOB - leaning to the R and struggling to use UEs to maintain upright (consistent direct assist needed from PT), despite being on 7L his O2 dropped from high 90s to high 80s relatively quickly.    Transfers                 General transfer comment: deferred as pt was too confused/lethargic and sats dropping despite 7L O2  Ambulation/Gait                Stairs            Wheelchair Mobility    Modified Rankin (Stroke Patients Only)       Balance Overall balance assessment: Needs assistance Sitting-balance support: Single extremity supported Sitting balance-Leahy Scale: Poor Sitting balance - Comments: pt leaning to the R, unable to self correct needing consistent direct assist to maintain upright       Standing balance comment: deferred                             Pertinent Vitals/Pain Pain Assessment: Faces Faces Pain Scale: No hurt    Home Living Family/patient expects to be discharged to:: Private residence (pt familiar to this PT from admission 1.5 years ago, family very much wanting to take him home at that time,  likely similar mindset this time?) Living Arrangements: Spouse/significant other;Children;Other relatives Available Help at Discharge: Available 24 hours/day;Family   Home Access: Level entry       Home Equipment: Cane - single point;Wheelchair - manual      Prior Function Level of Independence: Needs assistance   Gait / Transfers Assistance Needed: Pt uses cane at baseline, per son until last week he could mange in home distances easily with cane  ADL's / Homemaking Assistance Needed: per son until last week he was able to dress and use bathroom, etc w/o direct assist        Hand Dominance        Extremity/Trunk Assessment   Upper Extremity Assessment Upper  Extremity Assessment: Difficult to assess due to impaired cognition (able to use UEs some to assist with PT assisted mobility, poor tolerance and unable to follow cues for testing)    Lower Extremity Assessment Lower Extremity Assessment: Generalized weakness;Difficult to assess due to impaired cognition (pt was able to do some AROM with ankles, knees, hips showing at least 3/5 strength but did not tolerate actual testing)       Communication   Communication: Prefers language other than English (tele interp in room, pt waved it off when PT offered to dial in.  Per nursing she tried using it earlier with little effect.  Son was present earlier but left before PT was able to work with him)  Cognition Arousal/Alertness: Lethargic Behavior During Therapy: Flat affect (pt opens eyes and arrival, but inconsistently t/o session. did not seem overly interested/able to participate with PT) Overall Cognitive Status: Difficult to assess                                 General Comments: Pt following some cues but not overly interactive      General Comments General comments (skin integrity, edema, etc.): Pt did not show much awareness t/o the session, able to show some effort with tactile cuing but again inconsistent.  Quick to fatigue with modest activity despite 7L O2.    Exercises     Assessment/Plan    PT Assessment Patient needs continued PT services  PT Problem List Decreased strength;Decreased range of motion;Decreased activity tolerance;Decreased mobility;Decreased balance;Decreased cognition;Decreased knowledge of use of DME;Decreased safety awareness       PT Treatment Interventions Gait training;DME instruction;Stair training;Functional mobility training;Therapeutic activities;Therapeutic exercise;Balance training;Cognitive remediation;Patient/family education    PT Goals (Current goals can be found in the Care Plan section)  Acute Rehab PT Goals Patient Stated Goal:  son very much wants to take pt home, as opposed to rehab PT Goal Formulation: With family Time For Goal Achievement: 09/24/20 Potential to Achieve Goals: Fair    Frequency Min 2X/week   Barriers to discharge        Co-evaluation               AM-PAC PT "6 Clicks" Mobility  Outcome Measure Help needed turning from your back to your side while in a flat bed without using bedrails?: A Lot Help needed moving from lying on your back to sitting on the side of a flat bed without using bedrails?: A Lot Help needed moving to and from a bed to a chair (including a wheelchair)?: Total Help needed standing up from a chair using your arms (e.g., wheelchair or bedside chair)?: Total Help needed to walk in hospital room?: Total Help needed climbing 3-5  steps with a railing? : Total 6 Click Score: 8    End of Session Equipment Utilized During Treatment: Oxygen (7L, O2 drops with minimal exertion) Activity Tolerance: Patient limited by fatigue Patient left: with bed alarm set;with call bell/phone within reach   PT Visit Diagnosis: Muscle weakness (generalized) (M62.81);Unsteadiness on feet (R26.81);Difficulty in walking, not elsewhere classified (R26.2)    Time: 9977-4142 PT Time Calculation (min) (ACUTE ONLY): 20 min   Charges:   PT Evaluation $PT Eval Low Complexity: 1 Low          Malachi Pro, DPT 09/10/2020, 1:36 PM

## 2020-09-10 NOTE — ED Notes (Signed)
Pt assisted with eating lunch. Pt able to 100% of meal without any difficulty swallowing.

## 2020-09-10 NOTE — Consult Note (Signed)
ANTICOAGULATION CONSULT NOTE - Initial Consult  Pharmacy Consult for Eliquis Indication: atrial fibrillation  No Known Allergies  Patient Measurements: Height: 5\' 7"  (170.2 cm) Weight: 72.6 kg (160 lb) IBW/kg (Calculated) : 66.1  Vital Signs: BP: 125/89 (01/20 0940) Pulse Rate: 78 (01/20 0940)  Labs: Recent Labs    09/08/20 1951 09/09/20 0557 09/10/20 0408  HGB 10.4* 10.2* 10.8*  HCT 32.5* 32.4* 35.3*  PLT 58* 53* 72*  APTT 49*  --   --   LABPROT 17.5*  --   --   INR 1.5*  --   --   CREATININE 1.58* 1.51* 1.33*    Estimated Creatinine Clearance: 40 mL/min (A) (by C-G formula based on SCr of 1.33 mg/dL (H)).   Medical History: Past Medical History:  Diagnosis Date  . Arrhythmia    atrial fibrillation  . CHF (congestive heart failure) (HCC)   . COPD (chronic obstructive pulmonary disease) (HCC)   . Enlarged prostate   . Hyperlipemia   . Hypertension   . Stroke [facility])     Medications:  Eliquis 5 mg BID PTA  Assessment: 85 y.o. male with medical history significant for right eye blindness, hypertension, combined heart failure, permanent atrial fibrillation, aspiration pneumonia, CVA in 2019, presented to the emergency department for chief concerns of not behaving like himself and was found to be COVID+.   The patient was on Eliquis 5 mg BID PTA for Afib which was reduced to 2.5 mg BID on admission for SCr>1.5 and age greater than 80. The patient SCr has since improved to 1.33 and pharmacy has been consulted for Eliquis.   H/H stable and plts low but improving.  SCr: 1.58>1.51>1.33   Goal of Therapy:  Monitor platelets by anticoagulation protocol: Yes   Plan:  Increase Eliquis to 5 mg BID  Monitor CBC, SCr, and s/s of bleed   2020 09/10/2020,9:46 AM

## 2020-09-10 NOTE — ED Notes (Signed)
Patient oxygen increased from 10 to 11 L [place] High Flow due to Spo2 84-86%.

## 2020-09-10 NOTE — ED Notes (Signed)
Pt noted to be incontinent of urine. Pt provided with new clean linens, gown, and brief. External catheter replaced.

## 2020-09-10 NOTE — ED Notes (Signed)
Feeding pt breakfast at this time.

## 2020-09-10 NOTE — Progress Notes (Signed)
PHARMACY - PHYSICIAN COMMUNICATION CRITICAL VALUE ALERT - BLOOD CULTURE IDENTIFICATION (BCID)  Brian Baker is an 85 y.o. male who presented to [facility] on 09/08/2020 with a chief complaint of SOB  Assessment:  Lab reports 1 of 4 bottles w/ staph sp, GPC  Name of physician (or Provider) Contacted: Reyes Ivan NP  Current antibiotics: Rocephin and Zithromax  Changes to prescribed antibiotics recommended: pt afebrile, nml WBC, possible contamination, no changes recommended Recommendations accepted by provider  Results for orders placed or performed during the hospital encounter of 09/08/20  Blood Culture ID Panel (Reflexed) (Collected: 09/08/2020  7:51 PM)  Result Value Ref Range   Enterococcus faecalis NOT DETECTED NOT DETECTED   Enterococcus Faecium NOT DETECTED NOT DETECTED   Listeria monocytogenes NOT DETECTED NOT DETECTED   Staphylococcus species DETECTED (A) NOT DETECTED   Staphylococcus aureus (BCID) NOT DETECTED NOT DETECTED   Staphylococcus epidermidis NOT DETECTED NOT DETECTED   Staphylococcus lugdunensis NOT DETECTED NOT DETECTED   Streptococcus species NOT DETECTED NOT DETECTED   Streptococcus agalactiae NOT DETECTED NOT DETECTED   Streptococcus pneumoniae NOT DETECTED NOT DETECTED   Streptococcus pyogenes NOT DETECTED NOT DETECTED   A.calcoaceticus-baumannii NOT DETECTED NOT DETECTED   Bacteroides fragilis NOT DETECTED NOT DETECTED   Enterobacterales NOT DETECTED NOT DETECTED   Enterobacter cloacae complex NOT DETECTED NOT DETECTED   Escherichia coli NOT DETECTED NOT DETECTED   Klebsiella aerogenes NOT DETECTED NOT DETECTED   Klebsiella oxytoca NOT DETECTED NOT DETECTED   Klebsiella pneumoniae NOT DETECTED NOT DETECTED   Proteus species NOT DETECTED NOT DETECTED   Salmonella species NOT DETECTED NOT DETECTED   Serratia marcescens NOT DETECTED NOT DETECTED   Haemophilus influenzae NOT DETECTED NOT DETECTED   Neisseria meningitidis NOT DETECTED NOT DETECTED   Pseudomonas  aeruginosa NOT DETECTED NOT DETECTED   Stenotrophomonas maltophilia NOT DETECTED NOT DETECTED   Candida albicans NOT DETECTED NOT DETECTED   Candida auris NOT DETECTED NOT DETECTED   Candida glabrata NOT DETECTED NOT DETECTED   Candida krusei NOT DETECTED NOT DETECTED   Candida parapsilosis NOT DETECTED NOT DETECTED   Candida tropicalis NOT DETECTED NOT DETECTED   Cryptococcus neoformans/gattii NOT DETECTED NOT DETECTED    [name] A 09/10/2020  5:31 AM

## 2020-09-10 NOTE — Progress Notes (Addendum)
Patient ID: Brian Baker, male   DOB: [birth year], 85 y.o.   MRN: [id number] [facility] PROGRESS NOTE  Kenner Lewan ERX:[id number] DOB: [birth year] DOA: 09/08/2020 PCP: [facility]  HPI/Subjective: Patient's son able to come in and he thinks the patient is doing better today. More alert and more communicative. He would like to take the patient home as quickly as possible. Patient still on salter high flow nasal cannula this morning. I tried dialing him down on that this morning. Patient still held his saturations.  Objective: Vitals:   09/10/20 0545 09/10/20 0940  BP:  125/89  Pulse: 74 78  Resp: 17 (!) 26  Temp:    SpO2: 97% 93%    Intake/Output Summary (Last 24 hours) at 09/10/2020 1321 Last data filed at 09/10/2020 0016 Gross per 24 hour  Intake 250 ml  Output --  Net 250 ml   Filed Weights   09/08/20 2004  Weight: 72.6 kg    ROS: Review of Systems  Respiratory: Positive for shortness of breath. Negative for cough.   Cardiovascular: Negative for chest pain.  Gastrointestinal: Negative for abdominal pain, nausea and vomiting.   Exam: Physical Exam HENT:     Head: Normocephalic.     Mouth/Throat:     Pharynx: No oropharyngeal exudate.  Eyes:     General: Lids are normal.     Conjunctiva/sclera: Conjunctivae normal.  Cardiovascular:     Rate and Rhythm: Normal rate and regular rhythm.     Heart sounds: Normal heart sounds, S1 normal and S2 normal.  Pulmonary:     Breath sounds: Examination of the right-lower field reveals decreased breath sounds and rhonchi. Examination of the left-lower field reveals decreased breath sounds and rhonchi. Decreased breath sounds and rhonchi present. No wheezing or rales.  Abdominal:     Palpations: Abdomen is soft.     Tenderness: There is no abdominal tenderness.  Musculoskeletal:     Right lower leg: No swelling.     Left lower leg: No swelling.  Skin:    General: Skin is warm.     Findings: No rash.  Neurological:      Mental Status: He is alert.     Comments: Answers some simple questions and able to communicate better with his son then with the interpreter yesterday.       Data Reviewed: Basic Metabolic Panel: Recent Labs  Lab 09/08/20 1951 09/09/20 0557 09/10/20 0408  NA 138 142 148*  K 3.9 4.5 4.0  CL 101 101 107  CO2 25 25 29   GLUCOSE 312* 369* 348*  BUN 68* 59* 66*  CREATININE 1.58* 1.51* 1.33*  CALCIUM 8.7* 8.7* 8.9   Liver Function Tests: Recent Labs  Lab 09/08/20 1951 09/09/20 0557 09/10/20 0408  AST 27 28 30   ALT 15 16 17   ALKPHOS 59 63 65  BILITOT 1.5* 1.2 1.0  PROT 7.4 7.3 7.5  ALBUMIN 3.6 3.4* 3.4*   CBC: Recent Labs  Lab 09/08/20 1951 09/09/20 0557 09/10/20 0408  WBC 5.8 5.8 7.8  NEUTROABS 4.6 4.9 6.7  HGB 10.4* 10.2* 10.8*  HCT 32.5* 32.4* 35.3*  MCV 79.9* 81.6 82.9  PLT 58* 53* 72*   BNP (last 3 results) Recent Labs    09/08/20 2233  BNP 137.3*    CBG: Recent Labs  Lab 09/08/20 2300 09/09/20 1829 09/10/20 0029 09/10/20 0742 09/10/20 1234  GLUCAP 244* 359* 328* 308* 242*    Recent Results (from the past 240 hour(s))  Blood Culture (  routine x 2)     Status: None (Preliminary result)   Collection Time: 09/08/20  7:51 PM   Specimen: BLOOD  Result Value Ref Range Status   Specimen Description BLOOD BLOOD RIGHT FOREARM  Final   Special Requests   Final    BOTTLES DRAWN AEROBIC AND ANAEROBIC Blood Culture results may not be optimal due to an excessive volume of blood received in culture bottles   Culture   Final    NO GROWTH 2 DAYS Performed at [facility], [address] [place], [place] [address]    Report Status PENDING  Incomplete  Blood Culture (routine x 2)     Status: None (Preliminary result)   Collection Time: 09/08/20  7:51 PM   Specimen: BLOOD  Result Value Ref Range Status   Specimen Description BLOOD LEFT ANTECUBITAL  Final   Special Requests   Final    BOTTLES DRAWN AEROBIC AND ANAEROBIC Blood Culture adequate  volume   Culture  Setup Time   Final    GRAM POSITIVE COCCI AEROBIC BOTTLE ONLY Organism ID to follow CRITICAL RESULT CALLED TO, READ BACK BY AND VERIFIED WITH: SCOTT HALL AT 8250 09/10/20 SDR Performed at [facility], [address] [place], [place] [address]    Culture GRAM POSITIVE COCCI  Final   Report Status PENDING  Incomplete  Blood Culture ID Panel (Reflexed)     Status: Abnormal   Collection Time: 09/08/20  7:51 PM  Result Value Ref Range Status   Enterococcus faecalis NOT DETECTED NOT DETECTED Final   Enterococcus Faecium NOT DETECTED NOT DETECTED Final   Listeria monocytogenes NOT DETECTED NOT DETECTED Final   Staphylococcus species DETECTED (A) NOT DETECTED Final    Comment: CRITICAL RESULT CALLED TO, READ BACK BY AND VERIFIED WITH:  SCOTT HALL AT 0526 09/10/20 SDR    Staphylococcus aureus (BCID) NOT DETECTED NOT DETECTED Final   Staphylococcus epidermidis NOT DETECTED NOT DETECTED Final   Staphylococcus lugdunensis NOT DETECTED NOT DETECTED Final   Streptococcus species NOT DETECTED NOT DETECTED Final   Streptococcus agalactiae NOT DETECTED NOT DETECTED Final   Streptococcus pneumoniae NOT DETECTED NOT DETECTED Final   Streptococcus pyogenes NOT DETECTED NOT DETECTED Final   A.calcoaceticus-baumannii NOT DETECTED NOT DETECTED Final   Bacteroides fragilis NOT DETECTED NOT DETECTED Final   Enterobacterales NOT DETECTED NOT DETECTED Final   Enterobacter cloacae complex NOT DETECTED NOT DETECTED Final   Escherichia coli NOT DETECTED NOT DETECTED Final   Klebsiella aerogenes NOT DETECTED NOT DETECTED Final   Klebsiella oxytoca NOT DETECTED NOT DETECTED Final   Klebsiella pneumoniae NOT DETECTED NOT DETECTED Final   Proteus species NOT DETECTED NOT DETECTED Final   Salmonella species NOT DETECTED NOT DETECTED Final   Serratia marcescens NOT DETECTED NOT DETECTED Final   Haemophilus influenzae NOT DETECTED NOT DETECTED Final   Neisseria meningitidis NOT  DETECTED NOT DETECTED Final   Pseudomonas aeruginosa NOT DETECTED NOT DETECTED Final   Stenotrophomonas maltophilia NOT DETECTED NOT DETECTED Final   Candida albicans NOT DETECTED NOT DETECTED Final   Candida auris NOT DETECTED NOT DETECTED Final   Candida glabrata NOT DETECTED NOT DETECTED Final   Candida krusei NOT DETECTED NOT DETECTED Final   Candida parapsilosis NOT DETECTED NOT DETECTED Final   Candida tropicalis NOT DETECTED NOT DETECTED Final   Cryptococcus neoformans/gattii NOT DETECTED NOT DETECTED Final    Comment: Performed at [facility], [address], [place], [place] [address]     Studies: [place]  Chest Portable 1 View  Result Date: 09/08/2020 CLINICAL DATA:  Possible CHF EXAM: PORTABLE CHEST 1 VIEW COMPARISON:  Film from earlier in the same day. FINDINGS: Cardiac shadow is enlarged but stable. Aortic calcifications are again noted. The lungs are well aerated bilaterally. Skin fold is noted over the left chest. No focal infiltrate or sizable effusion is noted. No bony abnormality is seen. IMPRESSION: No acute abnormality noted. Electronically Signed   By: Alcide Clever M.D.   On: 09/08/2020 22:36   DG Chest Port 1 View  Result Date: 09/08/2020 CLINICAL DATA:  Shortness of breath, COVID positive. EXAM: PORTABLE CHEST 1 VIEW COMPARISON:  June 27, 2019 FINDINGS: Mild areas of atelectasis and/or infiltrate are seen within the bilateral lung bases and mid left lung. There is no evidence of a pleural effusion or pneumothorax. There is mild to moderate severity enlargement of the cardiac silhouette. Mild calcification of the aortic arch is noted. Degenerative changes seen within the mid and lower thoracic spine. IMPRESSION: Mild bilateral atelectasis and/or infiltrate. Electronically Signed   By: Aram Candela M.D.   On: 09/08/2020 20:40    Scheduled Meds: . albuterol  2 puff Inhalation Q4H  . apixaban  5 mg Oral BID  . baricitinib  2 mg Oral Daily  . budesonide  2  puff Inhalation BID  . diltiazem  180 mg Oral Daily  . insulin aspart  0-5 Units Subcutaneous QHS  . insulin aspart  0-9 Units Subcutaneous TID WC  . insulin aspart  3 Units Subcutaneous TID WC  . insulin detemir  8 Units Subcutaneous BID  . methylPREDNISolone (SOLU-MEDROL) injection  1 mg/kg Intravenous Q12H   Followed by  . [START ON 09/12/2020] predniSONE  50 mg Oral Daily  . metoprolol tartrate  50 mg Oral BID  . pantoprazole  40 mg Oral q1800  . pravastatin  40 mg Oral q1800  . spironolactone  12.5 mg Oral Daily   Continuous Infusions: . azithromycin Stopped (09/10/20 0016)  . cefTRIAXone (ROCEPHIN)  IV Stopped (09/09/20 2100)  . remdesivir 100 mg in NS 100 mL Stopped (09/10/20 1015)    Assessment/Plan:  1. Acute hypoxic respiratory failure secondary to COVID-19 infection and possible pneumonia. Patient on salter high flow nasal cannula 7 L this morning. I tried dialing him down to about 5 L. Continue to try to taper oxygen. Patient on remdesivir, Solu-Medrol and baricitinib. 2. Acute metabolic encephalopathy. Patient does have a history of stroke. Patient able to communicate better as per son. Patient on dysphagia diet. 3. Acute kidney injury on chronic kidney disease stage IIIa. Creatinine even better today at 1.33. Continue oral supplementation. 4. Chronic systolic congestive heart failure. Holding off on diuretic. Continue spironolactone and metoprolol. 5. Chronic atrial fibrillation on metoprolol, diltiazem and Eliquis 6. Weakness. Physical therapy evaluation 7. Staph species in 1 out of 4 blood culture bottles likely contamination 8. Type 2 diabetes mellitus with hemoglobin A1c of 7.2. Sugars elevated with steroids. Continue Levemir insulin and sliding scale. 9. History of completed ischemic stroke which was nonhemorrhagic on Eliquis for anticoagulation 10. Hyperlipidemia unspecified on pravastatin 11. Sepsis ruled out        Code Status:     Code Status Orders   (From admission, onward)         Start     Ordered   09/08/20 2114  Full code  Continuous        09/08/20 2116        Code Status History  Date Active Date Inactive Code Status Order ID Comments User Context   06/27/2019 2216 06/30/2019 1905 Full Code [id number]  Arville CareMansy, Vernetta HoneyJan A, MD ED   03/13/2019 0133 03/14/2019 1550 Full Code [id number]  Hannah BeatMansy, Jan A, MD ED   01/24/2019 2354 01/25/2019 1904 Full Code [id number]  Mansy, Vernetta HoneyJan A, MD ED   Advance Care Planning Activity     Family Communication: Spoke with son at the bedside Disposition Plan: Status is: Inpatient  Dispo: The patient is from: Home              Anticipated d/c is to: Home              Anticipated d/c date is: Likely will need a few days here in the hospital              Patient currently still on salter high flow nasal cannula. Continue to titrate down.  Time spent: 28 minutes  Kimie Pidcock [employer]  [facility]

## 2020-09-11 DIAGNOSIS — J1282 Pneumonia due to coronavirus disease 2019: Secondary | ICD-10-CM

## 2020-09-11 DIAGNOSIS — D6869 Other thrombophilia: Secondary | ICD-10-CM

## 2020-09-11 LAB — FIBRIN DERIVATIVES D-DIMER (ARMC ONLY): Fibrin derivatives D-dimer (ARMC): 539.35 ng/mL (FEU) — ABNORMAL HIGH (ref 0.00–499.00)

## 2020-09-11 LAB — GLUCOSE, CAPILLARY
Glucose-Capillary: 386 mg/dL — ABNORMAL HIGH (ref 70–99)
Glucose-Capillary: 459 mg/dL — ABNORMAL HIGH (ref 70–99)
Glucose-Capillary: 437 mg/dL — ABNORMAL HIGH (ref 70–99)
Glucose-Capillary: 252 mg/dL — ABNORMAL HIGH (ref 70–99)
Glucose-Capillary: 264 mg/dL — ABNORMAL HIGH (ref 70–99)
Glucose-Capillary: 227 mg/dL — ABNORMAL HIGH (ref 70–99)

## 2020-09-11 LAB — COMPREHENSIVE METABOLIC PANEL
ALT: 41 U/L (ref 0–44)
AST: 73 U/L — ABNORMAL HIGH (ref 15–41)
Albumin: 3.5 g/dL (ref 3.5–5.0)
Alkaline Phosphatase: 69 U/L (ref 38–126)
Anion gap: 13 (ref 5–15)
BUN: 68 mg/dL — ABNORMAL HIGH (ref 8–23)
CO2: 25 mmol/L (ref 22–32)
Calcium: 9.2 mg/dL (ref 8.9–10.3)
Chloride: 102 mmol/L (ref 98–111)
Creatinine, Ser: 1.17 mg/dL (ref 0.61–1.24)
GFR, Estimated: >60 mL/min (ref >60)
Glucose, Bld: 316 mg/dL — ABNORMAL HIGH (ref 70–99)
Potassium: 4 mmol/L (ref 3.5–5.1)
Sodium: 140 mmol/L (ref 135–145)
Total Bilirubin: 1.1 mg/dL (ref 0.3–1.2)
Total Protein: 7.3 g/dL (ref 6.5–8.1)

## 2020-09-11 LAB — CBC WITH DIFFERENTIAL/PLATELET
Abs Immature Granulocytes: 0.16 10*3/uL — ABNORMAL HIGH (ref 0.00–0.07)
Basophils Absolute: 0 10*3/uL (ref 0.0–0.1)
Basophils Relative: 0 %
Eosinophils Absolute: 0 10*3/uL (ref 0.0–0.5)
Eosinophils Relative: 0 %
HCT: 32.1 % — ABNORMAL LOW (ref 39.0–52.0)
Hemoglobin: 10 g/dL — ABNORMAL LOW (ref 13.0–17.0)
Immature Granulocytes: 1 %
Lymphocytes Relative: 5 %
Lymphs Abs: 0.7 10*3/uL (ref 0.7–4.0)
MCH: 25.4 pg — ABNORMAL LOW (ref 26.0–34.0)
MCHC: 31.2 g/dL (ref 30.0–36.0)
MCV: 81.5 fL (ref 80.0–100.0)
Monocytes Absolute: 0.3 10*3/uL (ref 0.1–1.0)
Monocytes Relative: 2 %
Neutro Abs: 12.9 10*3/uL — ABNORMAL HIGH (ref 1.7–7.7)
Neutrophils Relative %: 92 %
Platelets: 103 10*3/uL — ABNORMAL LOW (ref 150–400)
RBC: 3.94 MIL/uL — ABNORMAL LOW (ref 4.22–5.81)
RDW: 13.7 % (ref 11.5–15.5)
WBC: 14 10*3/uL — ABNORMAL HIGH (ref 4.0–10.5)
nRBC: 0 % (ref 0.0–0.2)

## 2020-09-11 LAB — C-REACTIVE PROTEIN: CRP: 10.4 mg/dL — ABNORMAL HIGH (ref <1.0)

## 2020-09-11 LAB — URINE CULTURE: Culture: NO GROWTH

## 2020-09-11 LAB — PROCALCITONIN: Procalcitonin: <0.1 ng/mL

## 2020-09-11 LAB — CULTURE, BLOOD (ROUTINE X 2): Special Requests: ADEQUATE

## 2020-09-11 MED ORDER — PREDNISONE 50 MG PO TABS
50.0000 mg | ORAL_TABLET | Freq: Every day | ORAL | Status: DC
  Administered 2020-09-12: 50 mg via ORAL
  Filled 2020-09-11: qty 1

## 2020-09-11 MED ORDER — ADULT MULTIVITAMIN W/MINERALS CH
1.0000 | ORAL_TABLET | Freq: Every day | ORAL | Status: DC
  Administered 2020-09-12: 10:00:00 1 via ORAL
  Filled 2020-09-11: qty 1

## 2020-09-11 MED ORDER — INSULIN ASPART 100 UNIT/ML ~~LOC~~ SOLN
6.0000 [IU] | Freq: Three times a day (TID) | SUBCUTANEOUS | Status: DC
  Administered 2020-09-11 – 2020-09-12 (×3): 6 [IU] via SUBCUTANEOUS
  Filled 2020-09-11 (×3): qty 1

## 2020-09-11 MED ORDER — INSULIN DETEMIR 100 UNIT/ML ~~LOC~~ SOLN
14.0000 [IU] | Freq: Two times a day (BID) | SUBCUTANEOUS | Status: DC
  Administered 2020-09-11 (×2): 14 [IU] via SUBCUTANEOUS
  Filled 2020-09-11 (×3): qty 0.14

## 2020-09-11 MED ORDER — METHYLPREDNISOLONE SODIUM SUCC 40 MG IJ SOLR
40.0000 mg | Freq: Two times a day (BID) | INTRAMUSCULAR | Status: AC
  Administered 2020-09-11: 40 mg via INTRAVENOUS
  Filled 2020-09-11: qty 1

## 2020-09-11 MED ORDER — NEPRO/CARBSTEADY PO LIQD
237.0000 mL | Freq: Two times a day (BID) | ORAL | Status: DC
  Administered 2020-09-12 (×2): 237 mL via ORAL

## 2020-09-11 NOTE — TOC Initial Note (Signed)
Transition of Care [facility]) - Initial/Assessment Note    Patient Details  Name: Brian Baker MRN: [id number] Date of Birth: 1936-01-05  Transition of Care [facility]) CM/SW Contact:    Brian Butcher, RN Phone Number: 09/11/2020, 3:30 PM  Clinical Narrative:                 Patient admitted to the hospital with COVID currently requiring supplemental oxygen at 13 L HFNC.  RNCM was able to speak with patient's son, Brian Baker, via phone.  Patient and his wife live with Brian Baker.  Brian Baker reports that usually he and his brother take turns and patient will live with one son for a couple of months and then the other son the next 2 months and so on.  Patient walks with a cane at home and can feed himself but he requires assistance with bathing, toileting and dressing, patient is incontinent.   Families goal is to get patient back home, they are not interested in SNF and decline home health services.  They do agree to getting a RW and [employer] will deliver one to the room.   TOC will cont to follow for any additional needs.   Expected Discharge Plan: Home/Self Care Barriers to Discharge: Continued Medical Work up   Patient Goals and CMS Choice Patient states their goals for this hospitalization and ongoing recovery are:: Patient's son just wants to get the patient home as soon as possible      Expected Discharge Plan and Services Expected Discharge Plan: Home/Self Care   Discharge Planning Services: CM Consult   Living arrangements for the past 2 months: Single Family Home                 DME Arranged: Walker rolling DME Agency: [employer] Date DME Agency Contacted: 09/11/20 Time DME Agency Contacted: 1530 Representative spoke with at DME Agency: Brian Baker HH Arranged: Patient Refused HH          Prior Living Arrangements/Services Living arrangements for the past 2 months: Single Family Home Lives with:: Adult Children,Spouse Patient language and need for interpreter reviewed:: Yes (arabic) Do you feel safe  going back to the place where you live?: Yes      Need for Family Participation in Patient Care: Yes (Comment) (COVID) Care giver support system in place?: Yes (comment) (wife and son) Current home services: DME (cane) Criminal Activity/Legal Involvement Pertinent to Current Situation/Hospitalization: No - Comment as needed  Activities of Daily Living Home Assistive Devices/Equipment: Walker (specify type),Cane (specify quad or straight) ADL Screening (condition at time of admission) Patient's cognitive ability adequate to safely complete daily activities?: No Is the patient deaf or have difficulty hearing?: No Does the patient have difficulty seeing, even when wearing glasses/contacts?: No Does the patient have difficulty concentrating, remembering, or making decisions?: Yes Patient able to express need for assistance with ADLs?: Yes Does the patient have difficulty dressing or bathing?: Yes Independently performs ADLs?: No Communication: Independent Dressing (OT): Needs assistance Is this a change from baseline?: Pre-admission baseline Grooming: Needs assistance Is this a change from baseline?: Pre-admission baseline Feeding: Independent Bathing: Needs assistance Is this a change from baseline?: Pre-admission baseline Toileting: Needs assistance Is this a change from baseline?: Pre-admission baseline In/Out Bed: Needs assistance Is this a change from baseline?: Pre-admission baseline Walks in Home: Independent with device (comment) Does the patient have difficulty walking or climbing stairs?: Yes Weakness of Legs: Both Weakness of Arms/Hands: Both  Permission Sought/Granted Permission sought to share information with :  Case Manager,Family Supports Permission granted to share information with : Yes, Verbal Permission Granted  Share Information with NAME: Brian Baker     Permission granted to share info w Relationship: son     Emotional Assessment         Alcohol / Substance  Use: Not Applicable Psych Involvement: No (comment)  Admission diagnosis:  Acute hypoxemic respiratory failure due to COVID-19 (HCC) [U07.1, J96.01] Patient Active Problem List   Diagnosis Date Noted  . Acute metabolic encephalopathy   . Acute kidney injury superimposed on CKD (HCC)   . Weakness   . Impaired fasting glucose   . Acute hypoxemic respiratory failure due to COVID-19 (HCC) 09/08/2020  . Protein malnutrition (HCC) 09/08/2020  . Elevated troponin 06/28/2019  . Acute respiratory failure with hypoxia (HCC) 06/28/2019  . Stroke (HCC)   . Hyperlipemia   . Enlarged prostate   . GERD (gastroesophageal reflux disease)   . Hypokalemia   . Acute on chronic combined systolic and diastolic CHF (congestive heart failure) (HCC)   . Permanent atrial fibrillation (HCC)   . Chronic systolic heart failure (HCC) 02/05/2019  . HTN (hypertension) 02/05/2019  . Acute CHF (congestive heart failure) (HCC) 01/24/2019   PCP:  [facility] Pharmacy:   [id number] [place], [place] - [address] AT [facility] [address] [place] [address] Phone: [phone] Fax: [phone]     Social Determinants of Health (SDOH) Interventions    Readmission Risk Interventions No flowsheet data found.

## 2020-09-11 NOTE — Progress Notes (Signed)
Updated son Park Breed that patient is now on [facility] doing very well sitting up in bed feeding himself

## 2020-09-11 NOTE — Progress Notes (Signed)
PT Cancellation Note  Patient Details Name: Brian Baker MRN: [id number] DOB: 06/12/1936   Cancelled Treatment:    Reason Eval/Treat Not Completed: Other (comment) (Attempted treatment session, pt awake, appears comfortable. Attempted use of interpreter not successful.) Attempted speaker phone interpreter, pt reponds only ~25% of time, very soft spoken and slurred, not answering questions in an appropriate manner. Interpreter reports unable to really hear patient well. Attempted staff member to facilitate communication, pt again, interactive but not consistently responsive verbally, not consistently following commands. Will be sure to coordinate with family visitors for language assist in future PT sessions.    [name] C 09/11/2020, 5:14 PM

## 2020-09-11 NOTE — Progress Notes (Signed)
Initial Nutrition Assessment  DOCUMENTATION CODES:   Severe malnutrition in context of chronic illness  INTERVENTION:  Provide Nepro Shake po BID, each supplement provides 425 kcal and 19 grams protein.  Provide MVI po daily.  Monitor magnesium, potassium, and phosphorus daily for at least 3 days, MD to replete as needed, as pt is at risk for refeeding syndrome given severe malnutrition.  NUTRITION DIAGNOSIS:   Severe Malnutrition related to chronic illness (CHF, COPD, CKD) as evidenced by severe fat depletion,severe muscle depletion.  GOAL:   Patient will meet greater than or equal to 90% of their needs  MONITOR:   PO intake,Supplement acceptance,Labs,Weight trends,I & O's  REASON FOR ASSESSMENT:   Consult Assessment of nutrition requirement/status  ASSESSMENT:   85 year old male with PMHx of CHF, HTN, CVA, HLD, COPD, DM, A-fib admitted with VELFY-10, acute metabolic encephalopathy, AKI on CKD stage II.   Met with patient at bedside. Used [employer] video interpreter. The first interpreter attempted could not hear well due to fan in room. Finally able to utilize Arabic interpreter [phone]). She did have some difficulty hearing patient still. Patient is ordered for dysphagia 1 diet with thin liquids per SLP on 1/19. He reports he is tolerating well. He initially reported he was not eating well. Later in assessment he reported he is eating everything now. No meal documentation is being recorded in chart so difficult to tell how patient is really eating at meals. Patient unable to provide details on typical intake at baseline. Discussed importance of adequate calorie and protein intake with COVID. Patient amenable to drinking ONS to help meet calorie/protein needs.  Patient is unsure of his UBW at baseline but denies any weight loss. Per review of chart patient was 61.9-72.6 kg in 2020. Patient now documented to be 72.6 kg (160 lbs) but suspect this was reported and not truly measured  as patient appears to weigh less than this on assessment.  Medications reviewed and include: Novolog 0-9 units Tid, Novolog 0-5 units QHS, Novolog 6 units TID, Levemir 14 units BID, solu-medrol 40 mg Q12hrs IV, Protonix, remdesivir.   Labs reviewed: CBG 252-459, BUN 68.  NUTRITION - FOCUSED PHYSICAL EXAM:  Flowsheet Row Most Recent Value  Orbital Region Severe depletion  Upper Arm Region Severe depletion  Thoracic and Lumbar Region Moderate depletion  Buccal Region Severe depletion  Temple Region Severe depletion  Clavicle Bone Region Severe depletion  Clavicle and Acromion Bone Region Severe depletion  Scapular Bone Region Moderate depletion  Dorsal Hand Moderate depletion  Patellar Region Severe depletion  Anterior Thigh Region Severe depletion  Posterior Calf Region Severe depletion  Edema (RD Assessment) None  Hair Reviewed  Eyes Reviewed  Mouth Reviewed  Skin Reviewed  Nails Reviewed     Diet Order:   Diet Order            DIET - DYS 1 Room service appropriate? Yes; Fluid consistency: Thin  Diet effective now                EDUCATION NEEDS:   No education needs have been identified at this time  Skin:  Skin Assessment: Reviewed RN Assessment  Last BM:  09/11/2020 - large type 2  Height:   Ht Readings from Last 1 Encounters:  09/10/20 _0  (1.702 m)   Weight:   Wt Readings from Last 1 Encounters:  09/10/20 72.6 kg   BMI:  Body mass index is 25.07 kg/m.  Estimated Nutritional Needs:   Kcal:  1800-2000  Protein:  90-100 grams  Fluid:  1.8-2 L/day  Jacklynn Barnacle, MS, RD, LDN Pager number available on Amion

## 2020-09-11 NOTE — Progress Notes (Signed)
Patient ID: Brian Baker, male   DOB: 1936/01/22, 85 y.o.   MRN: [id number] [facility] PROGRESS NOTE  Kim Lauver YQM:[id number] DOB: [birth year] DOA: 09/08/2020 PCP: [facility]  HPI/Subjective: Patient doing better as per son at the bedside.  He is able to communicate better.  He is eating better.  No complaints of shortness of breath or chest pain or abdominal pain.  Objective: Vitals:   09/11/20 0800 09/11/20 1100  BP: 119/61 (!) 142/70  Pulse: 84   Resp: 16 16  Temp: 97.7 F (36.5 C) 98 F (36.7 C)  SpO2: 95%     Intake/Output Summary (Last 24 hours) at 09/11/2020 1521 Last data filed at 09/11/2020 0423 Gross per 24 hour  Intake 196.95 ml  Output 125 ml  Net 71.95 ml   Filed Weights   09/08/20 2004 09/10/20 2107  Weight: 72.6 kg 72.6 kg    ROS: Review of Systems  Respiratory: Negative for shortness of breath.   Cardiovascular: Negative for chest pain.  Gastrointestinal: Negative for abdominal pain.   Exam: Physical Exam HENT:     Head: Normocephalic.     Mouth/Throat:     Pharynx: No oropharyngeal exudate.  Eyes:     General: Lids are normal.     Conjunctiva/sclera: Conjunctivae normal.  Cardiovascular:     Rate and Rhythm: Normal rate and regular rhythm.     Heart sounds: Normal heart sounds, S1 normal and S2 normal.  Pulmonary:     Breath sounds: Examination of the right-lower field reveals decreased breath sounds and wheezing. Examination of the left-lower field reveals decreased breath sounds and wheezing. Decreased breath sounds and wheezing present. No rhonchi or rales.  Abdominal:     Palpations: Abdomen is soft.     Tenderness: There is no abdominal tenderness.  Musculoskeletal:     Right lower leg: No swelling.     Left lower leg: No swelling.  Skin:    General: Skin is warm.     Findings: No rash.  Neurological:     Mental Status: He is alert.     Comments: Answers questions and follows commands.       Data Reviewed: Basic  Metabolic Panel: Recent Labs  Lab 09/08/20 1951 09/09/20 0557 09/10/20 0408 09/11/20 0522  NA 138 142 148* 140  K 3.9 4.5 4.0 4.0  CL 101 101 107 102  CO2 25 25 29 25   GLUCOSE 312* 369* 348* 316*  BUN 68* 59* 66* 68*  CREATININE 1.58* 1.51* 1.33* 1.17  CALCIUM 8.7* 8.7* 8.9 9.2   Liver Function Tests: Recent Labs  Lab 09/08/20 1951 09/09/20 0557 09/10/20 0408 09/11/20 0522  AST 27 28 30  73*  ALT 15 16 17  41  ALKPHOS 59 63 65 69  BILITOT 1.5* 1.2 1.0 1.1  PROT 7.4 7.3 7.5 7.3  ALBUMIN 3.6 3.4* 3.4* 3.5   CBC: Recent Labs  Lab 09/08/20 1951 09/09/20 0557 09/10/20 0408 09/11/20 0522  WBC 5.8 5.8 7.8 14.0*  NEUTROABS 4.6 4.9 6.7 12.9*  HGB 10.4* 10.2* 10.8* 10.0*  HCT 32.5* 32.4* 35.3* 32.1*  MCV 79.9* 81.6 82.9 81.5  PLT 58* 53* 72* 103*    CBG: Recent Labs  Lab 09/10/20 1234 09/10/20 1629 09/10/20 2103 09/11/20 0833 09/11/20 1201  GLUCAP 242* 271* 386* 459* 437*    Recent Results (from the past 240 hour(s))  Blood Culture (routine x 2)     Status: None (Preliminary result)   Collection Time: 09/08/20  7:51 PM  Specimen: BLOOD  Result Value Ref Range Status   Specimen Description BLOOD BLOOD RIGHT FOREARM  Final   Special Requests   Final    BOTTLES DRAWN AEROBIC AND ANAEROBIC Blood Culture results may not be optimal due to an excessive volume of blood received in culture bottles   Culture   Final    NO GROWTH 3 DAYS Performed at [facility], [address], [place], [place] [address]    Report Status PENDING  Incomplete  Blood Culture (routine x 2)     Status: Abnormal   Collection Time: 09/08/20  7:51 PM   Specimen: BLOOD  Result Value Ref Range Status   Specimen Description BLOOD LEFT ANTECUBITAL  Final   Special Requests   Final    BOTTLES DRAWN AEROBIC AND ANAEROBIC Blood Culture adequate volume   Culture  Setup Time   Final    GRAM POSITIVE COCCI AEROBIC BOTTLE ONLY CRITICAL RESULT CALLED TO, READ BACK BY AND VERIFIED  WITH: SCOTT HALL AT 0623 09/10/20 SDR    Culture (A)  Final    STAPHYLOCOCCUS CAPITIS THE SIGNIFICANCE OF ISOLATING THIS ORGANISM FROM A SINGLE SET OF BLOOD CULTURES WHEN MULTIPLE SETS ARE DRAWN IS UNCERTAIN. PLEASE NOTIFY THE MICROBIOLOGY DEPARTMENT WITHIN ONE WEEK IF SPECIATION AND SENSITIVITIES ARE REQUIRED.    Report Status 09/11/2020 FINAL  Final  Blood Culture ID Panel (Reflexed)     Status: Abnormal   Collection Time: 09/08/20  7:51 PM  Result Value Ref Range Status   Enterococcus faecalis NOT DETECTED NOT DETECTED Final   Enterococcus Faecium NOT DETECTED NOT DETECTED Final   Listeria monocytogenes NOT DETECTED NOT DETECTED Final   Staphylococcus species DETECTED (A) NOT DETECTED Final    Comment: CRITICAL RESULT CALLED TO, READ BACK BY AND VERIFIED WITH:  SCOTT HALL AT 0526 09/10/20 SDR    Staphylococcus aureus (BCID) NOT DETECTED NOT DETECTED Final   Staphylococcus epidermidis NOT DETECTED NOT DETECTED Final   Staphylococcus lugdunensis NOT DETECTED NOT DETECTED Final   Streptococcus species NOT DETECTED NOT DETECTED Final   Streptococcus agalactiae NOT DETECTED NOT DETECTED Final   Streptococcus pneumoniae NOT DETECTED NOT DETECTED Final   Streptococcus pyogenes NOT DETECTED NOT DETECTED Final   A.calcoaceticus-baumannii NOT DETECTED NOT DETECTED Final   Bacteroides fragilis NOT DETECTED NOT DETECTED Final   Enterobacterales NOT DETECTED NOT DETECTED Final   Enterobacter cloacae complex NOT DETECTED NOT DETECTED Final   Escherichia coli NOT DETECTED NOT DETECTED Final   Klebsiella aerogenes NOT DETECTED NOT DETECTED Final   Klebsiella oxytoca NOT DETECTED NOT DETECTED Final   Klebsiella pneumoniae NOT DETECTED NOT DETECTED Final   Proteus species NOT DETECTED NOT DETECTED Final   Salmonella species NOT DETECTED NOT DETECTED Final   Serratia marcescens NOT DETECTED NOT DETECTED Final   Haemophilus influenzae NOT DETECTED NOT DETECTED Final   Neisseria meningitidis NOT  DETECTED NOT DETECTED Final   Pseudomonas aeruginosa NOT DETECTED NOT DETECTED Final   Stenotrophomonas maltophilia NOT DETECTED NOT DETECTED Final   Candida albicans NOT DETECTED NOT DETECTED Final   Candida auris NOT DETECTED NOT DETECTED Final   Candida glabrata NOT DETECTED NOT DETECTED Final   Candida krusei NOT DETECTED NOT DETECTED Final   Candida parapsilosis NOT DETECTED NOT DETECTED Final   Candida tropicalis NOT DETECTED NOT DETECTED Final   Cryptococcus neoformans/gattii NOT DETECTED NOT DETECTED Final    Comment: Performed at [facility], [address] [place], [place] [address]  Urine culture  Status: None   Collection Time: 09/08/20  9:50 PM   Specimen: Urine, Random  Result Value Ref Range Status   Specimen Description   Final    URINE, RANDOM Performed at [facility], [address] [place], [place] [address]    Special Requests   Final    NONE Performed at [facility], [address] [place], [place] [address]    Culture   Final    NO GROWTH Performed at [facility], [address] [place], [place] [address]    Report Status 09/11/2020 FINAL  Final     Scheduled Meds: . albuterol  2 puff Inhalation Q4H  . apixaban  5 mg Oral BID  . baricitinib  2 mg Oral Daily  . budesonide  2 puff Inhalation BID  . diltiazem  180 mg Oral Daily  . insulin aspart  0-5 Units Subcutaneous QHS  . insulin aspart  0-9 Units Subcutaneous TID WC  . insulin aspart  6 Units Subcutaneous TID WC  . insulin detemir  14 Units Subcutaneous BID  . methylPREDNISolone (SOLU-MEDROL) injection  1 mg/kg Intravenous Q12H   Followed by  . [START ON 09/12/2020] predniSONE  50 mg Oral Daily  . metoprolol tartrate  50 mg Oral BID  . pantoprazole  40 mg Oral q1800  . pneumococcal 23 valent vaccine  0.5 mL Intramuscular Tomorrow-1000  . pravastatin  40 mg Oral q1800  . spironolactone  12.5 mg Oral Daily   Continuous Infusions: . remdesivir 100 mg  in NS 100 mL 100 mg (09/11/20 0907)    Assessment/Plan:  1. Acute hypoxic respiratory failure secondary to COVID-19 infection possible pneumonia.  Patient on salter high flow nasal cannula 13 L this morning.  Continue Solu-Medrol but decrease dose to 40 mg twice daily.  Continue remdesivir and baricitinib.  Nursing staff to speak with him about incentive spirometer and flutter valve.  I did speak with the son about lying on his belly and sitting up and working with physical therapy. 2. Acute metabolic encephalopathy likely from fever when he came in.  He does have a history of stroke.  Patient doing better with his mental status as per son. 3. Acute kidney injury on chronic kidney disease stage II.  Creatinine 1.58 on presentation down to 1.17 today. 4. Chronic atrial fibrillation on metoprolol and diltiazem for rate control and Eliquis for stroke prevention 5. Chronic systolic congestive heart failure.  Continue to hold off on diuretic.  Continue metoprolol and spironolactone.  No signs of heart failure currently. 6. Staph capitis in 1 bottle.  Likely contamination 7. Type 2 diabetes mellitus with hemoglobin A1c at 7.2.  Sugars elevated into the 400s today we will have to decrease steroid dose down to 40 mg twice daily.  I increase Levemir insulin and sliding scale insulin today. 8. History of completed stroke which was nonhemorrhagic.  Continue Eliquis for anticoagulation 9. Acquired thrombophilia secondary to atrial fibrillation.  Eliquis to reduce stroke risk. 10. Hyperlipidemia unspecified on pravastatin        Code Status:     Code Status Orders  (From admission, onward)         Start     Ordered   09/08/20 2114  Full code  Continuous        09/08/20 2116        Code Status History    Date Active Date Inactive Code Status Order ID Comments User Context   06/27/2019 2216 06/30/2019  1905 Full Code [id number]  Hannah Beat, MD ED   03/13/2019 0133 03/14/2019 1550 Full Code  [id number]  Hannah Beat, MD ED   01/24/2019 2354 01/25/2019 1904 Full Code [id number]  Mansy, Vernetta Honey, MD ED   Advance Care Planning Activity     Family Communication: Spoke with son at the bedside Disposition Plan: Status is: Inpatient  Dispo: The patient is from: Home              Anticipated d/c is to: Home              Anticipated d/c date is: Likely will need another 4 days here in the hospital secondary to being on salter high flow nasal cannula              Patient currently not medically stable for discharge secondary to being on 13 L salter high flow nasal cannula and being treated for COVID-19 pneumonia.   Difficult to place patient: No, because family is interested in taking home.  If they decide on rehab then likely will be difficult to place.  Time spent: 29 minutes, patient's son used as Nurse, learning disability because he did not do well with translator on a stick.  Enedelia Martorelli [employer]  [facility] [employer]

## 2020-09-11 NOTE — Progress Notes (Signed)
fsbs = [phone], notified MD and ordered stat lab redraw per protocol

## 2020-09-12 DIAGNOSIS — J9621 Acute and chronic respiratory failure with hypoxia: Secondary | ICD-10-CM

## 2020-09-12 DIAGNOSIS — E1169 Type 2 diabetes mellitus with other specified complication: Secondary | ICD-10-CM

## 2020-09-12 DIAGNOSIS — I482 Chronic atrial fibrillation, unspecified: Secondary | ICD-10-CM

## 2020-09-12 DIAGNOSIS — E43 Unspecified severe protein-calorie malnutrition: Secondary | ICD-10-CM

## 2020-09-12 LAB — CBC WITH DIFFERENTIAL/PLATELET
Abs Immature Granulocytes: 0.54 10*3/uL — ABNORMAL HIGH (ref 0.00–0.07)
Basophils Absolute: 0 10*3/uL (ref 0.0–0.1)
Basophils Relative: 0 %
Eosinophils Absolute: 0 10*3/uL (ref 0.0–0.5)
Eosinophils Relative: 0 %
HCT: 32.2 % — ABNORMAL LOW (ref 39.0–52.0)
Hemoglobin: 10.3 g/dL — ABNORMAL LOW (ref 13.0–17.0)
Immature Granulocytes: 4 %
Lymphocytes Relative: 6 %
Lymphs Abs: 0.8 10*3/uL (ref 0.7–4.0)
MCH: 25.6 pg — ABNORMAL LOW (ref 26.0–34.0)
MCHC: 32 g/dL (ref 30.0–36.0)
MCV: 80.1 fL (ref 80.0–100.0)
Monocytes Absolute: 0.4 10*3/uL (ref 0.1–1.0)
Monocytes Relative: 3 %
Neutro Abs: 12.2 10*3/uL — ABNORMAL HIGH (ref 1.7–7.7)
Neutrophils Relative %: 87 %
Platelets: 166 10*3/uL (ref 150–400)
RBC: 4.02 MIL/uL — ABNORMAL LOW (ref 4.22–5.81)
RDW: 13.6 % (ref 11.5–15.5)
WBC: 13.9 10*3/uL — ABNORMAL HIGH (ref 4.0–10.5)
nRBC: 0 % (ref 0.0–0.2)

## 2020-09-12 LAB — COMPREHENSIVE METABOLIC PANEL
ALT: 52 U/L — ABNORMAL HIGH (ref 0–44)
AST: 69 U/L — ABNORMAL HIGH (ref 15–41)
Albumin: 3.8 g/dL (ref 3.5–5.0)
Alkaline Phosphatase: 68 U/L (ref 38–126)
Anion gap: 10 (ref 5–15)
BUN: 64 mg/dL — ABNORMAL HIGH (ref 8–23)
CO2: 27 mmol/L (ref 22–32)
Calcium: 9.8 mg/dL (ref 8.9–10.3)
Chloride: 108 mmol/L (ref 98–111)
Creatinine, Ser: 1.14 mg/dL (ref 0.61–1.24)
GFR, Estimated: >60 mL/min (ref >60)
Glucose, Bld: 71 mg/dL (ref 70–99)
Potassium: 3.7 mmol/L (ref 3.5–5.1)
Sodium: 145 mmol/L (ref 135–145)
Total Bilirubin: 1.1 mg/dL (ref 0.3–1.2)
Total Protein: 7.6 g/dL (ref 6.5–8.1)

## 2020-09-12 LAB — MAGNESIUM: Magnesium: 2.4 mg/dL (ref 1.7–2.4)

## 2020-09-12 LAB — PHOSPHORUS: Phosphorus: 2 mg/dL — ABNORMAL LOW (ref 2.5–4.6)

## 2020-09-12 LAB — FIBRIN DERIVATIVES D-DIMER (ARMC ONLY): Fibrin derivatives D-dimer (ARMC): 456.06 ng/mL (FEU) (ref 0.00–499.00)

## 2020-09-12 LAB — GLUCOSE, CAPILLARY: Glucose-Capillary: 337 mg/dL — ABNORMAL HIGH (ref 70–99)

## 2020-09-12 LAB — C-REACTIVE PROTEIN: CRP: 6.4 mg/dL — ABNORMAL HIGH (ref <1.0)

## 2020-09-12 MED ORDER — BLOOD GLUCOSE MONITOR KIT: PACK | 0 refills | Status: AC

## 2020-09-12 MED ORDER — INSULIN DETEMIR 100 UNIT/ML ~~LOC~~ SOLN
10.0000 [IU] | Freq: Two times a day (BID) | SUBCUTANEOUS | Status: DC
  Administered 2020-09-12: 10 [IU] via SUBCUTANEOUS
  Filled 2020-09-12 (×3): qty 0.1

## 2020-09-12 MED ORDER — TRAZODONE HCL 50 MG PO TABS: 50.0000 mg | ORAL_TABLET | Freq: Every evening | ORAL | 0 refills | Status: AC | PRN

## 2020-09-12 MED ORDER — PREDNISONE 20 MG PO TABS: ORAL_TABLET | ORAL | 0 refills | Status: AC

## 2020-09-12 MED ORDER — ALBUTEROL SULFATE HFA 108 (90 BASE) MCG/ACT IN AERS: 2.0000 | INHALATION_SPRAY | Freq: Four times a day (QID) | RESPIRATORY_TRACT | 0 refills | Status: AC | PRN

## 2020-09-12 MED ORDER — GLIPIZIDE 5 MG PO TABS: ORAL_TABLET | ORAL | 0 refills | Status: AC

## 2020-09-12 MED ORDER — NEPRO/CARBSTEADY PO LIQD: 237.0000 mL | Freq: Two times a day (BID) | ORAL | 0 refills | Status: AC

## 2020-09-12 MED ORDER — FUROSEMIDE 20 MG PO TABS: 20.0000 mg | ORAL_TABLET | Freq: Every day | ORAL | 0 refills | Status: AC | PRN

## 2020-09-12 MED ORDER — TRAZODONE HCL 50 MG PO TABS: 50.0000 mg | ORAL_TABLET | Freq: Every day | ORAL | Status: DC

## 2020-09-12 NOTE — Discharge Summary (Signed)
[facility] [place] NAME: Brian Baker    MR#:  [id number]  DATE OF BIRTH:  April 18, 1936  DATE OF ADMISSION:  09/08/2020 ADMITTING PHYSICIAN: Amy N Cox, DO  DATE OF DISCHARGE: 09/12/2020  4:19 PM  PRIMARY CARE PHYSICIAN: Dr Herma Ard    ADMISSION DIAGNOSIS:  Acute hypoxemic respiratory failure due to COVID-19 ([place]) [U07.1, J96.01]  DISCHARGE DIAGNOSIS:  Principal Problem:   Pneumonia due to COVID-19 virus Active Problems:   Chronic systolic heart failure (HCC)   HTN (hypertension)   Permanent atrial fibrillation (HCC)   Stroke (HCC)   Hyperlipemia   Protein malnutrition (HCC)   Acquired thrombophilia (HCC)   Protein-calorie malnutrition, severe   SECONDARY DIAGNOSIS:   Past Medical History:  Diagnosis Date  . Arrhythmia    atrial fibrillation  . CHF (congestive heart failure) ([place])   . COPD (chronic obstructive pulmonary disease) ([place])   . Enlarged prostate   . Hyperlipemia   . Hypertension   . Stroke [facility])     HOSPITAL COURSE:   1.  Acute on chronic hypoxic respiratory failure.  COVID-19 pneumonia.  The patient was on salter high flow nasal cannula 13 L at his maximum and tapered down to 2 L of oxygen.  The patient does have oxygen at home that he wears as needed.  With ambulation today he required 2 L of oxygen to keep his saturations above 88%.  Recommend wearing oxygen 24/7.  The patient was prescribed remdesivir Solu-Medrol and baricitinib while here.  Patient will be discharged home on prednisone. 2.  Acute metabolic encephalopathy likely secondary to fever and COVID-19 infection.  He does have a history of a stroke.  As per son the patient's mental status is better than when he came in. 3.  Acute kidney injury on chronic kidney disease stage II.  Creatinine was 1.58 on presentation and down to 1.14 upon discharge.  Patient's Lasix was held during the entire hospitalization and can go on it as needed as outpatient. 4.  Chronic  atrial fibrillation on metoprolol and diltiazem for rate control and Eliquis for stroke prevention. 5.  Acquired thrombophilia continue Eliquis for anticoagulation.  Because of his history of atrial fibrillation he is at a higher increased risk of stroke. 6.  Chronic systolic congestive heart failure.  Patient's diuretic was held during the entire hospital stay.  Continue metoprolol and spironolactone.  Holding off ACE inhibitor with the acute kidney injury on presentation.  No signs of heart failure currently.  Can go back on as needed Lasix at home. 7.  Staph In 1 bottle which is a contamination. 8.  Type 2 diabetes mellitus with hyperlipidemia.  Continue pravastatin.  hemoglobin A1c of 7.2.  Sugars elevated with steroids.  I needed to give Levemir insulin while here in the hospital.  With prescribing steroids at home I will give Glucotrol XL for the time that I am giving steroids.  Can go on diet control after that. 9.  History of completed stroke which was nonhemorrhagic.  Continue Eliquis for anticoagulation 10.  Physical therapy recommended rehab.  Patient's son wanted to take the patient home as soon as possible.  He did not want rehab.  Also refused home health.  The patient actually ambulated down the hallway by himself and the nursing staff had to get him and bring him back to his room. 11.  Insomnia did not sleep last night likely secondary to being in the hospital and being sick and  in an unfamiliar environment and possibly steroids.  Trazodone as needed at night 12. severe protein calorie malnutrition   DISCHARGE CONDITIONS:   Satisfactory  CONSULTS OBTAINED:  None  DRUG ALLERGIES:  No Known Allergies  DISCHARGE MEDICATIONS:   Allergies as of 09/12/2020   No Known Allergies     Medication List    STOP taking these medications   lisinopril 20 MG tablet Commonly known as: ZESTRIL     TAKE these medications   albuterol 108 (90 Base) MCG/ACT inhaler Commonly known as:  VENTOLIN HFA Inhale 2 puffs into the lungs every 6 (six) hours as needed for wheezing or shortness of breath.   apixaban 5 MG Tabs tablet Commonly known as: Eliquis Take 1 tablet (5 mg total) by mouth 2 (two) times daily.   blood glucose meter kit and supplies Kit Dispense based on patient and insurance preference. Use up to four times daily as directed. (FOR ICD-9 250.00, 250.01).   diltiazem 180 MG 24 hr capsule Commonly known as: CARDIZEM CD Take 180 mg by mouth daily.   feeding supplement (NEPRO CARB STEADY) Liqd Take 237 mLs by mouth 2 (two) times daily between meals.   furosemide 20 MG tablet Commonly known as: Lasix Take 1 tablet (20 mg total) by mouth daily as needed for fluid or edema (shortness of breath). What changed:  medication strength how much to take when to take this reasons to take this   glipiZIDE 5 MG tablet Commonly known as: Glucotrol One tab po daily while taking steroids (prednisone) Start taking on: September 13, 2020   metoprolol tartrate 50 MG tablet Commonly known as: LOPRESSOR Take 1 tablet (50 mg total) by mouth 2 (two) times daily.   omeprazole 20 MG capsule Commonly known as: PRILOSEC Take 20 mg by mouth daily.   pravastatin 40 MG tablet Commonly known as: PRAVACHOL TK 1 T PO HS   predniSONE 20 MG tablet Commonly known as: DELTASONE Two tabs po daily for six days Start taking on: September 13, 2020   spironolactone 25 MG tablet Commonly known as: ALDACTONE Take 0.5 tablets (12.5 mg total) by mouth daily.   tamsulosin 0.4 MG Caps capsule Commonly known as: FLOMAX Take 0.4 mg by mouth daily.   traZODone 50 MG tablet Commonly known as: DESYREL Take 1 tablet (50 mg total) by mouth at bedtime as needed for sleep.            Durable Medical Equipment  (From admission, onward)         Start     Ordered   09/12/20 1316  For home use only DME oxygen  Once       Question Answer Comment  Length of Need Lifetime   Mode or  (Route) Nasal cannula   Liters per Minute 2   Frequency Continuous (stationary and portable oxygen unit needed)   Oxygen conserving device Yes   Oxygen delivery system Gas      09/12/20 1315   09/11/20 1527  For home use only DME Walker rolling  Once       Question Answer Comment  Walker: With [place] Wheels   Patient needs a walker to treat with the following condition COVID-19      09/11/20 1527           DISCHARGE INSTRUCTIONS:   Follow-up with your medical doctor 1 week  If you experience worsening of your admission symptoms, develop shortness of breath, life threatening emergency, suicidal or homicidal thoughts  you must seek medical attention immediately by calling 911 or calling your MD immediately  if symptoms less severe.  You Must read complete instructions/literature along with all the possible adverse reactions/side effects for all the Medicines you take and that have been prescribed to you. Take any new Medicines after you have completely understood and accept all the possible adverse reactions/side effects.   Please note  You were cared for by a hospitalist during your hospital stay. If you have any questions about your discharge medications or the care you received while you were in the hospital after you are discharged, you can call the unit and asked to speak with the hospitalist on call if the hospitalist that took care of you is not available. Once you are discharged, your primary care physician will handle any further medical issues. Please note that NO REFILLS for any discharge medications will be authorized once you are discharged, as it is imperative that you return to your primary care physician (or establish a relationship with a primary care physician if you do not have one) for your aftercare needs so that they can reassess your need for medications and monitor your lab values.    Today   CHIEF COMPLAINT:   Chief Complaint  Patient presents with  .  Shortness of Breath    Covid positive via rapid home test , alt loc  w/fever     HISTORY OF PRESENT ILLNESS:  Kayo Zion  is a 85 y.o. male came in with fever and altered mental status   VITAL SIGNS:  Blood pressure (!) 155/90, pulse (!) 51, temperature 98.2 F (36.8 C), resp. rate 18, height _0  (1.702 m), weight 72.6 kg, SpO2 90 %.  I/O:    Intake/Output Summary (Last 24 hours) at 09/12/2020 1634 Last data filed at 09/11/2020 2200 Gross per 24 hour  Intake 240 ml  Output --  Net 240 ml    PHYSICAL EXAMINATION:  GENERAL:  85 y.o.-year-old patient lying in the bed with no acute distress.  EYES: Pupils equal, round, reactive to light and accommodation. No scleral icterus. HEENT: Head atraumatic, normocephalic. Oropharynx and nasopharynx clear.  LUNGS: Normal breath sounds bilaterally, no wheezing, rales,rhonchi or crepitation. No use of accessory muscles of respiration.  CARDIOVASCULAR: S1, S2 normal. No murmurs, rubs, or gallops.  ABDOMEN: Soft, non-tender, non-distended. Bowel sounds present. No organomegaly or mass.  EXTREMITIES: No pedal edema.  NEUROLOGIC: Patient able to straight leg raise bilaterally.  PSYCHIATRIC: The patient is alert and answers some questions that his son asks.  SKIN: No obvious rash, lesion, or ulcer.   DATA REVIEW:   CBC Recent Labs  Lab 09/12/20 0554  WBC 13.9*  HGB 10.3*  HCT 32.2*  PLT 166    Chemistries  Recent Labs  Lab 09/12/20 0554  NA 145  K 3.7  CL 108  CO2 27  GLUCOSE 71  BUN 64*  CREATININE 1.14  CALCIUM 9.8  MG 2.4  AST 69*  ALT 52*  ALKPHOS 68  BILITOT 1.1    Microbiology Results  Results for orders placed or performed during the hospital encounter of 09/08/20  Blood Culture (routine x 2)     Status: None (Preliminary result)   Collection Time: 09/08/20  7:51 PM   Specimen: BLOOD  Result Value Ref Range Status   Specimen Description BLOOD BLOOD RIGHT FOREARM  Final   Special Requests   Final    BOTTLES  DRAWN AEROBIC AND ANAEROBIC Blood Culture results may  not be optimal due to an excessive volume of blood received in culture bottles   Culture   Final    NO GROWTH 4 DAYS Performed at [facility], [place] [address] [place], [place] [address]    Report Status PENDING  Incomplete  Blood Culture (routine x 2)     Status: Abnormal   Collection Time: 09/08/20  7:51 PM   Specimen: BLOOD  Result Value Ref Range Status   Specimen Description BLOOD LEFT ANTECUBITAL  Final   Special Requests   Final    BOTTLES DRAWN AEROBIC AND ANAEROBIC Blood Culture adequate volume   Culture  Setup Time   Final    GRAM POSITIVE COCCI AEROBIC BOTTLE ONLY CRITICAL RESULT CALLED TO, READ BACK BY AND VERIFIED WITH: SCOTT HALL AT 1751 09/10/20 SDR    Culture (A)  Final    STAPHYLOCOCCUS CAPITIS THE SIGNIFICANCE OF ISOLATING THIS ORGANISM FROM A SINGLE SET OF BLOOD CULTURES WHEN MULTIPLE SETS ARE DRAWN IS UNCERTAIN. PLEASE NOTIFY THE MICROBIOLOGY DEPARTMENT WITHIN ONE WEEK IF SPECIATION AND SENSITIVITIES ARE REQUIRED.    Report Status 09/11/2020 FINAL  Final  Blood Culture ID Panel (Reflexed)     Status: Abnormal   Collection Time: 09/08/20  7:51 PM  Result Value Ref Range Status   Enterococcus faecalis NOT DETECTED NOT DETECTED Final   Enterococcus Faecium NOT DETECTED NOT DETECTED Final   Listeria monocytogenes NOT DETECTED NOT DETECTED Final   Staphylococcus species DETECTED (A) NOT DETECTED Final    Comment: CRITICAL RESULT CALLED TO, READ BACK BY AND VERIFIED WITH:  SCOTT HALL AT 0526 09/10/20 SDR    Staphylococcus aureus (BCID) NOT DETECTED NOT DETECTED Final   Staphylococcus epidermidis NOT DETECTED NOT DETECTED Final   Staphylococcus lugdunensis NOT DETECTED NOT DETECTED Final   Streptococcus species NOT DETECTED NOT DETECTED Final   Streptococcus agalactiae NOT DETECTED NOT DETECTED Final   Streptococcus pneumoniae NOT DETECTED NOT DETECTED Final   Streptococcus pyogenes NOT DETECTED NOT  DETECTED Final   A.calcoaceticus-baumannii NOT DETECTED NOT DETECTED Final   Bacteroides fragilis NOT DETECTED NOT DETECTED Final   Enterobacterales NOT DETECTED NOT DETECTED Final   Enterobacter cloacae complex NOT DETECTED NOT DETECTED Final   Escherichia coli NOT DETECTED NOT DETECTED Final   Klebsiella aerogenes NOT DETECTED NOT DETECTED Final   Klebsiella oxytoca NOT DETECTED NOT DETECTED Final   Klebsiella pneumoniae NOT DETECTED NOT DETECTED Final   Proteus species NOT DETECTED NOT DETECTED Final   Salmonella species NOT DETECTED NOT DETECTED Final   Serratia marcescens NOT DETECTED NOT DETECTED Final   Haemophilus influenzae NOT DETECTED NOT DETECTED Final   Neisseria meningitidis NOT DETECTED NOT DETECTED Final   Pseudomonas aeruginosa NOT DETECTED NOT DETECTED Final   Stenotrophomonas maltophilia NOT DETECTED NOT DETECTED Final   Candida albicans NOT DETECTED NOT DETECTED Final   Candida auris NOT DETECTED NOT DETECTED Final   Candida glabrata NOT DETECTED NOT DETECTED Final   Candida krusei NOT DETECTED NOT DETECTED Final   Candida parapsilosis NOT DETECTED NOT DETECTED Final   Candida tropicalis NOT DETECTED NOT DETECTED Final   Cryptococcus neoformans/gattii NOT DETECTED NOT DETECTED Final    Comment: Performed at [facility], [address] [place], [place] [address]  Urine culture     Status: None   Collection Time: 09/08/20  9:50 PM   Specimen: Urine, Random  Result Value Ref Range Status   Specimen Description   Final    URINE, RANDOM Performed at [facility], [address], [place], [place] [address]    Special Requests   Final    NONE Performed at [facility], [address] [place], [place] [address]    Culture   Final    NO GROWTH Performed at [facility], [place] [address] [place], [place] [address]    Report Status 09/11/2020 FINAL  Final     Management plans discussed with the patient, family and they are in  agreement.  CODE STATUS:     Code Status Orders  (From admission, onward)         Start     Ordered   09/08/20 2114  Full code  Continuous        09/08/20 2116        Code Status History    Date Active Date Inactive Code Status Order ID Comments User Context   06/27/2019 2216 06/30/2019 1905 Full Code [id number]  Sidney Ace Arvella Merles, MD ED   03/13/2019 0133 03/14/2019 1550 Full Code [id number]  Sidney Ace Arvella Merles, MD ED   01/24/2019 2354 01/25/2019 1904 Full Code [id number]  Mansy, Arvella Merles, MD ED   Advance Care Planning Activity      TOTAL TIME TAKING CARE OF THIS PATIENT: 35 minutes.    Loletha Grayer M.D on 09/12/2020 at 4:34 PM  Between 7am to 6pm - Pager - [phone]  After 6pm go to [email or web address] - password [email or web address]  [facility]  CC: Primary care physician; Dr. Herma Ard

## 2020-09-12 NOTE — Progress Notes (Signed)
Walked pt on room air.  Required 2LNC to have oxygenation above 87%

## 2020-09-12 NOTE — Progress Notes (Signed)
Son is expected here at 1500 to pick up pt for discharge

## 2020-09-12 NOTE — Discharge Instructions (Signed)
Wear you oxygen all the time.   10 Things You Can Do to Manage Your COVID-19 Symptoms at Home If you have possible or confirmed COVID-19: 1. Stay home except to get medical care. 2. Monitor your symptoms carefully. If your symptoms get worse, call your healthcare provider immediately. 3. Get rest and stay hydrated. 4. If you have a medical appointment, call the healthcare provider ahead of time and tell them that you have or may have COVID-19. 5. For medical emergencies, call 911 and notify the dispatch personnel that you have or may have COVID-19. 6. Cover your cough and sneezes with a tissue or use the inside of your elbow. 7. Wash your hands often with soap and water for at least 20 seconds or clean your hands with an alcohol-based hand sanitizer that contains at least 60% alcohol. 8. As much as possible, stay in a specific room and away from other people in your home. Also, you should use a separate bathroom, if available. If you need to be around other people in or outside of the home, wear a mask. 9. Avoid sharing personal items with other people in your household, like dishes, towels, and bedding. 10. Clean all surfaces that are touched often, like counters, tabletops, and doorknobs. Use household cleaning sprays or wipes according to the label instructions. [email or web address] 03/06/2020 This information is not intended to replace advice given to you by your health care provider. Make sure you discuss any questions you have with your health care provider. Document Revised: 06/22/2020 Document Reviewed: 06/22/2020 Elsevier Patient Education  2021 [employer].

## 2020-09-13 LAB — CULTURE, BLOOD (ROUTINE X 2): Culture: NO GROWTH

## 2020-09-16 ENCOUNTER — Emergency Department: Payer: Medicaid Other

## 2020-09-16 ENCOUNTER — Emergency Department
Admission: EM | Admit: 2020-09-16 | Discharge: 2020-09-17 | Disposition: A | Discharge status: Home / Self Care | Payer: Medicaid Other | Attending: Emergency Medicine | Admitting: Emergency Medicine

## 2020-09-16 ENCOUNTER — Other Ambulatory Visit: Payer: Self-pay

## 2020-09-16 DIAGNOSIS — Z8616 Personal history of COVID-19: Secondary | ICD-10-CM | POA: Diagnosis not present

## 2020-09-16 DIAGNOSIS — I110 Hypertensive heart disease with heart failure: Secondary | ICD-10-CM | POA: Insufficient documentation

## 2020-09-16 DIAGNOSIS — E1165 Type 2 diabetes mellitus with hyperglycemia: Secondary | ICD-10-CM | POA: Diagnosis not present

## 2020-09-16 DIAGNOSIS — R739 Hyperglycemia, unspecified: Secondary | ICD-10-CM | POA: Diagnosis present

## 2020-09-16 DIAGNOSIS — Z79899 Other long term (current) drug therapy: Secondary | ICD-10-CM | POA: Diagnosis not present

## 2020-09-16 DIAGNOSIS — Z7984 Long term (current) use of oral hypoglycemic drugs: Secondary | ICD-10-CM | POA: Insufficient documentation

## 2020-09-16 DIAGNOSIS — I5043 Acute on chronic combined systolic (congestive) and diastolic (congestive) heart failure: Secondary | ICD-10-CM | POA: Diagnosis not present

## 2020-09-16 DIAGNOSIS — Z87891 Personal history of nicotine dependence: Secondary | ICD-10-CM | POA: Diagnosis not present

## 2020-09-16 DIAGNOSIS — Z7901 Long term (current) use of anticoagulants: Secondary | ICD-10-CM | POA: Diagnosis not present

## 2020-09-16 DIAGNOSIS — U071 COVID-19: Secondary | ICD-10-CM

## 2020-09-16 DIAGNOSIS — J449 Chronic obstructive pulmonary disease, unspecified: Secondary | ICD-10-CM | POA: Diagnosis not present

## 2020-09-16 LAB — COMPREHENSIVE METABOLIC PANEL
ALT: 29 U/L (ref 0–44)
AST: 21 U/L (ref 15–41)
Albumin: 3.8 g/dL (ref 3.5–5.0)
Alkaline Phosphatase: 76 U/L (ref 38–126)
Anion gap: 10 (ref 5–15)
BUN: 41 mg/dL — ABNORMAL HIGH (ref 8–23)
CO2: 26 mmol/L (ref 22–32)
Calcium: 9.2 mg/dL (ref 8.9–10.3)
Chloride: 100 mmol/L (ref 98–111)
Creatinine, Ser: 1.36 mg/dL — ABNORMAL HIGH (ref 0.61–1.24)
GFR, Estimated: 52 mL/min — ABNORMAL LOW (ref >60)
Glucose, Bld: 492 mg/dL — ABNORMAL HIGH (ref 70–99)
Potassium: 5.3 mmol/L — ABNORMAL HIGH (ref 3.5–5.1)
Sodium: 136 mmol/L (ref 135–145)
Total Bilirubin: 1 mg/dL (ref 0.3–1.2)
Total Protein: 7.1 g/dL (ref 6.5–8.1)

## 2020-09-16 LAB — CBC WITH DIFFERENTIAL/PLATELET
Abs Immature Granulocytes: 1.26 K/uL — ABNORMAL HIGH (ref 0.00–0.07)
Basophils Absolute: 0 K/uL (ref 0.0–0.1)
Basophils Relative: 0 %
Eosinophils Absolute: 0 K/uL (ref 0.0–0.5)
Eosinophils Relative: 0 %
HCT: 33.9 % — ABNORMAL LOW (ref 39.0–52.0)
Hemoglobin: 10.8 g/dL — ABNORMAL LOW (ref 13.0–17.0)
Immature Granulocytes: 8 %
Lymphocytes Relative: 2 %
Lymphs Abs: 0.4 K/uL — ABNORMAL LOW (ref 0.7–4.0)
MCH: 25.7 pg — ABNORMAL LOW (ref 26.0–34.0)
MCHC: 31.9 g/dL (ref 30.0–36.0)
MCV: 80.5 fL (ref 80.0–100.0)
Monocytes Absolute: 0.5 K/uL (ref 0.1–1.0)
Monocytes Relative: 3 %
Neutro Abs: 13.7 K/uL — ABNORMAL HIGH (ref 1.7–7.7)
Neutrophils Relative %: 87 %
Platelets: 231 K/uL (ref 150–400)
RBC: 4.21 MIL/uL — ABNORMAL LOW (ref 4.22–5.81)
RDW: 14 % (ref 11.5–15.5)
Smear Review: NORMAL
WBC: 15.8 K/uL — ABNORMAL HIGH (ref 4.0–10.5)
nRBC: 0.1 % (ref 0.0–0.2)

## 2020-09-16 LAB — BLOOD GAS, VENOUS
Acid-Base Excess: 3.6 mmol/L — ABNORMAL HIGH (ref 0.0–2.0)
Bicarbonate: 29.1 mmol/L — ABNORMAL HIGH (ref 20.0–28.0)
O2 Saturation: 42.7 %
Patient temperature: 37
pCO2, Ven: 47 mmHg (ref 44.0–60.0)
pH, Ven: 7.4 (ref 7.250–7.430)
pO2, Ven: <31 mmHg — CL (ref 32.0–45.0)

## 2020-09-16 LAB — BRAIN NATRIURETIC PEPTIDE: B Natriuretic Peptide: 482.1 pg/mL — ABNORMAL HIGH (ref 0.0–100.0)

## 2020-09-16 LAB — TROPONIN I (HIGH SENSITIVITY)
Troponin I (High Sensitivity): 44 ng/L — ABNORMAL HIGH (ref <18)
Troponin I (High Sensitivity): 44 ng/L — ABNORMAL HIGH

## 2020-09-16 LAB — CBG MONITORING, ED
Glucose-Capillary: 436 mg/dL — ABNORMAL HIGH (ref 70–99)
Glucose-Capillary: 385 mg/dL — ABNORMAL HIGH (ref 70–99)

## 2020-09-16 MED ORDER — LACTATED RINGERS IV BOLUS
1000.0000 mL | Freq: Once | INTRAVENOUS | Status: AC
  Administered 2020-09-16: 1000 mL via INTRAVENOUS

## 2020-09-16 MED ORDER — INSULIN ASPART 100 UNIT/ML ~~LOC~~ SOLN
5.0000 [IU] | Freq: Once | SUBCUTANEOUS | Status: AC
  Administered 2020-09-16: 5 [IU] via INTRAVENOUS
  Filled 2020-09-16: qty 1

## 2020-09-16 NOTE — Discharge Instructions (Signed)
Your high blood sugar levels today are likely due to the prednisone that you have been taking.  Please stop taking the prednisone and continue the rest of your prescribed diabetic medication.  Please schedule follow-up with your primary care doctor and return to the ER for any new or worsening symptoms.  Please also avoid carbohydrates and sweets as much as possible to avoid your blood sugar levels rising.

## 2020-09-16 NOTE — ED Notes (Signed)
Vitals for EMS:  BP 170/100 HR 100 98% 2L

## 2020-09-16 NOTE — ED Notes (Signed)
ER provider is at bedside.

## 2020-09-16 NOTE — ED Triage Notes (Signed)
Pt BIB EMS from home. EMS states pt was hypoglycemic and "lethargic" at home and is not to the touch. Pt on home oxygen at 2lpm via [place]. EMS reports history of stroke and pt is non-verbal, but as per family has been acting normal.

## 2020-09-16 NOTE — ED Provider Notes (Signed)
[facility] Emergency Department Provider Note   ____________________________________________   Event Date/Time   First MD Initiated Contact with Patient 09/16/20 2051     (approximate)  I have reviewed the triage vital signs and the nursing notes.   HISTORY  Chief Complaint Hyperglycemia    HPI Brian Baker is a 85 y.o. male with past medical history of hypertension, permanent atrial fibrillation on Eliquis, CHF, and stroke who presents to the ED for hyperglycemia.  History is limited as patient is Arabic speaking only and aphasic following his stroke.  He has been unable to participate with interpreters in the past and typically communicates through his son.  Son states that the patient has been less interactive and slower to respond over the past couple of days.  They checked his blood sugar at home and noted it to be "high."  He was recently admitted to the hospital for COVID-19 after testing positive on January 18.  He has been on 2 L nasal cannula since then and his breathing has been doing well.  He has not had any fevers, chest pain, vomiting, or diarrhea.  He was prescribed prednisone as well as glipizide to take at the time of his discharge 4 days ago.        Past Medical History:  Diagnosis Date  . Arrhythmia    atrial fibrillation  . CHF (congestive heart failure) ([place])   . COPD (chronic obstructive pulmonary disease) ([place])   . Enlarged prostate   . Hyperlipemia   . Hypertension   . Stroke [facility])     Patient Active Problem List   Diagnosis Date Noted  . Protein-calorie malnutrition, severe 09/12/2020  . Atrial fibrillation, chronic ([place])   . Type 2 diabetes mellitus with hyperlipidemia ([place])   . Acquired thrombophilia ([place])   . Acute metabolic encephalopathy   . Acute kidney injury superimposed on CKD ([place])   . Weakness   . Impaired fasting glucose   . Pneumonia due to COVID-19 virus 09/08/2020  . Protein malnutrition ([place]) 09/08/2020   . Elevated troponin 06/28/2019  . Acute on chronic respiratory failure with hypoxia ([place]) 06/28/2019  . Stroke ([place])   . Hyperlipemia   . Enlarged prostate   . GERD (gastroesophageal reflux disease)   . Hypokalemia   . Acute on chronic combined systolic and diastolic CHF (congestive heart failure) ([place])   . Permanent atrial fibrillation ([place])   . Chronic systolic heart failure ([place]) 02/05/2019  . HTN (hypertension) 02/05/2019  . Acute CHF (congestive heart failure) ([place]) 01/24/2019    No past surgical history on file.  Prior to Admission medications   Medication Sig Start Date End Date Taking? Authorizing Provider  albuterol (VENTOLIN HFA) 108 (90 Base) MCG/ACT inhaler Inhale 2 puffs into the lungs every 6 (six) hours as needed for wheezing or shortness of breath. 09/12/20   Loletha Grayer, MD  apixaban (ELIQUIS) 5 MG TABS tablet Take 1 tablet (5 mg total) by mouth 2 (two) times daily. 03/14/19   Loletha Grayer, MD  blood glucose meter kit and supplies KIT Dispense based on patient and insurance preference. Use up to four times daily as directed. (FOR ICD-9 250.00, 250.01). 09/12/20   Loletha Grayer, MD  diltiazem (CARDIZEM CD) 180 MG 24 hr capsule Take 180 mg by mouth daily.    [provider]  furosemide (LASIX) 20 MG tablet Take 1 tablet (20 mg total) by mouth daily as needed for fluid or edema (shortness of breath). 09/12/20  09/12/21  Loletha Grayer, MD  glipiZIDE (GLUCOTROL) 5 MG tablet One tab po daily while taking steroids (prednisone) 09/13/20   Loletha Grayer, MD  metoprolol tartrate (LOPRESSOR) 50 MG tablet Take 1 tablet (50 mg total) by mouth 2 (two) times daily. 06/30/19   Ivor Costa, MD  Nutritional Supplements (FEEDING SUPPLEMENT, NEPRO CARB STEADY,) LIQD Take 237 mLs by mouth 2 (two) times daily between meals. 09/12/20   Loletha Grayer, MD  omeprazole (PRILOSEC) 20 MG capsule Take 20 mg by mouth daily.  12/25/18   [provider]  pravastatin  (PRAVACHOL) 40 MG tablet TK 1 T PO HS 01/04/19   [provider]  predniSONE (DELTASONE) 20 MG tablet Two tabs po daily for six days 09/13/20   Loletha Grayer, MD  spironolactone (ALDACTONE) 25 MG tablet Take 0.5 tablets (12.5 mg total) by mouth daily. 03/14/19   Loletha Grayer, MD  tamsulosin (FLOMAX) 0.4 MG CAPS capsule Take 0.4 mg by mouth daily.  10/29/18   [provider]  traZODone (DESYREL) 50 MG tablet Take 1 tablet (50 mg total) by mouth at bedtime as needed for sleep. 09/12/20   Loletha Grayer, MD    Allergies Patient has no known allergies.  No family history on file.  Social History Social History   Tobacco Use  . Smoking status: Former Research scientist (life sciences)  . Smokeless tobacco: Never Used  Substance Use Topics  . Alcohol use: Not Currently  . Drug use: Never    Review of Systems  Constitutional: No fever/chills.  Positive for generalized weakness. Eyes: No visual changes. ENT: No sore throat. Cardiovascular: Denies chest pain. Respiratory: Denies shortness of breath. Gastrointestinal: No abdominal pain.  No nausea, no vomiting.  No diarrhea.  No constipation. Genitourinary: Negative for dysuria. Musculoskeletal: Negative for back pain. Skin: Negative for rash. Neurological: Negative for headaches, focal weakness or numbness.  ____________________________________________   PHYSICAL EXAM:  VITAL SIGNS: ED Triage Vitals  Enc Vitals Group     BP      Pulse      Resp      Temp      Temp src      SpO2      Weight      Height      Head Circumference      Peak Flow      Pain Score      Pain Loc      Pain Edu?      Excl. in [place]?     Constitutional: Awake and alert, nonverbal. Eyes: Conjunctivae are normal. Head: Atraumatic. Nose: No congestion/rhinnorhea. Mouth/Throat: Mucous membranes are dry. Neck: Normal ROM Cardiovascular: Normal rate, irregularly irregular rhythm. Grossly normal heart sounds. Respiratory: Normal respiratory effort.  No  retractions. Lungs CTAB. Gastrointestinal: Soft and nontender. No distention. Genitourinary: deferred Musculoskeletal: No lower extremity tenderness nor edema. Neurologic:  Normal speech and language. No gross focal neurologic deficits are appreciated. Skin:  Skin is warm, dry and intact. No rash noted. Psychiatric: Mood and affect are normal. Speech and behavior are normal.  ____________________________________________   LABS (all labs ordered are listed, but only abnormal results are displayed)  Labs Reviewed  CBC WITH DIFFERENTIAL/PLATELET - Abnormal; Notable for the following components:      Result Value   WBC 15.8 (*)    RBC 4.21 (*)    Hemoglobin 10.8 (*)    HCT 33.9 (*)    MCH 25.7 (*)    Neutro Abs 13.7 (*)    Lymphs Abs 0.4 (*)  Abs Immature Granulocytes 1.26 (*)    All other components within normal limits  COMPREHENSIVE METABOLIC PANEL - Abnormal; Notable for the following components:   Potassium 5.3 (*)    Glucose, Bld 492 (*)    BUN 41 (*)    Creatinine, Ser 1.36 (*)    GFR, Estimated 52 (*)    All other components within normal limits  BLOOD GAS, VENOUS - Abnormal; Notable for the following components:   pO2, Ven <31.0 (*)    Bicarbonate 29.1 (*)    Acid-Base Excess 3.6 (*)    All other components within normal limits  BRAIN NATRIURETIC PEPTIDE - Abnormal; Notable for the following components:   B Natriuretic Peptide 482.1 (*)    All other components within normal limits  CBG MONITORING, ED - Abnormal; Notable for the following components:   Glucose-Capillary 436 (*)    All other components within normal limits  CBG MONITORING, ED - Abnormal; Notable for the following components:   Glucose-Capillary 385 (*)    All other components within normal limits  TROPONIN I (HIGH SENSITIVITY) - Abnormal; Notable for the following components:   Troponin I (High Sensitivity) 44 (*)    All other components within normal limits  TROPONIN I (HIGH SENSITIVITY) -  Abnormal; Notable for the following components:   Troponin I (High Sensitivity) 44 (*)    All other components within normal limits  URINALYSIS, COMPLETE (UACMP) WITH MICROSCOPIC  CBG MONITORING, ED   ____________________________________________  EKG  ED ECG REPORT I, Blake Divine, the attending physician, personally viewed and interpreted this ECG.   Date: 09/16/2020  EKG Time: 20:57  Rate: 113  Rhythm: atrial fibrillation, rate 113  Axis: Normal  Intervals:nonspecific intraventricular conduction delay  ST&T Change: Nonspecific ST/T changes   PROCEDURES  Procedure(s) performed (including Critical Care):  Procedures   ____________________________________________   INITIAL IMPRESSION / ASSESSMENT AND PLAN / ED COURSE       85 year old male with past medical history of hypertension, permanent atrial fibrillation on Eliquis, CHF, and stroke who presents to the ED for hyperglycemia and decreased responsiveness at home.  Patient was discharged from the hospital 4 days ago on 2 L nasal cannula after being admitted for COVID-19, was prescribed prednisone and glipizide at the time of discharge.  Patient noted to be hyperglycemic here in the ED but no signs of DKA.  He does appear dehydrated and we will treat with IV fluids, hyperglycemia is likely secondary to his recent steroid treatment.  He seems to be recovering well from COVID-19 with no respiratory difficulties on the 2 L nasal cannula, chest x-ray reviewed by me and shows no change from previous.  Troponin is mildly elevated however this is comparable to his prior baseline and unchanged on recheck.  On reevaluation, son states that patient seems to have perked up following IV fluid bolus.  Blood glucose is improving and we will give small dose of IV insulin given it remains elevated on recheck.  Given work-up is reassuring outside of hyperglycemia, patient is appropriate for discharge home with PCP follow-up.  Son was counseled  to have patient discontinue steroids as risks seem to outweigh benefits at this time.  He may continue the glipizide for the next 2 days and then follow-up with PCP for recheck.  Son was counseled to have patient return to the ED for any new or worsening symptoms.      ____________________________________________   FINAL CLINICAL IMPRESSION(S) / ED DIAGNOSES  Final diagnoses:  Hyperglycemia  COVID-19     ED Discharge Orders    None       Note:  This document was prepared using Dragon voice recognition software and may include unintentional dictation errors.   Blake Divine, MD 09/17/20 [phone]

## 2020-09-16 NOTE — ED Notes (Addendum)
POC blood sugar in the ER was 436

## 2020-09-17 LAB — CBG MONITORING, ED: Glucose-Capillary: 278 mg/dL — ABNORMAL HIGH (ref 70–99)

## 2020-09-21 ENCOUNTER — Emergency Department: Payer: Medicaid Other

## 2020-09-21 ENCOUNTER — Inpatient Hospital Stay: Payer: Medicaid Other

## 2020-09-21 ENCOUNTER — Inpatient Hospital Stay
Admission: EM | Admit: 2020-09-21 | Discharge: 2020-10-20 | DRG: 871 | Disposition: E | Payer: Medicaid Other | Attending: Internal Medicine | Admitting: Internal Medicine

## 2020-09-21 ENCOUNTER — Other Ambulatory Visit: Payer: Self-pay

## 2020-09-21 ENCOUNTER — Encounter: Payer: Self-pay | Admitting: Internal Medicine

## 2020-09-21 DIAGNOSIS — Z7952 Long term (current) use of systemic steroids: Secondary | ICD-10-CM | POA: Diagnosis not present

## 2020-09-21 DIAGNOSIS — J9601 Acute respiratory failure with hypoxia: Secondary | ICD-10-CM

## 2020-09-21 DIAGNOSIS — I110 Hypertensive heart disease with heart failure: Secondary | ICD-10-CM | POA: Diagnosis present

## 2020-09-21 DIAGNOSIS — Z66 Do not resuscitate: Secondary | ICD-10-CM | POA: Diagnosis not present

## 2020-09-21 DIAGNOSIS — A4151 Sepsis due to Escherichia coli [E. coli]: Secondary | ICD-10-CM | POA: Diagnosis present

## 2020-09-21 DIAGNOSIS — Z8673 Personal history of transient ischemic attack (TIA), and cerebral infarction without residual deficits: Secondary | ICD-10-CM | POA: Diagnosis not present

## 2020-09-21 DIAGNOSIS — N400 Enlarged prostate without lower urinary tract symptoms: Secondary | ICD-10-CM | POA: Diagnosis present

## 2020-09-21 DIAGNOSIS — U071 COVID-19: Secondary | ICD-10-CM | POA: Diagnosis present

## 2020-09-21 DIAGNOSIS — R739 Hyperglycemia, unspecified: Secondary | ICD-10-CM | POA: Diagnosis present

## 2020-09-21 DIAGNOSIS — A4189 Other specified sepsis: Secondary | ICD-10-CM | POA: Diagnosis present

## 2020-09-21 DIAGNOSIS — J189 Pneumonia, unspecified organism: Secondary | ICD-10-CM

## 2020-09-21 DIAGNOSIS — Z7901 Long term (current) use of anticoagulants: Secondary | ICD-10-CM | POA: Diagnosis not present

## 2020-09-21 DIAGNOSIS — J440 Chronic obstructive pulmonary disease with acute lower respiratory infection: Secondary | ICD-10-CM | POA: Diagnosis present

## 2020-09-21 DIAGNOSIS — Z9119 Patient's noncompliance with other medical treatment and regimen: Secondary | ICD-10-CM

## 2020-09-21 DIAGNOSIS — J159 Unspecified bacterial pneumonia: Secondary | ICD-10-CM | POA: Diagnosis present

## 2020-09-21 DIAGNOSIS — Z7984 Long term (current) use of oral hypoglycemic drugs: Secondary | ICD-10-CM | POA: Diagnosis not present

## 2020-09-21 DIAGNOSIS — G9349 Other encephalopathy: Secondary | ICD-10-CM | POA: Diagnosis present

## 2020-09-21 DIAGNOSIS — Z87891 Personal history of nicotine dependence: Secondary | ICD-10-CM | POA: Diagnosis not present

## 2020-09-21 DIAGNOSIS — D6959 Other secondary thrombocytopenia: Secondary | ICD-10-CM | POA: Diagnosis present

## 2020-09-21 DIAGNOSIS — D6489 Other specified anemias: Secondary | ICD-10-CM | POA: Diagnosis present

## 2020-09-21 DIAGNOSIS — N179 Acute kidney failure, unspecified: Secondary | ICD-10-CM | POA: Diagnosis present

## 2020-09-21 DIAGNOSIS — R6521 Severe sepsis with septic shock: Secondary | ICD-10-CM | POA: Diagnosis present

## 2020-09-21 DIAGNOSIS — Z4659 Encounter for fitting and adjustment of other gastrointestinal appliance and device: Secondary | ICD-10-CM

## 2020-09-21 DIAGNOSIS — I509 Heart failure, unspecified: Secondary | ICD-10-CM | POA: Diagnosis present

## 2020-09-21 DIAGNOSIS — I468 Cardiac arrest due to other underlying condition: Secondary | ICD-10-CM | POA: Diagnosis present

## 2020-09-21 DIAGNOSIS — E872 Acidosis: Secondary | ICD-10-CM | POA: Diagnosis present

## 2020-09-21 DIAGNOSIS — J1282 Pneumonia due to coronavirus disease 2019: Secondary | ICD-10-CM | POA: Diagnosis present

## 2020-09-21 DIAGNOSIS — R0603 Acute respiratory distress: Secondary | ICD-10-CM

## 2020-09-21 DIAGNOSIS — Z79899 Other long term (current) drug therapy: Secondary | ICD-10-CM

## 2020-09-21 DIAGNOSIS — E785 Hyperlipidemia, unspecified: Secondary | ICD-10-CM | POA: Diagnosis present

## 2020-09-21 DIAGNOSIS — D72810 Lymphocytopenia: Secondary | ICD-10-CM | POA: Diagnosis present

## 2020-09-21 DIAGNOSIS — I4891 Unspecified atrial fibrillation: Secondary | ICD-10-CM | POA: Diagnosis present

## 2020-09-21 DIAGNOSIS — I469 Cardiac arrest, cause unspecified: Secondary | ICD-10-CM | POA: Diagnosis not present

## 2020-09-21 DIAGNOSIS — Z978 Presence of other specified devices: Secondary | ICD-10-CM

## 2020-09-21 LAB — CBC WITH DIFFERENTIAL/PLATELET
Abs Immature Granulocytes: 1.08 10*3/uL — ABNORMAL HIGH (ref 0.00–0.07)
Basophils Absolute: 0.1 10*3/uL (ref 0.0–0.1)
Basophils Relative: 0 %
Eosinophils Absolute: 0 10*3/uL (ref 0.0–0.5)
Eosinophils Relative: 0 %
HCT: 38.6 % — ABNORMAL LOW (ref 39.0–52.0)
Hemoglobin: 11.9 g/dL — ABNORMAL LOW (ref 13.0–17.0)
Immature Granulocytes: 2 %
Lymphocytes Relative: 1 %
Lymphs Abs: 0.4 10*3/uL — ABNORMAL LOW (ref 0.7–4.0)
MCH: 25.9 pg — ABNORMAL LOW (ref 26.0–34.0)
MCHC: 30.8 g/dL (ref 30.0–36.0)
MCV: 83.9 fL (ref 80.0–100.0)
Monocytes Absolute: 1.6 10*3/uL — ABNORMAL HIGH (ref 0.1–1.0)
Monocytes Relative: 4 %
Neutro Abs: 41 10*3/uL — ABNORMAL HIGH (ref 1.7–7.7)
Neutrophils Relative %: 93 %
Platelets: 140 10*3/uL — ABNORMAL LOW (ref 150–400)
RBC: 4.6 MIL/uL (ref 4.22–5.81)
RDW: 16.1 % — ABNORMAL HIGH (ref 11.5–15.5)
Smear Review: NORMAL
WBC: 44.2 10*3/uL — ABNORMAL HIGH (ref 4.0–10.5)
nRBC: 0 % (ref 0.0–0.2)

## 2020-09-21 LAB — COMPREHENSIVE METABOLIC PANEL
ALT: 17 U/L (ref 0–44)
AST: 24 U/L (ref 15–41)
Albumin: 3.1 g/dL — ABNORMAL LOW (ref 3.5–5.0)
Alkaline Phosphatase: 60 U/L (ref 38–126)
Anion gap: 16 — ABNORMAL HIGH (ref 5–15)
BUN: 25 mg/dL — ABNORMAL HIGH (ref 8–23)
CO2: 18 mmol/L — ABNORMAL LOW (ref 22–32)
Calcium: 8.4 mg/dL — ABNORMAL LOW (ref 8.9–10.3)
Chloride: 105 mmol/L (ref 98–111)
Creatinine, Ser: 1.52 mg/dL — ABNORMAL HIGH (ref 0.61–1.24)
GFR, Estimated: 45 mL/min — ABNORMAL LOW (ref >60)
Glucose, Bld: 329 mg/dL — ABNORMAL HIGH (ref 70–99)
Potassium: 4.6 mmol/L (ref 3.5–5.1)
Sodium: 139 mmol/L (ref 135–145)
Total Bilirubin: 1.5 mg/dL — ABNORMAL HIGH (ref 0.3–1.2)
Total Protein: 5.9 g/dL — ABNORMAL LOW (ref 6.5–8.1)

## 2020-09-21 LAB — CREATININE, SERUM
Creatinine, Ser: 2.07 mg/dL — ABNORMAL HIGH (ref 0.61–1.24)
GFR, Estimated: 31 mL/min — ABNORMAL LOW (ref >60)

## 2020-09-21 LAB — URINALYSIS, COMPLETE (UACMP) WITH MICROSCOPIC
Bacteria, UA: NONE SEEN
Bilirubin Urine: NEGATIVE
Glucose, UA: >=500 mg/dL — AB
Ketones, ur: 5 mg/dL — AB
Nitrite: NEGATIVE
Protein, ur: 30 mg/dL — AB
Specific Gravity, Urine: 1.015 (ref 1.005–1.030)
pH: 5 (ref 5.0–8.0)

## 2020-09-21 LAB — CBC
HCT: 32.2 % — ABNORMAL LOW (ref 39.0–52.0)
Hemoglobin: 9.9 g/dL — ABNORMAL LOW (ref 13.0–17.0)
MCH: 26.1 pg (ref 26.0–34.0)
MCHC: 30.7 g/dL (ref 30.0–36.0)
MCV: 84.7 fL (ref 80.0–100.0)
Platelets: 91 10*3/uL — ABNORMAL LOW (ref 150–400)
RBC: 3.8 MIL/uL — ABNORMAL LOW (ref 4.22–5.81)
RDW: 16.3 % — ABNORMAL HIGH (ref 11.5–15.5)
WBC: 28.8 10*3/uL — ABNORMAL HIGH (ref 4.0–10.5)
nRBC: 0 % (ref 0.0–0.2)

## 2020-09-21 LAB — BLOOD GAS, ARTERIAL
Acid-base deficit: 6.3 mmol/L — ABNORMAL HIGH (ref 0.0–2.0)
Bicarbonate: 17.6 mmol/L — ABNORMAL LOW (ref 20.0–28.0)
Delivery systems: POSITIVE
Expiratory PAP: 6
FIO2: 1
Inspiratory PAP: 12
O2 Saturation: 86.7 %
Patient temperature: 37
pCO2 arterial: 29 mmHg — ABNORMAL LOW (ref 32.0–48.0)
pH, Arterial: 7.39 (ref 7.350–7.450)
pO2, Arterial: 53 mmHg — ABNORMAL LOW (ref 83.0–108.0)

## 2020-09-21 LAB — LACTIC ACID, PLASMA
Lactic Acid, Venous: 6.8 mmol/L (ref 0.5–1.9)
Lactic Acid, Venous: 7.2 mmol/L (ref 0.5–1.9)
Lactic Acid, Venous: >11 mmol/L (ref 0.5–1.9)
Lactic Acid, Venous: 10.9 mmol/L (ref 0.5–1.9)

## 2020-09-21 LAB — TROPONIN I (HIGH SENSITIVITY)
Troponin I (High Sensitivity): 77 ng/L — ABNORMAL HIGH (ref <18)
Troponin I (High Sensitivity): 105 ng/L (ref <18)
Troponin I (High Sensitivity): 195 ng/L (ref <18)

## 2020-09-21 LAB — PROTIME-INR
INR: 1.6 — ABNORMAL HIGH (ref 0.8–1.2)
Prothrombin Time: 18.8 seconds — ABNORMAL HIGH (ref 11.4–15.2)

## 2020-09-21 LAB — CBG MONITORING, ED
Glucose-Capillary: 276 mg/dL — ABNORMAL HIGH (ref 70–99)
Glucose-Capillary: 244 mg/dL — ABNORMAL HIGH (ref 70–99)
Glucose-Capillary: 353 mg/dL — ABNORMAL HIGH (ref 70–99)

## 2020-09-21 LAB — BRAIN NATRIURETIC PEPTIDE: B Natriuretic Peptide: 558.2 pg/mL — ABNORMAL HIGH (ref 0.0–100.0)

## 2020-09-21 LAB — FIBRIN DERIVATIVES D-DIMER (ARMC ONLY): Fibrin derivatives D-dimer (ARMC): 848.87 ng/mL (FEU) — ABNORMAL HIGH (ref 0.00–499.00)

## 2020-09-21 LAB — PROCALCITONIN: Procalcitonin: 7.92 ng/mL

## 2020-09-21 LAB — APTT: aPTT: 27 seconds (ref 24–36)

## 2020-09-21 MED ORDER — SODIUM BICARBONATE 8.4 % IV SOLN
INTRAVENOUS | Status: DC
  Filled 2020-09-21 (×2): qty 850
  Filled 2020-09-21: qty 150
  Filled 2020-09-21 (×2): qty 850

## 2020-09-21 MED ORDER — SODIUM CHLORIDE 0.9 % IV SOLN: 1.0000 g | Freq: Once | INTRAVENOUS | Status: DC

## 2020-09-21 MED ORDER — FENTANYL 2500MCG IN NS 250ML (10MCG/ML) PREMIX INFUSION: 25.0000 ug/h | INTRAVENOUS | Status: DC

## 2020-09-21 MED ORDER — SODIUM CHLORIDE 0.9 % IV BOLUS
500.0000 mL | Freq: Once | INTRAVENOUS | Status: AC
  Administered 2020-09-21: 500 mL via INTRAVENOUS

## 2020-09-21 MED ORDER — SODIUM BICARBONATE 8.4 % IV SOLN
50.0000 meq | Freq: Once | INTRAVENOUS | Status: AC
  Administered 2020-09-21: 50 meq via INTRAVENOUS
  Filled 2020-09-21: qty 50

## 2020-09-21 MED ORDER — NOREPINEPHRINE 4 MG/250ML-% IV SOLN
0.0000 ug/min | INTRAVENOUS | Status: DC
  Administered 2020-09-21: 2 ug/min via INTRAVENOUS
  Filled 2020-09-21: qty 250

## 2020-09-21 MED ORDER — PROPOFOL 1000 MG/100ML IV EMUL
INTRAVENOUS | Status: AC
  Filled 2020-09-21: qty 100

## 2020-09-21 MED ORDER — FENTANYL CITRATE (PF) 100 MCG/2ML IJ SOLN
25.0000 ug | Freq: Once | INTRAMUSCULAR | Status: DC
Start: 2020-09-21 — End: 2020-09-22

## 2020-09-21 MED ORDER — SODIUM CHLORIDE 0.9 % IV BOLUS (SEPSIS): 1000.0000 mL | Freq: Once | INTRAVENOUS | Status: DC

## 2020-09-21 MED ORDER — METHYLPREDNISOLONE SODIUM SUCC 40 MG IJ SOLR: 40.0000 mg | Freq: Two times a day (BID) | INTRAMUSCULAR | Status: DC

## 2020-09-21 MED ORDER — FAMOTIDINE IN NACL 20-0.9 MG/50ML-% IV SOLN
20.0000 mg | INTRAVENOUS | Status: DC
  Administered 2020-09-21: 20 mg via INTRAVENOUS
  Filled 2020-09-21: qty 50

## 2020-09-21 MED ORDER — NOREPINEPHRINE BITARTRATE 1 MG/ML IV SOLN
INTRAVENOUS | Status: AC | PRN
  Administered 2020-09-21: 100 ug via INTRAVENOUS

## 2020-09-21 MED ORDER — FENTANYL CITRATE (PF) 100 MCG/2ML IJ SOLN
100.0000 ug | Freq: Once | INTRAMUSCULAR | Status: AC
  Administered 2020-09-21: 100 ug via INTRAVENOUS

## 2020-09-21 MED ORDER — LACTATED RINGERS IV BOLUS
250.0000 mL | Freq: Once | INTRAVENOUS | Status: AC
  Administered 2020-09-21: 250 mL via INTRAVENOUS

## 2020-09-21 MED ORDER — FENTANYL BOLUS VIA INFUSION
25.0000 ug | INTRAVENOUS | Status: DC | PRN
  Filled 2020-09-21: qty 25

## 2020-09-21 MED ORDER — SODIUM CHLORIDE 0.9 % IV SOLN
500.0000 mg | Freq: Once | INTRAVENOUS | Status: AC
  Administered 2020-09-21: 500 mg via INTRAVENOUS
  Filled 2020-09-21: qty 500

## 2020-09-21 MED ORDER — EPINEPHRINE 1 MG/10ML IJ SOSY
PREFILLED_SYRINGE | INTRAMUSCULAR | Status: AC | PRN
  Administered 2020-09-21 (×4): 1 mg via INTRAVENOUS

## 2020-09-21 MED ORDER — ONDANSETRON HCL 4 MG/2ML IJ SOLN: 4.0000 mg | Freq: Four times a day (QID) | INTRAMUSCULAR | Status: DC | PRN

## 2020-09-21 MED ORDER — SODIUM BICARBONATE 8.4 % IV SOLN
INTRAVENOUS | Status: AC | PRN
  Administered 2020-09-21: 50 meq via INTRAVENOUS

## 2020-09-21 MED ORDER — DILTIAZEM HCL-DEXTROSE 125-5 MG/125ML-% IV SOLN (PREMIX)
5.0000 mg/h | INTRAVENOUS | Status: DC
  Administered 2020-09-21: 8 mg/h via INTRAVENOUS

## 2020-09-21 MED ORDER — SODIUM CHLORIDE 0.9 % IV SOLN: 2.0000 g | Freq: Two times a day (BID) | INTRAVENOUS | Status: DC

## 2020-09-21 MED ORDER — POLYETHYLENE GLYCOL 3350 17 G PO PACK: 17.0000 g | PACK | Freq: Every day | ORAL | Status: DC | PRN

## 2020-09-21 MED ORDER — MIDAZOLAM BOLUS VIA INFUSION
1.0000 mg | INTRAVENOUS | Status: DC | PRN
  Filled 2020-09-21: qty 2

## 2020-09-21 MED ORDER — HEPARIN SODIUM (PORCINE) 5000 UNIT/ML IJ SOLN
5000.0000 [IU] | Freq: Three times a day (TID) | INTRAMUSCULAR | Status: DC
  Administered 2020-09-21: 5000 [IU] via SUBCUTANEOUS
  Filled 2020-09-21: qty 1

## 2020-09-21 MED ORDER — SODIUM CHLORIDE 0.9% FLUSH
3.0000 mL | Freq: Two times a day (BID) | INTRAVENOUS | Status: DC
  Administered 2020-09-21: 3 mL via INTRAVENOUS

## 2020-09-21 MED ORDER — DOCUSATE SODIUM 100 MG PO CAPS: 100.0000 mg | ORAL_CAPSULE | Freq: Two times a day (BID) | ORAL | Status: DC | PRN

## 2020-09-21 MED ORDER — VASOPRESSIN 20 UNITS/100 ML INFUSION FOR SHOCK
0.0000 [IU]/min | INTRAVENOUS | Status: DC
  Administered 2020-09-21: 0.03 [IU]/min via INTRAVENOUS
  Filled 2020-09-21: qty 100

## 2020-09-21 MED ORDER — INSULIN ASPART 100 UNIT/ML ~~LOC~~ SOLN
0.0000 [IU] | SUBCUTANEOUS | Status: DC
  Administered 2020-09-21: 20 [IU] via SUBCUTANEOUS
  Administered 2020-09-22: 15 [IU] via SUBCUTANEOUS
  Filled 2020-09-21 (×2): qty 1

## 2020-09-21 MED ORDER — HALOPERIDOL LACTATE 5 MG/ML IJ SOLN: 2.0000 mg | Freq: Once | INTRAMUSCULAR | Status: DC

## 2020-09-21 MED ORDER — MIDAZOLAM 50MG/50ML (1MG/ML) PREMIX INFUSION: 0.0000 mg/h | INTRAVENOUS | Status: DC

## 2020-09-21 MED ORDER — VANCOMYCIN HCL IN DEXTROSE 1-5 GM/200ML-% IV SOLN: 1000.0000 mg | Freq: Once | INTRAVENOUS | Status: DC

## 2020-09-21 MED ORDER — NOREPINEPHRINE 16 MG/250ML-% IV SOLN
0.0000 ug/min | INTRAVENOUS | Status: DC
  Administered 2020-09-21: 2 ug/min via INTRAVENOUS
  Filled 2020-09-21: qty 250

## 2020-09-21 MED ORDER — POLYETHYLENE GLYCOL 3350 17 G PO PACK: 17.0000 g | PACK | Freq: Every day | ORAL | Status: DC

## 2020-09-21 MED ORDER — ROCURONIUM BROMIDE 50 MG/5ML IV SOLN
50.0000 mg | Freq: Once | INTRAVENOUS | Status: AC
  Administered 2020-09-21: 50 mg via INTRAVENOUS
  Filled 2020-09-21: qty 5

## 2020-09-21 MED ORDER — LACTATED RINGERS IV SOLN: INTRAVENOUS | Status: DC

## 2020-09-21 MED ORDER — DILTIAZEM HCL 25 MG/5ML IV SOLN
INTRAVENOUS | Status: AC
  Administered 2020-09-21: 5 mg via INTRAVENOUS
  Filled 2020-09-21: qty 5

## 2020-09-21 MED ORDER — SODIUM CHLORIDE 0.9 % IV SOLN: 2.0000 g | INTRAVENOUS | Status: DC

## 2020-09-21 MED ORDER — SODIUM CHLORIDE 0.9 % IV BOLUS (SEPSIS): 250.0000 mL | Freq: Once | INTRAVENOUS | Status: DC

## 2020-09-21 MED ORDER — DEXMEDETOMIDINE HCL IN NACL 400 MCG/100ML IV SOLN: 0.4000 ug/kg/h | INTRAVENOUS | Status: DC

## 2020-09-21 MED ORDER — SODIUM CHLORIDE 0.9% FLUSH: 3.0000 mL | INTRAVENOUS | Status: DC | PRN

## 2020-09-21 MED ORDER — SODIUM BICARBONATE 8.4 % IV SOLN
50.0000 meq | Freq: Once | INTRAVENOUS | Status: AC
  Administered 2020-09-21: 50 meq via INTRAVENOUS

## 2020-09-21 MED ORDER — VANCOMYCIN HCL 1500 MG/300ML IV SOLN
1500.0000 mg | Freq: Once | INTRAVENOUS | Status: AC
  Administered 2020-09-21: 1500 mg via INTRAVENOUS
  Filled 2020-09-21: qty 300

## 2020-09-21 MED ORDER — VANCOMYCIN VARIABLE DOSE PER UNSTABLE RENAL FUNCTION (PHARMACIST DOSING): Status: DC

## 2020-09-21 MED ORDER — SODIUM CHLORIDE 0.9 % IV SOLN
2.0000 g | Freq: Once | INTRAVENOUS | Status: AC
  Administered 2020-09-21: 2 g via INTRAVENOUS
  Filled 2020-09-21: qty 2

## 2020-09-21 MED ORDER — DOCUSATE SODIUM 50 MG/5ML PO LIQD
100.0000 mg | Freq: Two times a day (BID) | ORAL | Status: DC
  Filled 2020-09-21 (×2): qty 10

## 2020-09-21 MED ORDER — ETOMIDATE 2 MG/ML IV SOLN
20.0000 mg | Freq: Once | INTRAVENOUS | Status: AC
  Administered 2020-09-21: 20 mg via INTRAVENOUS

## 2020-09-21 MED ORDER — SODIUM CHLORIDE 0.9 % IV SOLN: 250.0000 mL | INTRAVENOUS | Status: DC | PRN

## 2020-09-21 MED ORDER — DILTIAZEM HCL 25 MG/5ML IV SOLN: 5.0000 mg | Freq: Once | INTRAVENOUS | Status: AC

## 2020-09-21 MED ORDER — HYDROCORTISONE NA SUCCINATE PF 100 MG IJ SOLR
100.0000 mg | Freq: Once | INTRAMUSCULAR | Status: AC
  Administered 2020-09-21: 100 mg via INTRAVENOUS
  Filled 2020-09-21: qty 2

## 2020-09-21 MED ORDER — HYDROCORTISONE NA SUCCINATE PF 100 MG IJ SOLR
50.0000 mg | Freq: Four times a day (QID) | INTRAMUSCULAR | Status: DC
  Administered 2020-09-21: 50 mg via INTRAVENOUS
  Filled 2020-09-21: qty 2

## 2020-09-21 MED ORDER — ACETAMINOPHEN 325 MG PO TABS: 650.0000 mg | ORAL_TABLET | ORAL | Status: DC | PRN

## 2020-09-21 NOTE — Code Documentation (Signed)
Pulse check CPR resumed

## 2020-09-21 NOTE — ED Notes (Signed)
Lab at bedside at this time.  

## 2020-09-21 NOTE — Sepsis Progress Note (Signed)
Notified bedside nurse of need to draw repeat lactic acid.  Thank you, .

## 2020-09-21 NOTE — ED Notes (Signed)
Respiratory at bedside.

## 2020-09-21 NOTE — ED Provider Notes (Signed)
CODE [facility] Department of Emergency Medicine   Code Blue CONSULT NOTE  Chief Complaint: Cardiac arrest/unresponsive   Level V Caveat: Unresponsive  History of present illness: I was notified by overhead page for stat assistance in that patient's room. On my arrival I was informed patient was in cardiac arrest. Patient intubated shortly prior to this for hypoxic resp failure 2/2 COVID associated with severe sepsis.    ROS: Unable to obtain, Level V caveat  Scheduled Meds: . docusate  100 mg Per Tube BID  . fentaNYL (SUBLIMAZE) injection  25 mcg Intravenous Once  . heparin  5,000 Units Subcutaneous Q8H  . hydrocortisone sod succinate (SOLU-CORTEF) inj  50 mg Intravenous Q6H  . insulin aspart  0-20 Units Subcutaneous Q4H  . polyethylene glycol  17 g Per Tube Daily  . sodium chloride flush  3 mL Intravenous Q12H  . vancomycin variable dose per unstable renal function (pharmacist dosing)   Does not apply See admin instructions   Continuous Infusions: . sodium chloride    . diltiazem (CARDIZEM) infusion Stopped (09/18/2020 2055)  . famotidine (PEPCID) IV Stopped (08/31/2020 2208)  . fentaNYL infusion INTRAVENOUS Stopped (09/11/2020 2145)  . meropenem (MERREM) IV    . midazolam Stopped (09/07/2020 2146)  . norepinephrine (LEVOPHED) Adult infusion 30 mcg/min (10/21/20 0129)  . phenylephrine (NEO-SYNEPHRINE) Adult infusion 40 mcg/min (October 21, 2020 0130)  . sodium bicarbonate (isotonic) 150 mEq in D5W 1000 mL infusion 150 mL/hr at 21-Oct-2020 0139  . sodium chloride    . vasopressin 0.03 Units/min (09/07/2020 2129)   PRN Meds:.sodium chloride, acetaminophen, docusate sodium, EPINEPHrine, fentaNYL, midazolam, ondansetron (ZOFRAN) IV, polyethylene glycol, sodium chloride flush Past Medical History:  Diagnosis Date  . Arrhythmia    atrial fibrillation  . CHF (congestive heart failure) ([facility])   . COPD (chronic obstructive pulmonary disease) ([facility])   . Enlarged prostate    . Hyperlipemia   . Hypertension   . Stroke [facility])    No past surgical history on file. Social History   Socioeconomic History  . Marital status: Widowed    Spouse name: Not on file  . Number of children: Not on file  . Years of education: Not on file  . Highest education level: Not on file  Occupational History  . Not on file  Tobacco Use  . Smoking status: Former Games developer  . Smokeless tobacco: Never Used  Substance and Sexual Activity  . Alcohol use: Not Currently  . Drug use: Never  . Sexual activity: Not on file  Other Topics Concern  . Not on file  Social History Narrative  . Not on file   Social Determinants of Health   Financial Resource Strain: Not on file  Food Insecurity: Not on file  Transportation Needs: Not on file  Physical Activity: Not on file  Stress: Not on file  Social Connections: Not on file  Intimate Partner Violence: Not on file   No Known Allergies  Last set of Vital Signs (not current) Vitals:   10/21/20 0130 21-Oct-2020 0145  BP: (!) 164/73 102/67  Pulse: (!) 112 (!) 109  Resp: (!) 24 (!) 24  Temp: (!) 96.7 F (35.9 C) (!) 96.6 F (35.9 C)  SpO2: (!) 86% (!) 75%      Physical Exam  Gen: unresponsive Cardiovascular: pulseless  Resp: apneic. Breath sounds equal bilaterally with bagging  Abd: nondistended  Neuro: GCS 3, unresponsive to pain  HEENT: No blood in posterior pharynx, gag reflex absent  Neck:  No crepitus  Musculoskeletal: No deformity  Skin: warm  Procedures (when applicable, including Critical Care time): CPR  Date/Time: 10/09/2020 1:52 AM Performed by: Gilles Chiquito, MD Authorized by: Gilles Chiquito, MD  CPR Procedure Details:    CPR/ACLS performed in the ED: Yes     Outcome: ROSC obtained    CPR performed via ACLS guidelines under my direct supervision.  See RN documentation for details including defibrillator use, medications, doses and timing. .Critical Care Performed by: Gilles Chiquito, MD Authorized  by: Gilles Chiquito, MD   Critical care provider statement:    Critical care time (minutes):  20   Critical care was time spent personally by me on the following activities:  Discussions with consultants, evaluation of patient's response to treatment, examination of patient, ordering and performing treatments and interventions, ordering and review of laboratory studies, ordering and review of radiographic studies, pulse oximetry, re-evaluation of patient's condition, obtaining history from patient or surrogate and review of old charts     MDM / Assessment and Plan CPR initiated prior to my arrival. PEA as initial rhythm. ACLS performed per [facility] with Asystole and PEA only rhythms identified. Several epi doses given and levophed increased from to 50. ROSC achieved after of CPR. Family notified by myself of cardiac arrest with ROSC and high likelyhood of death in next 12 hours.Also discussed with ICU provider.     Gilles Chiquito, MD 09/23/2020 [phone]

## 2020-09-21 NOTE — Code Documentation (Signed)
CPR started.

## 2020-09-21 NOTE — Consult Note (Signed)
CODE SEPSIS - PHARMACY COMMUNICATION  **Broad Spectrum Antibiotics should be administered within 1 hour of Sepsis diagnosis**  Time Code Sepsis Called/Page Received: 1236  Antibiotics Ordered: Cefepime/Vancomycin/Azithromycin  Time of 1st antibiotic administration: 1305  Additional action taken by pharmacy: none  If necessary, Name of Provider/Nurse Contacted: n/a    Albina Billet ,PharmD Clinical Pharmacist  09/07/2020  12:44 PM

## 2020-09-21 NOTE — Consult Note (Signed)
PHARMACY -  BRIEF ANTIBIOTIC NOTE   Pharmacy has received consult(s) for Vancomcyin/Cefepime from an ED provider.  The patient's profile has been reviewed for ht/wt/allergies/indication/available labs.    One time order(s) placed for Vancomycin 1500mg  IV x 1 and Cefepime 2g IV x 1  Further antibiotics/pharmacy consults should be ordered by admitting physician if indicated.                       Thank you,  , PharmD, BCPS Clinical Pharmacist 09/10/2020 12:47 PM

## 2020-09-21 NOTE — Code Documentation (Signed)
Dopamine stopped

## 2020-09-21 NOTE — ED Notes (Addendum)
Called lab to draw pt blue top and blood cultures due to pt being hard stick. Per [place] in lab, will send someone as soon as possible

## 2020-09-21 NOTE — Consult Note (Signed)
Pharmacy Antibiotic Note  Brian Baker is a 85 y.o. male admitted on 2020-10-13 with pneumonia.  Pharmacy has been consulted for cefepime and vancomycin dosing.  Plan: Cefepime 2g q12H   Pt received vancomycin 1500 mg x 1 in the ED. Pt's Scr is trending up. Will dose vancomycin by levels. Will order MRSA PCR and if it returns negative will recommend to d/c vancomycin.   Height: 5\' 7"  (170.2 cm) Weight: 72.6 kg (160 lb 0.9 oz) IBW/kg (Calculated) : 66.1  Temp (24hrs), Avg:100.3 F (37.9 C), Min:100.3 F (37.9 C), Max:100.3 F (37.9 C)  Recent Labs  Lab 09/16/20 2057 Oct 13, 2020 1155 13-Oct-2020 1339  WBC 15.8* 44.2*  --   CREATININE 1.36* 1.52*  --   LATICACIDVEN  --  6.8* 7.2*    Estimated Creatinine Clearance: 35 mL/min (A) (by C-G formula based on SCr of 1.52 mg/dL (H)).    No Known Allergies  Antimicrobials this admission: 1/31 cefepime >>  1/31 vancomycin >>   Dose adjustments this admission: None  Microbiology results: 1/31 BCx: pending 1/31 MRSA PCR: ordered.   Thank you for allowing pharmacy to be a part of this patient's care.  2/31 10/13/2020 8:38 PM

## 2020-09-21 NOTE — ED Notes (Addendum)
Started diltiazem gtt at 5mg /hr per MD verbal order at this time

## 2020-09-21 NOTE — Code Documentation (Signed)
Pulse check. Pulse found.  

## 2020-09-21 NOTE — Procedures (Signed)
Intubation Procedure Note  Brian Baker  [id number]  [birth year]  Date:09/19/2020  Time:8:33 PM   Provider Performing:Jeremiah D Elvina Sidle    Procedure: Intubation (31500)  Indication(s) Respiratory Failure  Consent Unable to obtain consent due to emergent nature of procedure.   Anesthesia Etomidate, Fentanyl and Rocuronium   Time Out Verified patient identification, verified procedure, site/side was marked, verified correct patient position, special equipment/implants available, medications/allergies/relevant history reviewed, required imaging and test results available.   Sterile Technique Usual hand hygeine, masks, and gloves were used   Procedure Description Patient positioned in bed supine.  Sedation given as noted above.  Patient was intubated with endotracheal tube using Glidescope.  View was Grade 1 full glottis .  Number of attempts was 1.  Colorimetric CO2 detector was consistent with tracheal placement.   Complications/Tolerance None; patient tolerated the procedure well. Chest X-ray is ordered to verify placement.   EBL Minimal   Specimen(s) None   Size 7.5 ETT  Secured at 22 cm at the right lip   Harlon Ditty, [facility] Pager: [phone]

## 2020-09-21 NOTE — ED Notes (Addendum)
Per Elvina Sidle, NP nurse may titrate by until reaching 40 mcg of Levophed.

## 2020-09-21 NOTE — Progress Notes (Signed)
elink monitoring code sepsis.  

## 2020-09-21 NOTE — ED Notes (Signed)
Respiratory called regarding pt O2. Belia Heman, MD sent message regarding patient status.

## 2020-09-21 NOTE — ED Provider Notes (Addendum)
[facility] Emergency Department Provider Note   ____________________________________________   Event Date/Time   First MD Initiated Contact with Patient 09/11/2020 1142     (approximate)  I have reviewed the triage vital signs and the nursing notes.   HISTORY  Chief Complaint Respiratory Distress Patient is somewhat disoriented in respiratory distress he has had a stroke and has limited [place].  This all in her fears with my ability to get a good history.  His son provided much of the history as did the old records.  HPI Brian Baker is a 85 y.o. male patient diagnosed with Covid about 2 weeks ago spent 3 days or so in the hospital and went home.  Then he came back last week with high blood sugar.  This was treated and he went home again he was doing well at home in fact last night he was doing the best he did been since Covid got he got up and walk around but he developed a bad cough and today he was coughing and short of breath felt very cold and was somewhat disoriented so came into the hospital via EMS.  On arrival here he was in A. fib at a rate of about 205 O2 sats were very unreliable but appeared to be in the 80s on 4 L.  Patient was given nonrebreather with 10 L per nasal cannula at the same time O2 sats still unreliable because the rapid rate went up into the high 80s to low 90s intermittently.  Saline was started wide open when we got an IV and then he got 5 the diltiazem with heart rate coming down into the 160s.  Another 10 of diltiazem his heart rate then went down into the 140s blood pressure was running about 108.  Patient put on BiPAP heart rate is now about 160 on a diltiazem drip at 8 blood pressures in the high 90s patient's head not quite 1 L saline so far.  Chest x-ray now shows left-sided infiltrate with a white blood count of 44,000.  White count 5 days ago was only 15,000.  Patient has been off of steroids for over 5 days.  He was only on steroids  for 4 days.         Past Medical History:  Diagnosis Date  . Arrhythmia    atrial fibrillation  . CHF (congestive heart failure) ([place])   . COPD (chronic obstructive pulmonary disease) ([place])   . Enlarged prostate   . Hyperlipemia   . Hypertension   . Stroke [facility])     Patient Active Problem List   Diagnosis Date Noted  . Protein-calorie malnutrition, severe 09/12/2020  . Atrial fibrillation, chronic ([place])   . Type 2 diabetes mellitus with hyperlipidemia ([place])   . Acquired thrombophilia ([place])   . Acute metabolic encephalopathy   . Acute kidney injury superimposed on CKD ([place])   . Weakness   . Impaired fasting glucose   . Pneumonia due to COVID-19 virus 09/08/2020  . Protein malnutrition ([place]) 09/08/2020  . Elevated troponin 06/28/2019  . Acute on chronic respiratory failure with hypoxia ([place]) 06/28/2019  . Stroke ([place])   . Hyperlipemia   . Enlarged prostate   . GERD (gastroesophageal reflux disease)   . Hypokalemia   . Acute on chronic combined systolic and diastolic CHF (congestive heart failure) ([place])   . Permanent atrial fibrillation ([place])   . Chronic systolic heart failure ([place]) 02/05/2019  . HTN (hypertension) 02/05/2019  . Acute  CHF (congestive heart failure) ([place]) 01/24/2019    No past surgical history on file.  Prior to Admission medications   Medication Sig Start Date End Date Taking? Authorizing Provider  albuterol (VENTOLIN HFA) 108 (90 Base) MCG/ACT inhaler Inhale 2 puffs into the lungs every 6 (six) hours as needed for wheezing or shortness of breath. 09/12/20   Loletha Grayer, MD  apixaban (ELIQUIS) 5 MG TABS tablet Take 1 tablet (5 mg total) by mouth 2 (two) times daily. 03/14/19   Loletha Grayer, MD  blood glucose meter kit and supplies KIT Dispense based on patient and insurance preference. Use up to four times daily as directed. (FOR ICD-9 250.00, 250.01). 09/12/20   Loletha Grayer, MD  diltiazem (CARDIZEM CD) 180 MG 24 hr capsule Take 180 mg by  mouth daily.    [provider]  furosemide (LASIX) 20 MG tablet Take 1 tablet (20 mg total) by mouth daily as needed for fluid or edema (shortness of breath). 09/12/20 09/12/21  Loletha Grayer, MD  glipiZIDE (GLUCOTROL) 5 MG tablet One tab po daily while taking steroids (prednisone) 09/13/20   Loletha Grayer, MD  metoprolol tartrate (LOPRESSOR) 50 MG tablet Take 1 tablet (50 mg total) by mouth 2 (two) times daily. 06/30/19   Ivor Costa, MD  Nutritional Supplements (FEEDING SUPPLEMENT, NEPRO CARB STEADY,) LIQD Take 237 mLs by mouth 2 (two) times daily between meals. 09/12/20   Loletha Grayer, MD  omeprazole (PRILOSEC) 20 MG capsule Take 20 mg by mouth daily.  12/25/18   [provider]  pravastatin (PRAVACHOL) 40 MG tablet TK 1 T PO HS 01/04/19   [provider]  predniSONE (DELTASONE) 20 MG tablet Two tabs po daily for six days 09/13/20   Loletha Grayer, MD  spironolactone (ALDACTONE) 25 MG tablet Take 0.5 tablets (12.5 mg total) by mouth daily. 03/14/19   Loletha Grayer, MD  tamsulosin (FLOMAX) 0.4 MG CAPS capsule Take 0.4 mg by mouth daily.  10/29/18   [provider]  traZODone (DESYREL) 50 MG tablet Take 1 tablet (50 mg total) by mouth at bedtime as needed for sleep. 09/12/20   Loletha Grayer, MD    Allergies Patient has no known allergies.  No family history on file.  Social History Social History   Tobacco Use  . Smoking status: Former Research scientist (life sciences)  . Smokeless tobacco: Never Used  Substance Use Topics  . Alcohol use: Not Currently  . Drug use: Never    Review of Systems Unable to obtain  ____________________________________________   PHYSICAL EXAM:  VITAL SIGNS: ED Triage Vitals  Enc Vitals Group     BP 09/09/2020 1204 103/75     Pulse Rate 09/03/2020 1204 (!) 141     Resp 09/11/2020 1204 (!) 31     Temp --      Temp src --      SpO2 09/06/2020 1204 (!) 83 %     Weight 09/01/2020 1205 160 lb 0.9 oz (72.6 kg)     Height 08/30/2020 1205 5' 7"   (1.702 m)     Head Circumference --      Peak Flow --      Pain Score --      Pain Loc --      Pain Edu? --      Excl. in [place]? --     Constitutional: Awake in respiratory distress Eyes: Patient is keeping his eyes closed.  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 Nose: No congestion/rhinnorhea. Mouth/Throat: Mucous membranes are moist.  Oropharynx non-erythematous. Neck: No stridor  Cardiovascular very rapid rate, seemingly regular rhythm. Grossly normal heart sounds.  Good peripheral circulation. Respiratory: Increased and rapid respiratory effort.  No retractions. Lungs slight crackles on the right marked crackles on the left Gastrointestinal: Soft and nontender. No distention. No abdominal bruits. Musculoskeletal: No lower extremity tenderness nor edema.   Neurologic: Difficult to obtain as patient distress.  He is moving both arms and legs at this time.  Skin:  Skin is warm, dry and intact.  Hands and feet are cold however no rash noted.   ____________________________________________   LABS (all labs ordered are listed, but only abnormal results are displayed)  Labs Reviewed  COMPREHENSIVE METABOLIC PANEL - Abnormal; Notable for the following components:      Result Value   CO2 18 (*)    Glucose, Bld 329 (*)    BUN 25 (*)    Creatinine, Ser 1.52 (*)    Calcium 8.4 (*)    Total Protein 5.9 (*)    Albumin 3.1 (*)    Total Bilirubin 1.5 (*)    GFR, Estimated 45 (*)    Anion gap 16 (*)    All other components within normal limits  BRAIN NATRIURETIC PEPTIDE - Abnormal; Notable for the following components:   B Natriuretic Peptide 558.2 (*)    All other components within normal limits  LACTIC ACID, PLASMA - Abnormal; Notable for the following components:   Lactic Acid, Venous 6.8 (*)    All other components within normal limits  LACTIC ACID, PLASMA - Abnormal; Notable for the following components:   Lactic Acid,  Venous 7.2 (*)    All other components within normal limits  CBC WITH DIFFERENTIAL/PLATELET - Abnormal; Notable for the following components:   WBC 44.2 (*)    Hemoglobin 11.9 (*)    HCT 38.6 (*)    MCH 25.9 (*)    RDW 16.1 (*)    Platelets 140 (*)    Neutro Abs 41.0 (*)    Lymphs Abs 0.4 (*)    Monocytes Absolute 1.6 (*)    Abs Immature Granulocytes 1.08 (*)    All other components within normal limits  BLOOD GAS, ARTERIAL - Abnormal; Notable for the following components:   pCO2 arterial 29 (*)    pO2, Arterial 53 (*)    Bicarbonate 17.6 (*)    Acid-base deficit 6.3 (*)    All other components within normal limits  FIBRIN DERIVATIVES D-DIMER ([facility] ONLY) - Abnormal; Notable for the following components:   Fibrin derivatives D-dimer ([facility]) 848.87 (*)    All other components within normal limits  PROTIME-INR - Abnormal; Notable for the following components:   Prothrombin Time 18.8 (*)    INR 1.6 (*)    All other components within normal limits  CBG MONITORING, ED - Abnormal; Notable for the following components:   Glucose-Capillary 276 (*)    All other components within normal limits  TROPONIN I (HIGH SENSITIVITY) - Abnormal; Notable for the following components:   Troponin I (High Sensitivity) 77 (*)    All other components within normal limits  TROPONIN I (HIGH SENSITIVITY) - Abnormal; Notable for the following components:   Troponin I (High Sensitivity) 105 (*)    All other components within normal limits  CULTURE, BLOOD (ROUTINE X 2)  CULTURE, BLOOD (ROUTINE X 2)  URINE CULTURE  MRSA PCR SCREENING  APTT  PROCALCITONIN  URINALYSIS, COMPLETE (UACMP) WITH MICROSCOPIC   ____________________________________________  EKG  EKG read interpreted by me shows A. fib at 196 with rate related changes ____________________________________________  RADIOLOGY Gertha Calkin, personally viewed and evaluated these images (plain radiographs) as part of my medical decision  making, as well as reviewing the written report by the radiologist.  ED MD interpretation: Chest x-ray reviewed by me and then read by radiology.  Chest x-ray shows left-sided diffuse infiltrate  Official radiology report(s): DG Chest Portable 1 View  Result Date: 08/27/2020 CLINICAL DATA:  Shortness of breath.  COVID positive. EXAM: PORTABLE CHEST 1 VIEW COMPARISON:  09/16/2020 FINDINGS: 1145 hours. Leftward patient rotation. Interval development of diffuse airspace disease in the left lung. Right lung appears hyperexpanded but clear. The cardio pericardial silhouette is enlarged. Bones are diffusely demineralized. Telemetry leads overlie the chest. IMPRESSION: Rotated film demonstrating interval development of diffuse airspace consolidation in the left lung compatible with pneumonia. Electronically Signed   By: Misty Stanley M.D.   On: 09/05/2020 12:02    ____________________________________________   PROCEDURES  Procedure(s) performed (including Critical Care): Critical care time 1 hour and 15 minutes.  This includes speaking to the son managing the patient's ventilator and increasing his IPAP and EPAP.  Also giving him his fluids ordering antibiotics etc.  I spent almost all that time at the bedside.  I spent a little bit of the time reviewing his old records and speaking to intensive care.  Procedures   ____________________________________________   INITIAL IMPRESSION / ASSESSMENT AND PLAN / ED COURSE  I believe the patient likely has acquired a bacterial pneumonia.  This would fit the definition of hospital associated pneumonia I am awaiting labs at this time.  Patient's O2 sats seem to be acceptable on BiPAP.  Patient's lactic acid is 6.8.  Blood gas acquired immediately on placing the BiPAP showed a pH of 7.39 PCO2 of 29 and PO2 of 53.  86.7% saturation.  Patient's rectal temperature was 100.    ----------------------------------------- 1:20 PM on  09/06/2020 -----------------------------------------  Patient's last blood pressure was 169/113.  Heart rate 180 and increasing the diltiazem.  His O2 sats been about 93 now.   ----------------------------------------- 3:24 PM on 09/15/2020 -----------------------------------------  Patient is O2 sats about 92 now heart rate is 140 blood pressure is 563 systolic patient is moving doing well responding to his son.       ____________________________________________   FINAL CLINICAL IMPRESSION(S) / ED DIAGNOSES  Final diagnoses:  Healthcare-associated pneumonia  Respiratory distress   Also sepsis but I cannot find undifferentiated sepsis is a diagnosis in the computer list  ED Discharge Orders    None      *Please note:  Brian Baker was evaluated in Emergency Department on 08/27/2020 for the symptoms described in the history of present illness. He was evaluated in the context of the global COVID-19 pandemic, which necessitated consideration that the patient might be at risk for infection with the SARS-CoV-2 virus that causes COVID-19. Institutional protocols and algorithms that pertain to the evaluation of patients at risk for COVID-19 are in a state of rapid change based on information released by regulatory bodies including the CDC and federal and state organizations. These policies and algorithms were followed during the patient's care in the ED.  Some ED evaluations and interventions may be delayed as a result of limited staffing during and the pandemic.*   Note:  This document was prepared using Systems analyst  and may include unintentional dictation errors.    Nena Polio, MD 08/24/2020 1525    Nena Polio, MD 09/04/2020 1529 Spoke with patient's family he is to be full code.   Nena Polio, MD 08/25/2020 [phone]

## 2020-09-21 NOTE — ED Notes (Addendum)
Gave 10 mg IVP diltiazem per Darnelle Catalan MD verbal order

## 2020-09-21 NOTE — ED Notes (Addendum)
Gave pt 5 mg IVP diltiazem per Darnelle Catalan MD verbal order

## 2020-09-21 NOTE — ED Notes (Signed)
Son states he lives 2 minutes away and is going home for the night. Will call with updates or changes.

## 2020-09-21 NOTE — H&P (Signed)
NAME:  Brian Baker, MRN:  [id number], DOB:  [birth year], LOS: 0 ADMISSION DATE:  09/20/2020, CONSULTATION DATE:  09/03/2020 REFERRING MD:  Dr. Cinda Quest, CHIEF COMPLAINT:  [place] Respiratory Distress   Brief History:  85 y.o. Male admitted with Acute Hypoxic Respiratory Failure and Septic Shock in the setting COVID-19 Pneumonia and Superimposed HCAP.  Required emergent intubation in the ED.  Shortly following intubation suffered PEA Cardiac arrest due to severe Hypoxia, Hypotension and Severe Metabolic Acidosis.  History of Present Illness:  Brian Baker is a 85 year old male with a past medical history significant for stroke, COPD, CHF, hypertension, hyperlipidemia, BPH who presented to [facility] ED on 09/13/2020 due to respiratory distress.  Patient currently with severe respiratory distress and altered mental status, therefore history obtained from ED and nursing notes.  Per notes he was diagnosed with COVID-19 about 2 weeks ago which he was hospitalized for approximately 3 days and then discharged home.  He was noted to come back to the ED last week with hyperglycemia, but was treated and sent home from the ED.  Since that time he has been improving.  However earlier today he developed severe cough and shortness of breath along with chills.  His family also noted that he was somewhat disoriented.  ED course: Upon arrival to the ED he was noted to be in atrial fibrillation with RVR (heart rate 205) and hypoxic with O2 sats in the high 80s to low 90s.  He was placed on nonrebreather and high flow nasal cannula with only mild improvement in his O2 saturations.  He was then placed on BiPAP and Cardizem drip.  Work-up showed WBC 44.2 with neutrophilia, lymphocytopenia, lactic acid 6.8, procalcitonin 7.92, BNP 558, high-sensitivity troponin 77, bicarb 18, glucose 329, BUN 25, creatinine 1.52, anion gap 16, and FDPs 848.87.  ABG was obtained shortly after placing patient on BiPAP with pH 7.39/PCO2 29/PO2 53/bicarb  17.6.  Chest x-ray demonstrated diffuse consolidation of the left lung consistent with pneumonia.  He was given IV fluids and broad-spectrum antibiotics.  PCCM was contacted to admit the patient to ICU for further work-up and treatment of acute hypoxic respiratory failure in the setting of COVID-19 pneumonia with superimposed healthcare associated pneumonia.  While in the ED he became severely encephalopathic of which he was noncompliant with BiPAP, becoming severely hypoxic with O2 saturations in the 60s and 70s.  Was emergently intubated.  Shortly after intubation he became severely hypotensive, then became bradycardic with progression to PEA cardiac arrest.  CPR and ACLS were begun immediately.  ROSC was obtained after approximately 24 minutes of CPR and ACLS.  Cardiac arrest felt to be in the setting of severe hypoxia, severe hypotension, and severe metabolic acidosis.  He was placed on bicarb drip, multiple vasopressors (Levophed, vasopressin, Neo-synephrine), and stress dose steroids.  His poor prognosis was discussed with his son who requested that his father remained full code and that full aggressive care be maintained.  Despite aggressive care he had 3 subsequent episodes of PEA cardiac arrest (each arrest approx. 5-6 minutes in duration before ROSC obtained), and began showing signs of severe anoxic brain injury on neurological exam.  His family decided to make him a DNR/DNI with no escalation of care beyond current measures, and shortly thereafter he expired.  Past Medical History:  Stroke Hypertension Hyperlipidemia BPH COPD CHF  Significant Hospital Events:  1/31: PCCM asked to admit 1/31: Required emergent intubation in ED.  Shortly following intubation, pt hypotensive with bradycardia, subsequent PEA Cardiac  arrest (approx. 24 min duration before ROSC) 2/1: 2nd PEA Cardiac arrest (approx. 7 min duration before ROSC)  Consults:  PCCM Palliative Care  Procedures:  1/31:  Emergent Endotracheal intubation 1/31: Left Femoral CVC placed  Significant Diagnostic Tests:  1/31: CXR>>Progressive volume loss in the left hemithorax with diffuse left lung opacification. Small amount of aerated lung is seen in the periphery of the mid lung and left lung base. Multiple overlying monitoring devices obscure detailed assessment. Hyperinflated right lung. No visualized pneumothorax. Limited assessment for pleural effusion. Heart size and mediastinal contours are obscured.  Micro Data:  1/18: SARS-CoV-2 Ag>> Positive 1/31: Blood culture x2>> E. COLI (CTX-M ESBL) in 1/4 bottles 1/31: Strep pneumo urinary antigen>> 1/31: Legionella urinary antigen>> 1/31: Urine>> 1/31: Tracheal aspirate>>  Antimicrobials:  Cefepime 1/31>>2/1 Meropenem 2/1>> Vancomycin 1/31>>  Interim History / Subjective:  Called by nursing as Pt with severe hypoxia and respiratory distress, and encephalopathy; was emergently intubated Following intubation pt with severe hypotension, metabolic acidosis, and bradycardia, progressed to PEA Cardiac arrest (approx. 8 min. Before ROSC obtained)   Objective   Blood pressure (!) 192/161, pulse (!) 124, temperature 100.3 F (37.9 C), temperature source Rectal, resp. rate (!) 30, height 5' 7"  (1.702 m), weight 72.6 kg, SpO2 (!) 75 %.        Intake/Output Summary (Last 24 hours) at 09/19/2020 2035 Last data filed at 09/13/2020 1025 Gross per 24 hour  Intake 3163.8 ml  Output --  Net 3163.8 ml   Filed Weights   08/30/2020 1205  Weight: 72.6 kg    Examination: General: Critically ill-appearing male, laying in bed, intubated and sedated, no acute distress HENT: Atraumatic, normocephalic, neck supple, no JVD Lungs: Unable to auscultate due to see CAPR, even, synchronous with ventilator Cardiovascular: Tachycardia, irregularly irregular rhythm, 1+ distal pulses Abdomen: Soft, nontender, nondistended, no guarding or rebound tenderness, bowel sounds  hypoactive Extremities: No deformities, no edema, no clubbing Neuro: Currently sedated (received fentanyl, etomidate, rocuronium for intubation) pupils PERRLA Skin: Warm and dry.  No obvious rashes, lesions, ulcerations  Resolved Hospital Problem list   N/A  Assessment & Plan:   Acute hypoxic respiratory failure in the setting of COVID-19 pneumonia and superimposed bacterial pneumonia -Mechanical ventilation via ARDS protocol, target PRVC 6 cc/kg -Wean PEEP and FiO2 as able to maintain O2 sats >88% -Follow intermittent CXR & ABG as needed -Goal plateau pressure less than 30, driving pressure less than 15 -Paralytics if necessary for vent synchrony, gas exchange -Cycle prone positioning if necessary for oxygenation -Deep sedation per PAD protocol, goal RASS -4, currently fentanyl, midazolam -Diuresis as blood pressure and renal function can tolerate, goal CVP 5-8.   -VAP prevention order set -Steroids -Follow inflammatory markers: Ferritin, D-dimer, CRP -Vitamin C, zinc -Plan to repeat and check resp cultures   PEA cardiac arrest Septic shock +/- cardiogenic shock Atrial Fibrillation w/ RVR -Continuous cardiac monitoring -Maintain MAP greater than 65 -Received IV fluid resuscitation in the ED -Vasopressors as needed to maintain MAP greater than 65 -Stress dose steroids -Trend lactic acid -Trend troponin -Obtain 2D echocardiogram -Consider Cardiology consult -Cardizem gtt currently on hold due to bradycardia and PEA arrest   Severe sepsis secondary to COVID-19 pneumonia and superimposed bacterial pneumonia E. Coli (ESBL) Bacteremia -Monitor fever curve -Trend WBCs and procalcitonin -Follow cultures as above -Antibiotics adjusted to meropenem and vancomycin for now -Consult ID for bacteremia, appreciate input   AKI Anion gap metabolic acidosis in the setting of lactic acidosis -Monitor I&O's / urinary  output -Follow BMP -Ensure adequate renal perfusion -Avoid  nephrotoxic agents as able -Replace electrolytes as indicated  -IV fluids -Bicarb drip -Consider nephrology consult   Anemia without signs or symptoms of bleeding Mild thrombocytopenia, likely in the setting of sepsis -Monitor for S/Sx of bleeding -Trend CBC -SQ Heparin for VTE Prophylaxis  -Transfuse for Hgb <7 -Peripheral blood smear with normal platelets, normal RBC morphology and normal WBC morphology   Hyperglycemia -CBGs -Sliding scale insulin -Follow ICU hypohyperglycemia protocol   Best practice (evaluated daily)  Diet: NPO Pain/Anxiety/Delirium protocol (if indicated): Fentanyl gtt VAP protocol (if indicated): Yes DVT prophylaxis: Heparin SQ GI prophylaxis: Pepcid Glucose control: SSI Mobility: Bedrest Disposition:ICU  Goals of Care:  Last date of multidisciplinary goals of care discussion:N/A Family and staff present: Updated RN at bedside, along with pt's son Dontez Hauss  Summary of discussion: Critically ill s/p cardiac arrest.  High risk for subsequent cardiac arrest and death (suspect he will not survive the next 12-24 hrs) Follow up goals of care discussion due: October 15, 2020 Code Status: Full code  Labs   CBC: Recent Labs  Lab 09/16/20 2057 09/03/2020 1155  WBC 15.8* 44.2*  NEUTROABS 13.7* 41.0*  HGB 10.8* 11.9*  HCT 33.9* 38.6*  MCV 80.5 83.9  PLT 231 140*    Basic Metabolic Panel: Recent Labs  Lab 09/16/20 2057 08/29/2020 1155  NA 136 139  K 5.3* 4.6  CL 100 105  CO2 26 18*  GLUCOSE 492* 329*  BUN 41* 25*  CREATININE 1.36* 1.52*  CALCIUM 9.2 8.4*   GFR: Estimated Creatinine Clearance: 35 mL/min (A) (by C-G formula based on SCr of 1.52 mg/dL (H)). Recent Labs  Lab 09/16/20 2057 09/01/2020 1155 09/10/2020 1339  PROCALCITON  --   --  7.92  WBC 15.8* 44.2*  --   LATICACIDVEN  --  6.8* 7.2*    Liver Function Tests: Recent Labs  Lab 09/16/20 2057 09/08/2020 1155  AST 21 24  ALT 29 17  ALKPHOS 76 60  BILITOT 1.0 1.5*  PROT 7.1 5.9*   ALBUMIN 3.8 3.1*   No results for input(s): LIPASE, AMYLASE in the last 168 hours. No results for input(s): AMMONIA in the last 168 hours.  ABG    Component Value Date/Time   PHART 7.39 09/18/2020 1144   PCO2ART 29 (L) 09/19/2020 1144   PO2ART 53 (L) 08/23/2020 1144   HCO3 17.6 (L) 09/06/2020 1144   ACIDBASEDEF 6.3 (H) 08/30/2020 1144   O2SAT 86.7 08/22/2020 1144     Coagulation Profile: Recent Labs  Lab 09/12/2020 1339  INR 1.6*    Cardiac Enzymes: No results for input(s): CKTOTAL, CKMB, CKMBINDEX, TROPONINI in the last 168 hours.  HbA1C: Hgb A1c MFr Bld  Date/Time Value Ref Range Status  09/10/2020 04:08 AM 7.2 (H) 4.8 - 5.6 % Final    Comment:    (NOTE) Pre diabetes:          5.7%-6.4%  Diabetes:              >6.4%  Glycemic control for   <7.0% adults with diabetes     CBG: Recent Labs  Lab 09/16/20 2058 09/16/20 2301 09/17/20 0028 08/25/2020 1304  GLUCAP 436* 385* 278* 276*    Review of Systems:   Unable to assess due to AMS and critical illness  Past Medical History:  He,  has a past medical history of Arrhythmia, CHF (congestive heart failure) ([place]), COPD (chronic obstructive pulmonary disease) ([place]), Enlarged prostate, Hyperlipemia, Hypertension, and Stroke (  [place]).   Surgical History:  No past surgical history on file.   Social History:   reports that he has quit smoking. He has never used smokeless tobacco. He reports previous alcohol use. He reports that he does not use drugs.   Family History:  His family history is not on file.   Allergies No Known Allergies   Home Medications  Prior to Admission medications   Medication Sig Start Date End Date Taking? Authorizing Provider  albuterol (VENTOLIN HFA) 108 (90 Base) MCG/ACT inhaler Inhale 2 puffs into the lungs every 6 (six) hours as needed for wheezing or shortness of breath. 09/12/20   Loletha Grayer, MD  apixaban (ELIQUIS) 5 MG TABS tablet Take 1 tablet (5 mg total) by mouth 2 (two)  times daily. 03/14/19   Loletha Grayer, MD  blood glucose meter kit and supplies KIT Dispense based on patient and insurance preference. Use up to four times daily as directed. (FOR ICD-9 250.00, 250.01). 09/12/20   Loletha Grayer, MD  diltiazem (CARDIZEM CD) 180 MG 24 hr capsule Take 180 mg by mouth daily.    [provider]  furosemide (LASIX) 20 MG tablet Take 1 tablet (20 mg total) by mouth daily as needed for fluid or edema (shortness of breath). 09/12/20 09/12/21  Loletha Grayer, MD  glipiZIDE (GLUCOTROL) 5 MG tablet One tab po daily while taking steroids (prednisone) 09/13/20   Loletha Grayer, MD  metoprolol tartrate (LOPRESSOR) 50 MG tablet Take 1 tablet (50 mg total) by mouth 2 (two) times daily. 06/30/19   Ivor Costa, MD  Nutritional Supplements (FEEDING SUPPLEMENT, NEPRO CARB STEADY,) LIQD Take 237 mLs by mouth 2 (two) times daily between meals. 09/12/20   Loletha Grayer, MD  omeprazole (PRILOSEC) 20 MG capsule Take 20 mg by mouth daily.  12/25/18   [provider]  pravastatin (PRAVACHOL) 40 MG tablet TK 1 T PO HS 01/04/19   [provider]  predniSONE (DELTASONE) 20 MG tablet Two tabs po daily for six days 09/13/20   Loletha Grayer, MD  spironolactone (ALDACTONE) 25 MG tablet Take 0.5 tablets (12.5 mg total) by mouth daily. 03/14/19   Loletha Grayer, MD  tamsulosin (FLOMAX) 0.4 MG CAPS capsule Take 0.4 mg by mouth daily.  10/29/18   [provider]  traZODone (DESYREL) 50 MG tablet Take 1 tablet (50 mg total) by mouth at bedtime as needed for sleep. 09/12/20   Loletha Grayer, MD     Critical care time: 60 minutes     Darel Hong, [facility] [place] [facility] Pager: [phone]

## 2020-09-21 NOTE — ED Notes (Addendum)
Rate adjusted on dilt gtt to 8 mg/hr per verbal order from [facility] MD

## 2020-09-21 NOTE — Procedures (Signed)
Central Venous Catheter Insertion Procedure Note  Luverne Zerkle  [id number]  Sep 30, 1935  Date:08/24/2020  Time:10:23 PM   Provider Performing:Analayah Brooke D Elvina Sidle   Procedure: Insertion of Non-tunneled Central Venous Catheter(36556) with US guidance (40981)   Indication(s) Medication administration and Difficult access  Consent Unable to obtain consent due to emergent nature of procedure.  Anesthesia Topical only with 1% lidocaine   Timeout Verified patient identification, verified procedure, site/side was marked, verified correct patient position, special equipment/implants available, medications/allergies/relevant history reviewed, required imaging and test results available.  Sterile Technique Maximal sterile technique including full sterile barrier drape, hand hygiene, sterile gown, sterile gloves, mask, hair covering, sterile ultrasound probe cover (if used).  Procedure Description Area of catheter insertion was cleaned with chlorhexidine and draped in sterile fashion.  With real-time ultrasound guidance a central venous catheter was placed into the left femoral vein. Nonpulsatile blood flow and easy flushing noted in all ports.  The catheter was sutured in place and sterile dressing applied.  Complications/Tolerance None; patient tolerated the procedure well. Chest X-ray is ordered to verify placement for internal jugular or subclavian cannulation.   Chest x-ray is not ordered for femoral cannulation.  EBL Minimal  Specimen(s) None  Line secured at the 20 cm mark.  BIOPATCH applied to the insertion site.     Harlon Ditty, AGACNP-BC [place] [facility] Pager: [phone]

## 2020-09-21 NOTE — ED Notes (Signed)
Smith, MD at bedside.  

## 2020-09-21 NOTE — Code Documentation (Signed)
Levo moved up to 65mcg/min

## 2020-09-21 NOTE — ED Notes (Addendum)
2L warmed NS started at this time per verbal order from MD [facility]

## 2020-09-21 NOTE — ED Triage Notes (Signed)
Pt arrived via EMS from home. EMS states covid+ with increased AMS, and increased respiratory distress.   Per EMS, first BP was 60/40 then retake was 100/60. Per EMS, pt was in Afib on arrival to scene and upon ED arrival was in Afib RVR.   Per EMS, pt 82% on 4L but pt fingers ice cold to touch at this time.   Pt has hx of CHF, stroke, and HTN.

## 2020-09-22 LAB — BLOOD CULTURE ID PANEL (REFLEXED) - BCID2

## 2020-09-22 LAB — LACTIC ACID, PLASMA
Lactic Acid, Venous: >11 mmol/L (ref 0.5–1.9)
Lactic Acid, Venous: >11 mmol/L (ref 0.5–1.9)

## 2020-09-22 LAB — STREP PNEUMONIAE URINARY ANTIGEN: Strep Pneumo Urinary Antigen: NEGATIVE

## 2020-09-22 LAB — C-REACTIVE PROTEIN: CRP: 9.8 mg/dL — ABNORMAL HIGH (ref <1.0)

## 2020-09-22 LAB — PROCALCITONIN: Procalcitonin: 40.61 ng/mL

## 2020-09-22 LAB — FIBRIN DERIVATIVES D-DIMER (ARMC ONLY): Fibrin derivatives D-dimer (ARMC): 4607.95 ng/mL (FEU) — ABNORMAL HIGH (ref 0.00–499.00)

## 2020-09-22 LAB — CBG MONITORING, ED: Glucose-Capillary: 326 mg/dL — ABNORMAL HIGH (ref 70–99)

## 2020-09-22 LAB — TROPONIN I (HIGH SENSITIVITY): Troponin I (High Sensitivity): 219 ng/L (ref <18)

## 2020-09-22 MED ORDER — EPINEPHRINE 1 MG/10ML IJ SOSY
0.1000 mg | PREFILLED_SYRINGE | INTRAMUSCULAR | Status: DC | PRN
  Administered 2020-09-22 (×6): 0.1 mg via INTRAVENOUS

## 2020-09-22 MED ORDER — SODIUM BICARBONATE 8.4 % IV SOLN
INTRAVENOUS | Status: AC
  Filled 2020-09-22: qty 50

## 2020-09-22 MED ORDER — EPINEPHRINE HCL 5 MG/250ML IV SOLN IN NS
0.5000 ug/min | INTRAVENOUS | Status: DC
  Administered 2020-09-22: 04:00:00 0.5 ug/min via INTRAVENOUS
  Filled 2020-09-22: qty 250

## 2020-09-22 MED ORDER — SODIUM BICARBONATE 4 % IV SOLN: 5.0000 mL | Freq: Once | INTRAVENOUS | Status: DC

## 2020-09-22 MED ORDER — SODIUM BICARBONATE 8.4 % IV SOLN: 50.0000 meq | Freq: Once | INTRAVENOUS | Status: AC

## 2020-09-22 MED ORDER — SODIUM CHLORIDE 0.9 % IV SOLN
0.0000 ug/min | INTRAVENOUS | Status: DC
  Administered 2020-09-22: 20 ug/min via INTRAVENOUS
  Filled 2020-09-22: qty 10

## 2020-09-22 MED ORDER — SODIUM BICARBONATE 8.4 % IV SOLN
INTRAVENOUS | Status: AC
  Administered 2020-09-22: 50 meq via INTRAVENOUS
  Filled 2020-09-22: qty 50

## 2020-09-22 MED ORDER — EPINEPHRINE 1 MG/10ML IJ SOSY
PREFILLED_SYRINGE | INTRAMUSCULAR | Status: AC
  Administered 2020-09-22: 0.1 mg via INTRAVENOUS
  Filled 2020-09-22: qty 30

## 2020-09-22 MED ORDER — SODIUM CHLORIDE 0.9 % IV SOLN
1.0000 g | Freq: Two times a day (BID) | INTRAVENOUS | Status: DC
  Administered 2020-09-22: 1 g via INTRAVENOUS
  Filled 2020-09-22: qty 1

## 2020-09-22 DEATH — deceased

## 2020-09-23 LAB — LEGIONELLA PNEUMOPHILA SEROGP 1 UR AG: L. pneumophila Serogp 1 Ur Ag: NEGATIVE

## 2020-09-23 LAB — URINE CULTURE: Culture: NO GROWTH

## 2020-09-24 LAB — CULTURE, BLOOD (ROUTINE X 2): Special Requests: ADEQUATE

## 2020-10-07 LAB — BLOOD GAS, ARTERIAL
Acid-base deficit: 13.5 mmol/L — ABNORMAL HIGH (ref 0.0–2.0)
Bicarbonate: 15.8 mmol/L — ABNORMAL LOW (ref 20.0–28.0)
FIO2: 1
O2 Saturation: 69.2 %
Patient temperature: 37
pCO2 arterial: 52 mmHg — ABNORMAL HIGH (ref 32.0–48.0)
pH, Arterial: 7.09 — CL (ref 7.350–7.450)
pO2, Arterial: 51 mmHg — ABNORMAL LOW (ref 83.0–108.0)

## 2020-10-20 NOTE — Significant Event (Signed)
Pt has suffered 2 additional brief PEA arrests (one at 03:00 with ROSC at 03:03, other at 03:53 with ROSC at 03:57) despite being on Epi and Bicarb gtts, along with Levophed, Vasopressin, and Neo-synephrine.  Pt now with unequal pupils (Right 4 mm and un-reactive, and left 1 mm un-reactive).  Pt is unresponsive on NO SEDATION, no gag/cough or corneal reflexes.      Given his poor neuro exam and multiple cardiac arrests, his prognosis is extremely poor.  Feel that further Resuscitative efforts is futile and inducing further suffering.  Per previous discussions with pt's son, he will not change CODE status until his brother arrives later this morning.  Will attempt to update him again on additional cardiac arrests.     Harlon Ditty, AGACNP-BC [place] [facility] Pager: [phone]

## 2020-10-20 NOTE — ED Notes (Signed)
Pulse check. No pulse felt, PEA on monitor. CPR resumed.

## 2020-10-20 NOTE — ED Notes (Signed)
Patient gag reflex tested, no longer present. NP Elvina Sidle at bedside to do neuro assessment.

## 2020-10-20 NOTE — ED Notes (Signed)
Pulse check. PEA at this time, no pulse felt. CPR resumed.

## 2020-10-20 NOTE — ED Notes (Signed)
Pt HR decreasing. Pulse lost. PEA. CPR initiated.

## 2020-10-20 NOTE — ED Notes (Signed)
Patient now afib rhthym HR 120s. BP WNL, pulse returned at this time after 3 rounds CPR and meds seen in eMAR. NP Elvina Sidle at bedside, discussed wishes with son who states he wants full code until brother can get here in later morning.

## 2020-10-20 NOTE — ED Notes (Signed)
Patient HR decreasing to 50s despite epi drip started. PEA at this time, no pulse felt. CPR initiated.

## 2020-10-20 NOTE — ED Notes (Signed)
Pulse check. Pulse felt at this time. HR 120s.

## 2020-10-20 NOTE — ED Notes (Signed)
[facility] at bedside with [facility].

## 2020-10-20 NOTE — ED Notes (Signed)
Patient heart rate decreasing. PEA at this time, no pulse felt. CPR initiated.

## 2020-10-20 NOTE — ED Notes (Signed)
Per MD Elvina Sidle, will continue to hold cardizem at this time, order remains in case needed at later time. Will d/c LR infusion and begin phenylephrine infusion.

## 2020-10-20 NOTE — ED Notes (Signed)
Pulse check. PEA at this time, no pulse felt. CPR resumed. 

## 2020-10-20 NOTE — Significant Event (Addendum)
CODE BLUE DOCUMENTATION Called to ED by nursing at pt with Bradycardia (HR decreased to 30's) with subsequent PEA arrest. CPR and ACLS begun immediately.  Pt received several rounds of Epi and 1 amp of Bicarb.  ROSC obtained after 7 minutes.  Neosynephrine gtt increased, Levophed and Vasopressin already at max doses.  Increased bicarb gtt to 150 ml/hr.    Called and updated pt's son Richard Ritchey of 2nd Cardiac arrest event.  Discussed his father's critical status with high likelihood of further cardiac arrest and death.  RECOMMEND DNR/DNI STATUS.  Park Breed understands his father's critical illness and poor prognosis, however he is unable to make decision until his brother arrives from out of town later this morning. Remains full code at this time.  Palliative Care consulted.     Harlon Ditty, AGACNP-BC [place] [facility] Pager: [phone]

## 2020-10-20 NOTE — ED Notes (Signed)
Pulse returned. NP Elvina Sidle at bedside at this time.

## 2020-10-20 NOTE — Progress Notes (Signed)
PHARMACY - PHYSICIAN COMMUNICATION CRITICAL VALUE ALERT - BLOOD CULTURE IDENTIFICATION (BCID)  Brian Baker is an 85 y.o. male who presented to [facility] on 08/24/2020 with a chief complaint of HCAP  Assessment:  Ecoli growing in 1 of 4 bottles, CTX-M was detected.  (include suspected source if known)  Name of physician (or Provider) Contacted: Leanord Asal, NP   Current antibiotics: Vanc , Cefepime   Changes to prescribed antibiotics recommended:  Will d/c cefepime and start Meropenem 1 gm IV Q12H . NP wants to continue vanc b/c pt has HCAP.   Results for orders placed or performed during the hospital encounter of 08/23/2020  Blood Culture ID Panel (Reflexed) (Collected: 08/22/2020  1:40 PM)  Result Value Ref Range   Enterococcus faecalis NOT DETECTED NOT DETECTED   Enterococcus Faecium NOT DETECTED NOT DETECTED   Listeria monocytogenes NOT DETECTED NOT DETECTED   Staphylococcus species NOT DETECTED NOT DETECTED   Staphylococcus aureus (BCID) NOT DETECTED NOT DETECTED   Staphylococcus epidermidis NOT DETECTED NOT DETECTED   Staphylococcus lugdunensis NOT DETECTED NOT DETECTED   Streptococcus species NOT DETECTED NOT DETECTED   Streptococcus agalactiae NOT DETECTED NOT DETECTED   Streptococcus pneumoniae NOT DETECTED NOT DETECTED   Streptococcus pyogenes NOT DETECTED NOT DETECTED   A.calcoaceticus-baumannii NOT DETECTED NOT DETECTED   Bacteroides fragilis NOT DETECTED NOT DETECTED   Enterobacterales DETECTED (A) NOT DETECTED   Enterobacter cloacae complex NOT DETECTED NOT DETECTED   Escherichia coli DETECTED (A) NOT DETECTED   Klebsiella aerogenes NOT DETECTED NOT DETECTED   Klebsiella oxytoca NOT DETECTED NOT DETECTED   Klebsiella pneumoniae NOT DETECTED NOT DETECTED   Proteus species NOT DETECTED NOT DETECTED   Salmonella species NOT DETECTED NOT DETECTED   Serratia marcescens NOT DETECTED NOT DETECTED   Haemophilus influenzae NOT DETECTED NOT DETECTED   Neisseria meningitidis NOT  DETECTED NOT DETECTED   Pseudomonas aeruginosa NOT DETECTED NOT DETECTED   Stenotrophomonas maltophilia NOT DETECTED NOT DETECTED   Candida albicans NOT DETECTED NOT DETECTED   Candida auris NOT DETECTED NOT DETECTED   Candida glabrata NOT DETECTED NOT DETECTED   Candida krusei NOT DETECTED NOT DETECTED   Candida parapsilosis NOT DETECTED NOT DETECTED   Candida tropicalis NOT DETECTED NOT DETECTED   Cryptococcus neoformans/gattii NOT DETECTED NOT DETECTED   CTX-M ESBL DETECTED (A) NOT DETECTED   Carbapenem resistance IMP NOT DETECTED NOT DETECTED   Carbapenem resistance KPC NOT DETECTED NOT DETECTED   Carbapenem resistance NDM NOT DETECTED NOT DETECTED   Carbapenem resist OXA 48 LIKE NOT DETECTED NOT DETECTED   Carbapenem resistance VIM NOT DETECTED NOT DETECTED    Marianny Goris D October 08, 2020  1:38 AM

## 2020-10-20 NOTE — Progress Notes (Signed)
Pharmacy Antibiotic Note  Brian Baker is a 85 y.o. male admitted on 2020-10-12 with bacteremia.  Pharmacy has been consulted for Meropenem dosing.  Plan: Meropenem 1 gm IV Q12H ordered to start on 2/1 @ 0145.   Height: 5\' 7"  (170.2 cm) Weight: 72.6 kg (160 lb 0.9 oz) IBW/kg (Calculated) : 66.1  Temp (24hrs), Avg:97.5 F (36.4 C), Min:96.9 F (36.1 C), Max:100.3 F (37.9 C)  Recent Labs  Lab 09/16/20 2057 2020-10-12 1155 October 12, 2020 1339 10/12/2020 1745 2020-10-12 1813 Oct 12, 2020 2224  WBC 15.8* 44.2*  --   --  28.8*  --   CREATININE 1.36* 1.52*  --   --  2.07*  --   LATICACIDVEN  --  6.8* 7.2* >11.0*  --  10.9*    Estimated Creatinine Clearance: 25.7 mL/min (A) (by C-G formula based on SCr of 2.07 mg/dL (H)).    No Known Allergies  Antimicrobials this admission:   >>    >>   Dose adjustments this admission:   Microbiology results:  BCx:   UCx:    Sputum:    MRSA PCR:   Thank you for allowing pharmacy to be a part of this patients care.  Orlean Holtrop D 09/23/2020 1:38 AM

## 2020-10-20 NOTE — ED Notes (Signed)
Pulse lost, asystole at this time. MD Scotty Court at bedside at this time. No lung sounds heard or heart activity seen with ultrasound. Time of death now.

## 2020-10-20 NOTE — Death Summary Note (Signed)
DEATH SUMMARY   Patient Details  Name: Brian Baker MRN: [id number] DOB: 10/25/1935  Admission/Discharge Information   Admit Date:  03/17/2021  Date of Death: Date of Death: 10/17/2020  Time of Death: Time of Death: 0454  Length of Stay: 1  Referring Physician: Hospital, [facility]   Reason(s) for Hospitalization  Acute hypoxic respiratory failure Healthcare associated pneumonia COVID-19 pneumonia E. coli (ESBL) bacteremia Septic shock Cardiac arrest Atrial Fibrillation w/ RVR Acute kidney injury Anion gap metabolic acidosis Anemia Mild thrombocytopenia Hyperglycemia  Diagnoses  Preliminary cause of death:   Cardiac arrest Secondary Diagnoses (including complications and co-morbidities):  Active Problems:   COVID-19 Acute hypoxic respiratory failure Healthcare associated pneumonia E. coli (ESBL) bacteremia Septic shock Atrial Fibrillation w/ RVR Acute kidney injury Anion gap metabolic acidosis Anemia Mild thrombocytopenia Hyperglycemia   Brief Hospital Course (including significant findings, care, treatment, and services provided and events leading to death)   Brian Oxfordhmad Brethauer is a 85 year old male with a past medical history significant for stroke, COPD, CHF, hypertension, hyperlipidemia, BPH who presented to [facility] ED on 03/17/2021 due to respiratory distress.  Patient currently with severe respiratory distress and altered mental status, therefore history obtained from ED and nursing notes.  Per notes he was diagnosed with COVID-19 about 2 weeks ago which he was hospitalized for approximately 3 days and then discharged home.  He was noted to come back to the ED last week with hyperglycemia, but was treated and sent home from the ED.  Since that time he has been improving.  However earlier today he developed severe cough and shortness of breath along with chills.  His family also noted that he was somewhat disoriented.  ED course: Upon arrival to the ED he was noted to be  in atrial fibrillation with RVR (heart rate 205) and hypoxic with O2 sats in the high 80s to low 90s.  He was placed on nonrebreather and high flow nasal cannula with only mild improvement in his O2 saturations.  He was then placed on BiPAP and Cardizem drip.  Work-up showed WBC 44.2 with neutrophilia, lymphocytopenia, lactic acid 6.8, procalcitonin 7.92, BNP 558, high-sensitivity troponin 77, bicarb 18, glucose 329, BUN 25, creatinine 1.52, anion gap 16, and FDPs 848.87.  ABG was obtained shortly after placing patient on BiPAP with pH 7.39/PCO2 29/PO2 53/bicarb 17.6.  Chest x-ray demonstrated diffuse consolidation of the left lung consistent with pneumonia.  He was given IV fluids and broad-spectrum antibiotics.  PCCM was contacted to admit the patient to ICU for further work-up and treatment of acute hypoxic respiratory failure in the setting of COVID-19 pneumonia with superimposed healthcare associated pneumonia.  While in the ED he became severely encephalopathic of which he was noncompliant with BiPAP, becoming severely hypoxic with O2 saturations in the 60s and 70s.  Was emergently intubated.  Shortly after intubation he became severely hypotensive, then became bradycardic with progression to PEA cardiac arrest.  CPR and ACLS were begun immediately.  ROSC was obtained after approximately 24 minutes of CPR and ACLS.  Cardiac arrest felt to be in the setting of severe hypoxia, severe hypotension, and severe metabolic acidosis.  He was placed on bicarb drip, multiple vasopressors (Levophed, vasopressin, Neo-synephrine), and stress dose steroids.  His poor prognosis was discussed with his son who requested that his father remained full code and that full aggressive care be maintained.  Despite aggressive care he had 3 subsequent episodes of PEA cardiac arrest (each arrest approx. 5-6 minutes in duration before ROSC  obtained), and began showing signs of severe anoxic brain injury on neurological  exam.  His family decided to make him a DNR/DNI with no escalation of care beyond current measures, and shortly thereafter he expired.   Pertinent Labs and Studies  Significant Diagnostic Studies DG Abd 1 View  Result Date: 09/06/2020 CLINICAL DATA:  Enteric tube placement EXAM: ABDOMEN - 1 VIEW COMPARISON:  None. FINDINGS: Enteric tube projects over the gastric body. There is nonspecific gaseous dilatation of multiple loops of bowel in the upper abdomen. IMPRESSION: Enteric tube projects over the gastric body. Dilated loops of bowel in the upper abdomen are noted. Electronically Signed   By: Katherine Mantle M.D.   On: 09/20/2020 22:01   Portable Chest x-ray  Result Date: 08/24/2020 CLINICAL DATA:  85 year old male status post intubation. EXAM: PORTABLE CHEST 1 VIEW COMPARISON:  Earlier radiograph dated 09/08/2020. FINDINGS: Interval placement of an endotracheal tube with tip approximately 4 cm above the carina. Enteric tube extends below the diaphragm with tip likely in the proximal stomach. Diffuse left lung opacity with overall volume loss and mild shift of the mediastinum into the left hemithorax similar or minimally improved to prior radiograph. No pneumothorax. Stable cardiomediastinal silhouette. Atherosclerotic calcification of the aorta. No acute osseous pathology. IMPRESSION: 1. Interval placement of an endotracheal tube with tip above the carina. 2. No significant interval change in the diffuse left lung opacity. Electronically Signed   By: Elgie Collard M.D.   On: 09/17/2020 21:50   DG Chest Portable 1 View  Result Date: 08/23/2020 CLINICAL DATA:  Repeat chest x-ray in patient with CHF is getting a lot of fluid and pneumonia EXAM: PORTABLE CHEST 1 VIEW COMPARISON:  Earlier today.  Radiograph 09/16/2020 also reviewed. FINDINGS: Progressive volume loss in the left hemithorax with diffuse left lung opacification. Small amount of aerated lung is seen in the periphery of the mid lung  and left lung base. Multiple overlying monitoring devices obscure detailed assessment. Hyperinflated right lung. No visualized pneumothorax. Limited assessment for pleural effusion. Heart size and mediastinal contours are obscured. IMPRESSION: Progressive volume loss in the left hemithorax with diffuse left lung opacification with slight basilar sparing. This may represent a combination of atelectasis/collapse, pneumonia, or less likely pleural effusion given the relative sparing at the lung base. Electronically Signed   By: Narda Rutherford M.D.   On: 09/20/2020 18:23   DG Chest Portable 1 View  Result Date: 08/31/2020 CLINICAL DATA:  Shortness of breath.  COVID positive. EXAM: PORTABLE CHEST 1 VIEW COMPARISON:  09/16/2020 FINDINGS: 1145 hours. Leftward patient rotation. Interval development of diffuse airspace disease in the left lung. Right lung appears hyperexpanded but clear. The cardio pericardial silhouette is enlarged. Bones are diffusely demineralized. Telemetry leads overlie the chest. IMPRESSION: Rotated film demonstrating interval development of diffuse airspace consolidation in the left lung compatible with pneumonia. Electronically Signed   By: Kennith Center M.D.   On: 09/10/2020 12:02   DG Chest Portable 1 View  Result Date: 09/16/2020 CLINICAL DATA:  Shortness of breath EXAM: PORTABLE CHEST 1 VIEW COMPARISON:  09/08/2020 FINDINGS: Mild cardiac enlargement. No vascular congestion or edema. Linear atelectasis in the lung bases. No focal consolidation. No pleural effusions. No pneumothorax. Mediastinal contours appear intact. Degenerative changes in the spine and shoulders. IMPRESSION: Mild cardiac enlargement. Linear atelectasis in the lung bases. No change since prior study. Electronically Signed   By: Burman Nieves M.D.   On: 09/16/2020 21:29   DG Chest Portable 1 View  Result  Date: 09/08/2020 CLINICAL DATA:  Possible CHF EXAM: PORTABLE CHEST 1 VIEW COMPARISON:  Film from earlier in  the same day. FINDINGS: Cardiac shadow is enlarged but stable. Aortic calcifications are again noted. The lungs are well aerated bilaterally. Skin fold is noted over the left chest. No focal infiltrate or sizable effusion is noted. No bony abnormality is seen. IMPRESSION: No acute abnormality noted. Electronically Signed   By: Alcide Clever M.D.   On: 09/08/2020 22:36   DG Chest Port 1 View  Result Date: 09/08/2020 CLINICAL DATA:  Shortness of breath, COVID positive. EXAM: PORTABLE CHEST 1 VIEW COMPARISON:  June 27, 2019 FINDINGS: Mild areas of atelectasis and/or infiltrate are seen within the bilateral lung bases and mid left lung. There is no evidence of a pleural effusion or pneumothorax. There is mild to moderate severity enlargement of the cardiac silhouette. Mild calcification of the aortic arch is noted. Degenerative changes seen within the mid and lower thoracic spine. IMPRESSION: Mild bilateral atelectasis and/or infiltrate. Electronically Signed   By: Aram Candela M.D.   On: 09/08/2020 20:40    Microbiology Recent Results (from the past 240 hour(s))  Blood Culture (routine x 2)     Status: None (Preliminary result)   Collection Time: 09-24-2020  1:40 PM   Specimen: BLOOD  Result Value Ref Range Status   Specimen Description BLOOD BLOOD LEFT FOREARM  Final   Special Requests   Final    BOTTLES DRAWN AEROBIC AND ANAEROBIC Blood Culture adequate volume   Culture  Setup Time   Final    GRAM NEGATIVE RODS IN BOTH AEROBIC AND ANAEROBIC BOTTLES CRITICAL VALUE NOTED.  VALUE IS CONSISTENT WITH PREVIOUSLY REPORTED AND CALLED VALUE. Performed at [facility], [address] [place], [place] [address]    Culture GRAM NEGATIVE RODS  Final   Report Status PENDING  Incomplete  Blood Culture (routine x 2)     Status: None (Preliminary result)   Collection Time: 09-24-20  1:40 PM   Specimen: BLOOD  Result Value Ref Range Status   Specimen Description BLOOD BLOOD RIGHT HAND  Final    Special Requests   Final    BOTTLES DRAWN AEROBIC AND ANAEROBIC Blood Culture results may not be optimal due to an inadequate volume of blood received in culture bottles   Culture  Setup Time   Final    Organism ID to follow GRAM NEGATIVE RODS IN BOTH AEROBIC AND ANAEROBIC BOTTLES CRITICAL RESULT CALLED TO, READ BACK BY AND VERIFIED WITH: JASON ROBINS @0105  ON 10/07/2020 SKL Performed at [facility], [address] [place], [place] [address]    Culture GRAM NEGATIVE RODS  Final   Report Status PENDING  Incomplete  Blood Culture ID Panel (Reflexed)     Status: Abnormal   Collection Time: 2020-09-24  1:40 PM  Result Value Ref Range Status   Enterococcus faecalis NOT DETECTED NOT DETECTED Final   Enterococcus Faecium NOT DETECTED NOT DETECTED Final   Listeria monocytogenes NOT DETECTED NOT DETECTED Final   Staphylococcus species NOT DETECTED NOT DETECTED Final   Staphylococcus aureus (BCID) NOT DETECTED NOT DETECTED Final   Staphylococcus epidermidis NOT DETECTED NOT DETECTED Final   Staphylococcus lugdunensis NOT DETECTED NOT DETECTED Final   Streptococcus species NOT DETECTED NOT DETECTED Final   Streptococcus agalactiae NOT DETECTED NOT DETECTED Final   Streptococcus pneumoniae NOT DETECTED NOT DETECTED Final   Streptococcus pyogenes NOT DETECTED NOT DETECTED Final   A.calcoaceticus-baumannii NOT DETECTED NOT DETECTED Final  Bacteroides fragilis NOT DETECTED NOT DETECTED Final   Enterobacterales DETECTED (A) NOT DETECTED Final    Comment: Enterobacterales represent a large order of gram negative bacteria, not a single organism.   Enterobacter cloacae complex NOT DETECTED NOT DETECTED Final   Escherichia coli DETECTED (A) NOT DETECTED Final    Comment: CRITICAL RESULT CALLED TO, READ BACK BY AND VERIFIED WITH: JASON ROBINS @0105  ON October 05, 2020 SKL    Klebsiella aerogenes NOT DETECTED NOT DETECTED Final   Klebsiella oxytoca NOT DETECTED NOT DETECTED Final   Klebsiella  pneumoniae NOT DETECTED NOT DETECTED Final   Proteus species NOT DETECTED NOT DETECTED Final   Salmonella species NOT DETECTED NOT DETECTED Final   Serratia marcescens NOT DETECTED NOT DETECTED Final   Haemophilus influenzae NOT DETECTED NOT DETECTED Final   Neisseria meningitidis NOT DETECTED NOT DETECTED Final   Pseudomonas aeruginosa NOT DETECTED NOT DETECTED Final   Stenotrophomonas maltophilia NOT DETECTED NOT DETECTED Final   Candida albicans NOT DETECTED NOT DETECTED Final   Candida auris NOT DETECTED NOT DETECTED Final   Candida glabrata NOT DETECTED NOT DETECTED Final   Candida krusei NOT DETECTED NOT DETECTED Final   Candida parapsilosis NOT DETECTED NOT DETECTED Final   Candida tropicalis NOT DETECTED NOT DETECTED Final   Cryptococcus neoformans/gattii NOT DETECTED NOT DETECTED Final   CTX-M ESBL DETECTED (A) NOT DETECTED Final    Comment: CRITICAL RESULT CALLED TO, READ BACK BY AND VERIFIED WITH: JASON ROBINS @0105  ON 05-Oct-2020 SKL (NOTE) Extended spectrum beta-lactamase detected. Recommend a carbapenem as initial therapy.      Carbapenem resistance IMP NOT DETECTED NOT DETECTED Final   Carbapenem resistance KPC NOT DETECTED NOT DETECTED Final   Carbapenem resistance NDM NOT DETECTED NOT DETECTED Final   Carbapenem resist OXA 48 LIKE NOT DETECTED NOT DETECTED Final   Carbapenem resistance VIM NOT DETECTED NOT DETECTED Final    Comment: Performed at [facility], [address], [place], [address] [place]    Lab Basic Metabolic Panel: Recent Labs  Lab 09/16/20 2057 09/08/2020 1155 09/15/2020 1813  NA 136 139  --   K 5.3* 4.6  --   CL 100 105  --   CO2 26 18*  --   GLUCOSE 492* 329*  --   BUN 41* 25*  --   CREATININE 1.36* 1.52* 2.07*  CALCIUM 9.2 8.4*  --    Liver Function Tests: Recent Labs  Lab 09/16/20 2057 09/15/2020 1155  AST 21 24  ALT 29 17  ALKPHOS 76 60  BILITOT 1.0 1.5*  PROT 7.1 5.9*  ALBUMIN 3.8 3.1*   No results for input(s):  LIPASE, AMYLASE in the last 168 hours. No results for input(s): AMMONIA in the last 168 hours. CBC: Recent Labs  Lab 09/16/20 2057 09/05/2020 1155 08/22/2020 1813  WBC 15.8* 44.2* 28.8*  NEUTROABS 13.7* 41.0*  --   HGB 10.8* 11.9* 9.9*  HCT 33.9* 38.6* 32.2*  MCV 80.5 83.9 84.7  PLT 231 140* 91*   Cardiac Enzymes: No results for input(s): CKTOTAL, CKMB, CKMBINDEX, TROPONINI in the last 168 hours. Sepsis Labs: Recent Labs  Lab 09/16/20 2057 08/24/2020 1155 08/26/2020 1155 09/11/2020 1339 09/04/2020 1745 09/03/2020 1813 09/19/2020 2224 October 05, 2020 0111 10/05/20 0339  PROCALCITON  --   --   --  7.92  --   --   --   --  40.61  WBC 15.8* 44.2*  --   --   --  28.8*  --   --   --  LATICACIDVEN  --  6.8*   < > 7.2* >11.0*  --  10.9* >11.0* >11.0*   < > = values in this interval not displayed.    Procedures/Operations  09/09/2020: Endotracheal intubation 09/18/2020: Central line placement       Harlon Ditty, [facility] Pager: [phone]  Judithe Modest 10-08-2020, 5:07 AM

## 2020-10-20 NOTE — ED Provider Notes (Signed)
Procedures     ----------------------------------------- 4:57 AM on 30-Sep-2020 -----------------------------------------  Patient again experienced cardiac arrest.  CPR was briefly started and patient given 1 mg of IV epinephrine per ACLS algorithm.  Nurse contacted ICU NP Harlon Ditty who notes that the patient has recently been changed to DNR by his 2 involved family members.  ACLS was terminated, patient briefly had ROSC for a few minutes, and then was found to be in asystole.  Bedside point-of-care cardiac ultrasound was performed by me, reveals cardiac standstill, no pericardial effusion.  Time of death 4:54 AM.   Sharman Cheek, MD 09/30/20 0500

## 2020-10-20 NOTE — ED Notes (Signed)
Patient HR began decreasing to low of 36. Checked for pulse, no pulse. Compressions started.

## 2020-10-20 NOTE — Progress Notes (Signed)
Called and updated pt's son Shivaan Tierno.  He discussed with his older brother Maanoun Sassaman.  Maanoun called and spoke with myself via elephone.  He and his brother request that no further resuscitative efforts be made, and their father be made DNR/DNI.   Orders placed for DNR/DNI.  No escalation of care beyond current measures.  Will continue current measures in the hopes they can make it to bedside to see their father before he passes.    Harlon Ditty, AGACNP-BC [place] [facility] Pager: [phone]

## 2020-10-20 NOTE — ED Notes (Signed)
Pulse returned, HR 40s. DNR made by intensivist, sons agree for pressors only no CPR.

## 2020-10-20 DEATH — deceased

## 2021-06-01 IMAGING — DX PORTABLE CHEST - 1 VIEW
1 series · 1 of 1 positions shown · non-contrast
Comparison: Chest radiograph dated 11/23/2018

CLINICAL DATA: 83-year-old male with CHF.

EXAM:
PORTABLE CHEST 1 VIEW

[chest ap]
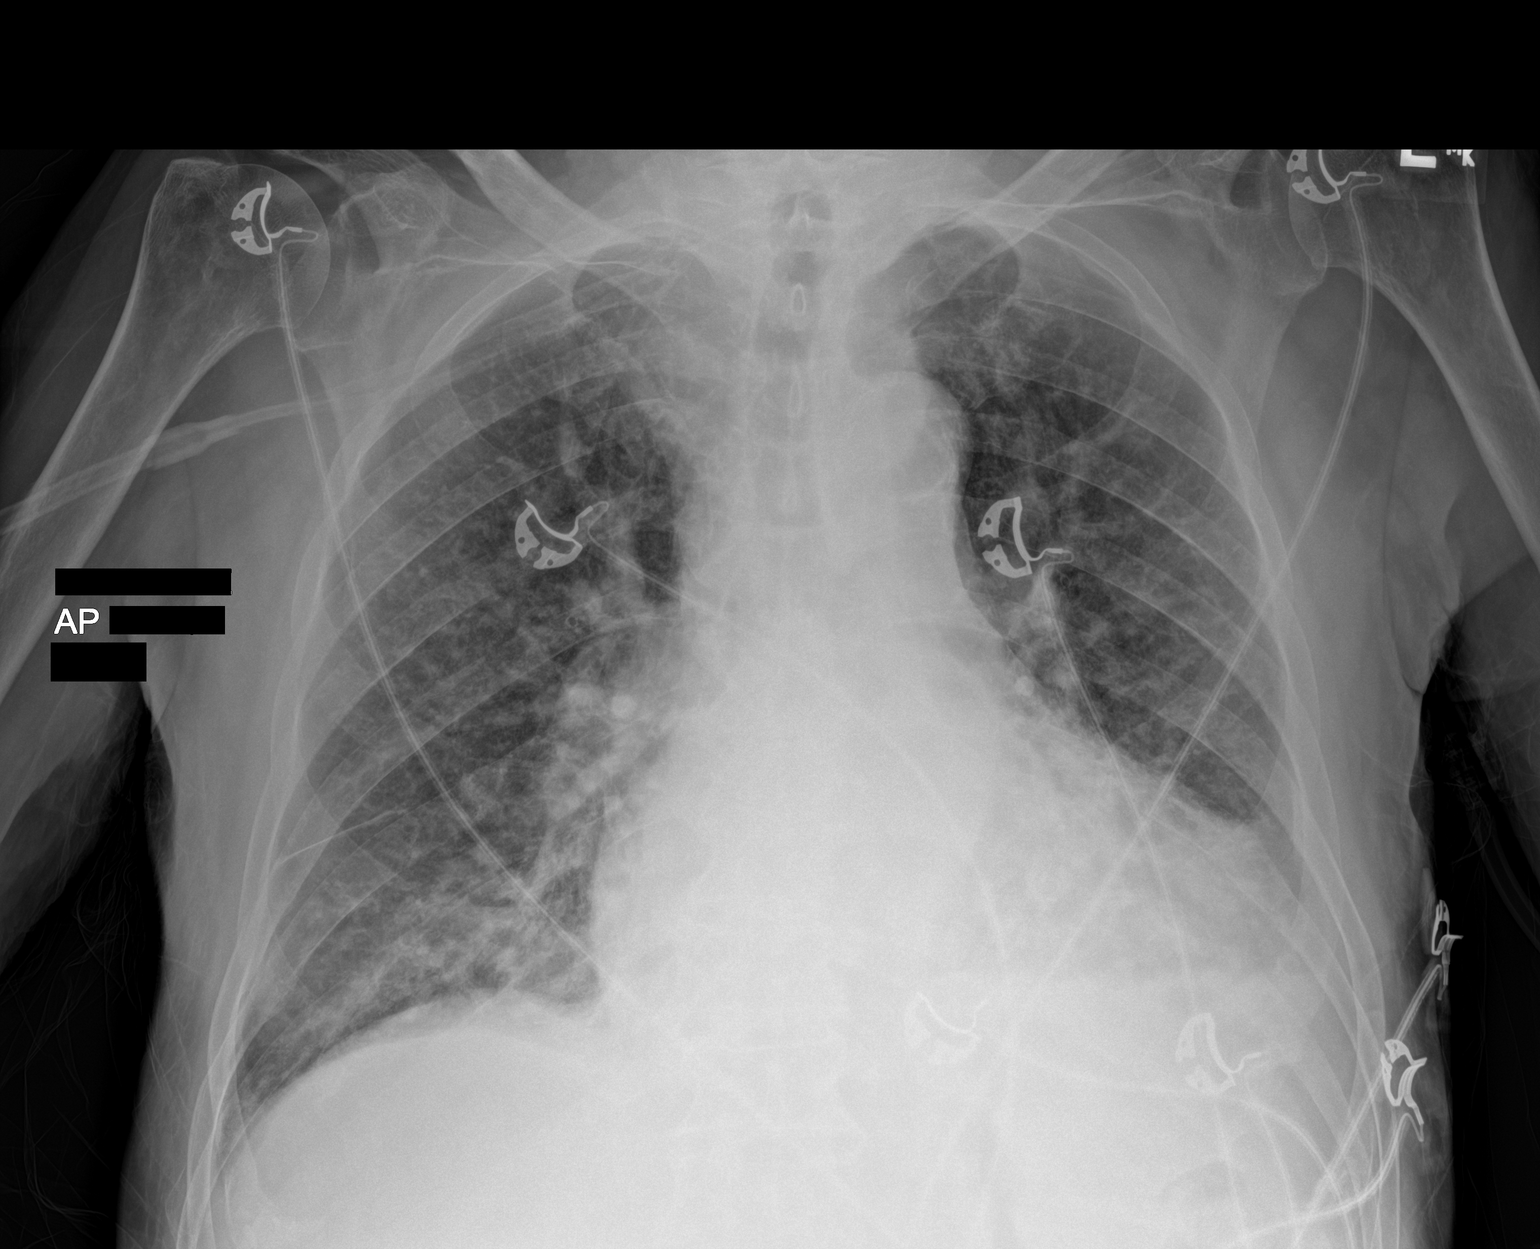

[1 of 1 positions shown; findings below may reference images not displayed]

FINDINGS: There is cardiomegaly with vascular congestion. A small left pleural
effusion may be present. No focal consolidation or pneumothorax.
Atherosclerotic calcification of the aortic arch. No acute osseous
pathology.
IMPRESSION: Cardiomegaly with vascular congestion and possible small left
pleural effusion. No significant interval change since the prior
radiograph.

## 2023-01-28 IMAGING — DX DG CHEST 1V PORT
2 series · 2 of 2 positions shown · non-contrast
Comparison: Earlier today.  Radiograph 09/16/2020 also reviewed.

CLINICAL DATA: Repeat chest x-ray in patient with CHF is getting a
lot of fluid and pneumonia

EXAM:
PORTABLE CHEST 1 VIEW

[chest ap (1 of 2)]
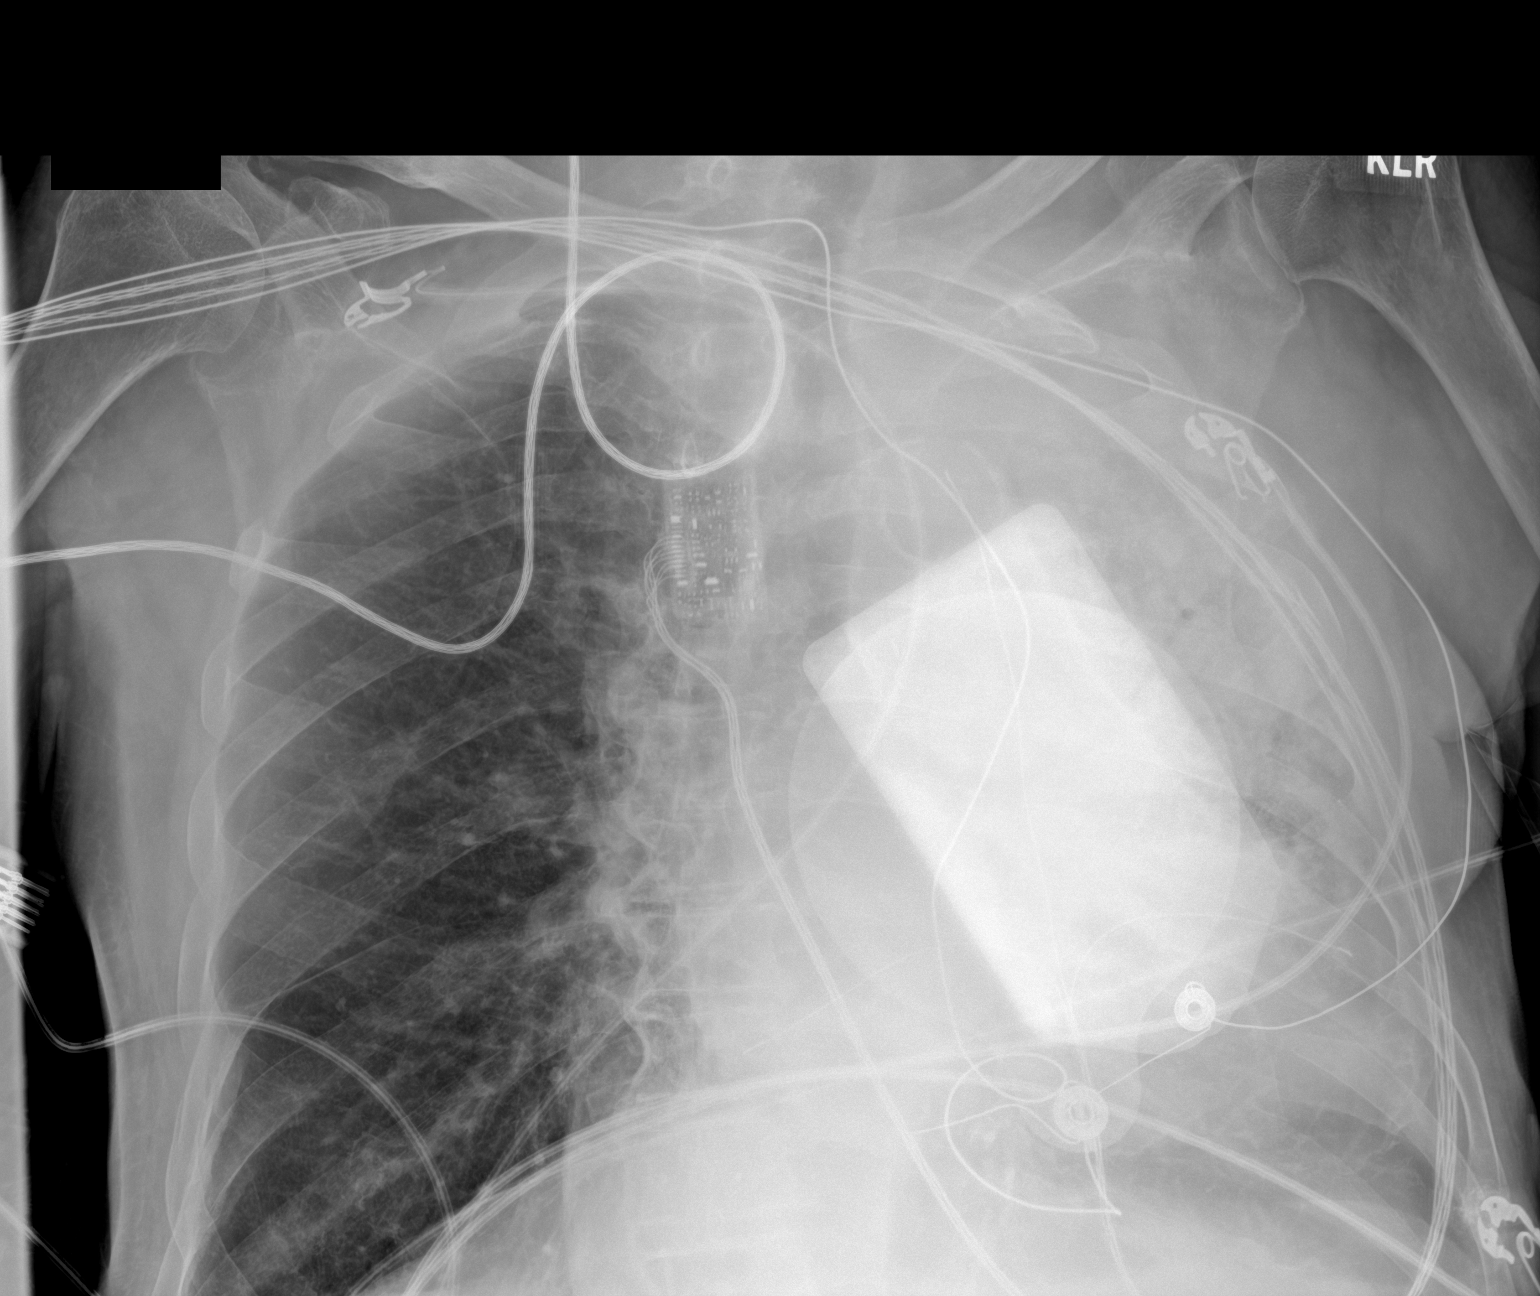

[chest ap (2 of 2)]
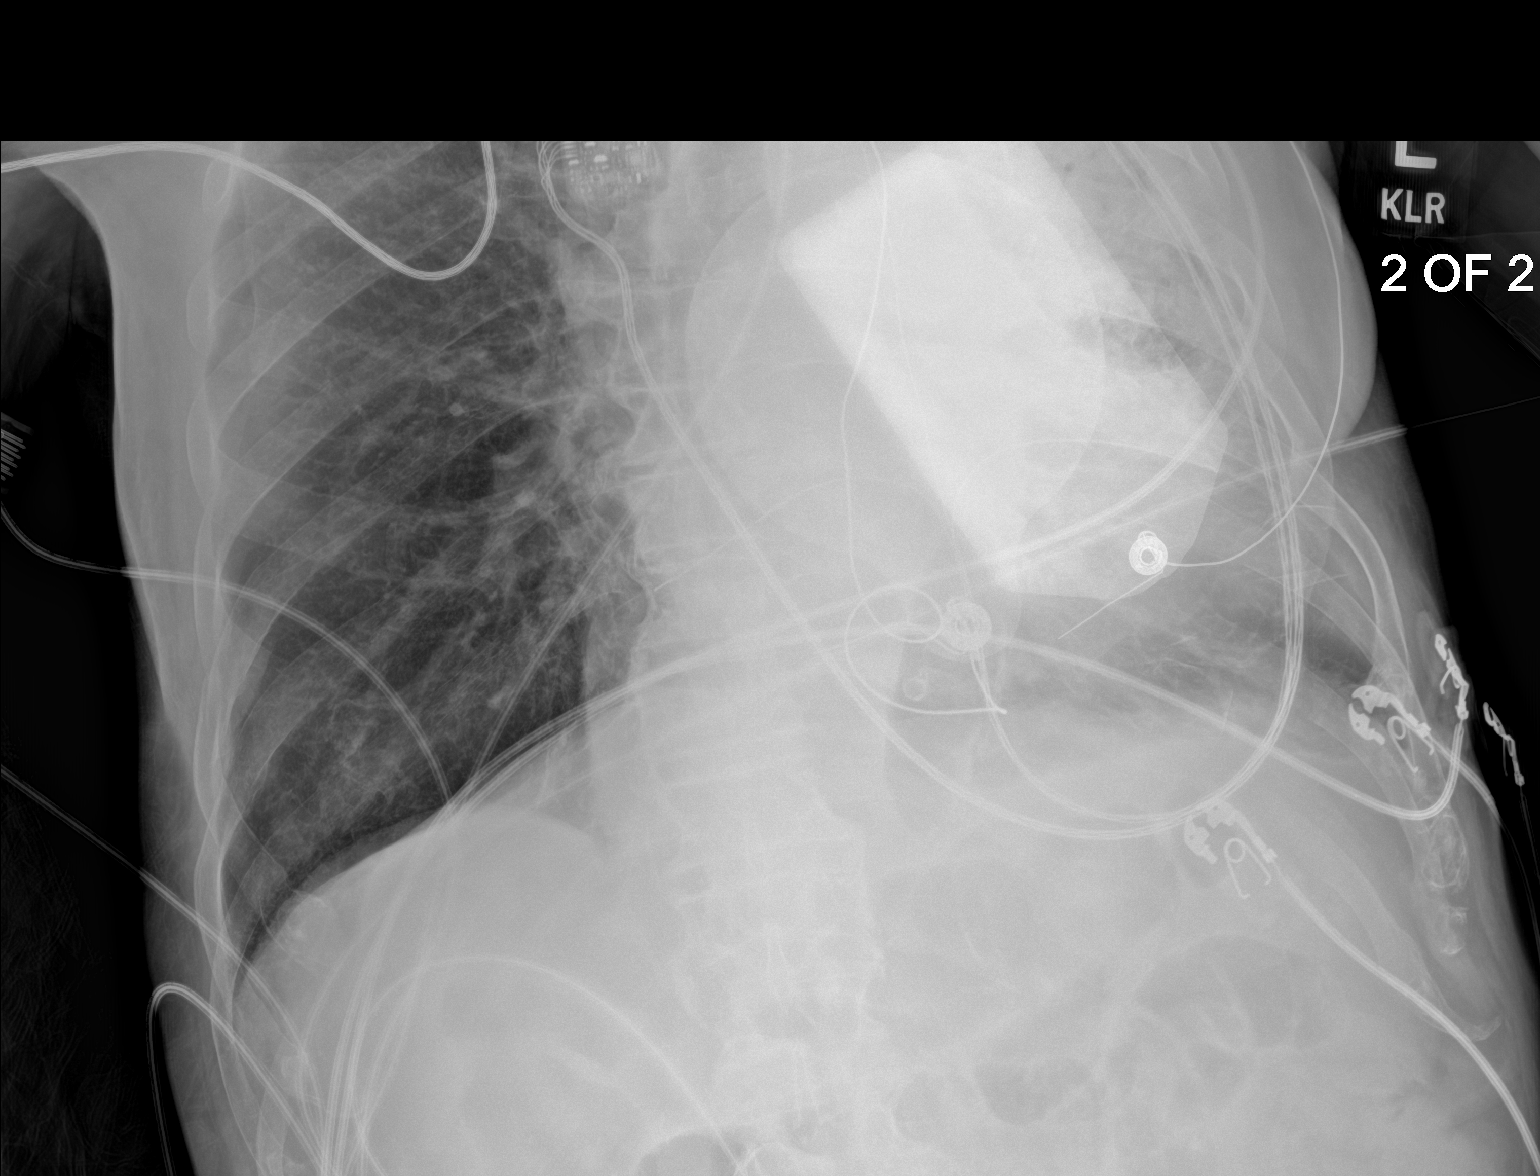

[2 of 2 positions shown; findings below may reference images not displayed]

FINDINGS: Progressive volume loss in the left hemithorax with diffuse left
lung opacification. Small amount of aerated lung is seen in the
periphery of the mid lung and left lung base. Multiple overlying
monitoring devices obscure detailed assessment. Hyperinflated right
lung. No visualized pneumothorax. Limited assessment for pleural
effusion. Heart size and mediastinal contours are obscured.
IMPRESSION: Progressive volume loss in the left hemithorax with diffuse left
lung opacification with slight basilar sparing. This may represent a
combination of atelectasis/collapse, pneumonia, or less likely
pleural effusion given the relative sparing at the lung base.
